# Patient Record
Sex: Male | Born: 1950 | Race: White | Hispanic: No | Marital: Married | Smoking: Never smoker
Health system: Southern US, Community
[De-identification: ages and names within clinical notes are randomized; demographics above are authoritative.]

## PROBLEM LIST (undated history)

## (undated) DIAGNOSIS — I10 Essential (primary) hypertension: Secondary | ICD-10-CM

## (undated) DIAGNOSIS — E785 Hyperlipidemia, unspecified: Secondary | ICD-10-CM

## (undated) DIAGNOSIS — G4730 Sleep apnea, unspecified: Secondary | ICD-10-CM

## (undated) DIAGNOSIS — M545 Low back pain, unspecified: Secondary | ICD-10-CM

## (undated) DIAGNOSIS — T7840XA Allergy, unspecified, initial encounter: Secondary | ICD-10-CM

## (undated) DIAGNOSIS — K219 Gastro-esophageal reflux disease without esophagitis: Secondary | ICD-10-CM

## (undated) DIAGNOSIS — F419 Anxiety disorder, unspecified: Secondary | ICD-10-CM

## (undated) DIAGNOSIS — F5104 Psychophysiologic insomnia: Secondary | ICD-10-CM

## (undated) DIAGNOSIS — M1990 Unspecified osteoarthritis, unspecified site: Secondary | ICD-10-CM

## (undated) DIAGNOSIS — N419 Inflammatory disease of prostate, unspecified: Secondary | ICD-10-CM

## (undated) DIAGNOSIS — H269 Unspecified cataract: Secondary | ICD-10-CM

## (undated) HISTORY — DX: Allergy, unspecified, initial encounter: T78.40XA

## (undated) HISTORY — DX: Sleep apnea, unspecified: G47.30

## (undated) HISTORY — DX: Psychophysiologic insomnia: F51.04

## (undated) HISTORY — PX: POLYPECTOMY: SHX149

## (undated) HISTORY — DX: Unspecified osteoarthritis, unspecified site: M19.90

## (undated) HISTORY — DX: Gastro-esophageal reflux disease without esophagitis: K21.9

## (undated) HISTORY — DX: Essential (primary) hypertension: I10

## (undated) HISTORY — DX: Low back pain, unspecified: M54.50

## (undated) HISTORY — DX: Inflammatory disease of prostate, unspecified: N41.9

## (undated) HISTORY — PX: WISDOM TOOTH EXTRACTION: SHX21

## (undated) HISTORY — DX: Unspecified cataract: H26.9

## (undated) HISTORY — DX: Low back pain: M54.5

## (undated) HISTORY — DX: Anxiety disorder, unspecified: F41.9

## (undated) HISTORY — PX: SEPTOPLASTY: SUR1290

## (undated) HISTORY — DX: Hyperlipidemia, unspecified: E78.5

## (undated) HISTORY — PX: COLONOSCOPY: SHX174

---

## 1998-05-25 ENCOUNTER — Emergency Department (HOSPITAL_COMMUNITY): Admission: EM | Admit: 1998-05-25 | Discharge: 1998-05-25 | Payer: Self-pay | Admitting: Emergency Medicine

## 2002-09-24 ENCOUNTER — Emergency Department (HOSPITAL_COMMUNITY): Admission: EM | Admit: 2002-09-24 | Discharge: 2002-09-24 | Payer: Self-pay | Admitting: Emergency Medicine

## 2002-10-10 ENCOUNTER — Encounter: Payer: Self-pay | Admitting: Orthopedic Surgery

## 2002-10-10 ENCOUNTER — Ambulatory Visit (HOSPITAL_COMMUNITY): Admission: RE | Admit: 2002-10-10 | Discharge: 2002-10-10 | Payer: Self-pay | Admitting: Orthopedic Surgery

## 2004-09-15 ENCOUNTER — Ambulatory Visit: Payer: Self-pay | Admitting: Gastroenterology

## 2004-09-16 ENCOUNTER — Encounter: Admission: RE | Admit: 2004-09-16 | Discharge: 2004-09-16 | Payer: Self-pay | Admitting: Gastroenterology

## 2004-09-16 ENCOUNTER — Encounter (INDEPENDENT_AMBULATORY_CARE_PROVIDER_SITE_OTHER): Payer: Self-pay

## 2004-09-16 ENCOUNTER — Encounter (INDEPENDENT_AMBULATORY_CARE_PROVIDER_SITE_OTHER): Payer: Self-pay | Admitting: *Deleted

## 2004-09-17 ENCOUNTER — Inpatient Hospital Stay (HOSPITAL_COMMUNITY): Admission: EM | Admit: 2004-09-17 | Discharge: 2004-09-19 | Payer: Self-pay

## 2004-09-17 ENCOUNTER — Ambulatory Visit: Payer: Self-pay | Admitting: Gastroenterology

## 2007-01-19 ENCOUNTER — Ambulatory Visit: Payer: Self-pay | Admitting: Gastroenterology

## 2007-02-17 ENCOUNTER — Ambulatory Visit: Payer: Self-pay | Admitting: Gastroenterology

## 2007-02-17 ENCOUNTER — Encounter (INDEPENDENT_AMBULATORY_CARE_PROVIDER_SITE_OTHER): Payer: Self-pay | Admitting: *Deleted

## 2007-02-17 LAB — HM COLONOSCOPY: HM Colonoscopy: NEGATIVE

## 2010-10-02 ENCOUNTER — Telehealth: Payer: Self-pay | Admitting: Internal Medicine

## 2010-10-03 ENCOUNTER — Other Ambulatory Visit: Payer: Self-pay | Admitting: Physician Assistant

## 2010-10-03 ENCOUNTER — Ambulatory Visit
Admission: RE | Admit: 2010-10-03 | Discharge: 2010-10-03 | Payer: Self-pay | Source: Home / Self Care | Attending: Internal Medicine | Admitting: Internal Medicine

## 2010-10-03 DIAGNOSIS — K2100 Gastro-esophageal reflux disease with esophagitis, without bleeding: Secondary | ICD-10-CM | POA: Insufficient documentation

## 2010-10-03 DIAGNOSIS — E785 Hyperlipidemia, unspecified: Secondary | ICD-10-CM | POA: Insufficient documentation

## 2010-10-03 DIAGNOSIS — R1319 Other dysphagia: Secondary | ICD-10-CM | POA: Insufficient documentation

## 2010-10-03 DIAGNOSIS — I10 Essential (primary) hypertension: Secondary | ICD-10-CM | POA: Insufficient documentation

## 2010-10-03 DIAGNOSIS — R1012 Left upper quadrant pain: Secondary | ICD-10-CM | POA: Insufficient documentation

## 2010-10-03 DIAGNOSIS — R109 Unspecified abdominal pain: Secondary | ICD-10-CM | POA: Insufficient documentation

## 2010-10-03 DIAGNOSIS — R1310 Dysphagia, unspecified: Secondary | ICD-10-CM | POA: Insufficient documentation

## 2010-10-03 DIAGNOSIS — R110 Nausea: Secondary | ICD-10-CM | POA: Insufficient documentation

## 2010-10-03 DIAGNOSIS — K219 Gastro-esophageal reflux disease without esophagitis: Secondary | ICD-10-CM | POA: Insufficient documentation

## 2010-10-03 DIAGNOSIS — A09 Infectious gastroenteritis and colitis, unspecified: Secondary | ICD-10-CM | POA: Insufficient documentation

## 2010-10-03 DIAGNOSIS — R197 Diarrhea, unspecified: Secondary | ICD-10-CM | POA: Insufficient documentation

## 2010-10-03 DIAGNOSIS — R1013 Epigastric pain: Secondary | ICD-10-CM | POA: Insufficient documentation

## 2010-10-03 LAB — CBC WITH DIFFERENTIAL/PLATELET
Basophils Absolute: 0 10*3/uL (ref 0.0–0.1)
Basophils Relative: 0.5 % (ref 0.0–3.0)
Eosinophils Absolute: 0.4 10*3/uL (ref 0.0–0.7)
Eosinophils Relative: 5.9 % — ABNORMAL HIGH (ref 0.0–5.0)
HCT: 48 % (ref 39.0–52.0)
Hemoglobin: 17 g/dL (ref 13.0–17.0)
Lymphocytes Relative: 15.8 % (ref 12.0–46.0)
Lymphs Abs: 1.2 10*3/uL (ref 0.7–4.0)
MCHC: 35.4 g/dL (ref 30.0–36.0)
MCV: 91.4 fl (ref 78.0–100.0)
Monocytes Absolute: 0.9 10*3/uL (ref 0.1–1.0)
Monocytes Relative: 11.6 % (ref 3.0–12.0)
Neutro Abs: 4.9 10*3/uL (ref 1.4–7.7)
Neutrophils Relative %: 66.2 % (ref 43.0–77.0)
Platelets: 233 10*3/uL (ref 150.0–400.0)
RBC: 5.25 Mil/uL (ref 4.22–5.81)
RDW: 12.8 % (ref 11.5–14.6)
WBC: 7.5 10*3/uL (ref 4.5–10.5)

## 2010-10-03 LAB — LIPASE: Lipase: 22 U/L (ref 11.0–59.0)

## 2010-10-03 LAB — BASIC METABOLIC PANEL
BUN: 15 mg/dL (ref 6–23)
CO2: 28 mEq/L (ref 19–32)
Calcium: 9.3 mg/dL (ref 8.4–10.5)
Chloride: 97 mEq/L (ref 96–112)
Creatinine, Ser: 1.2 mg/dL (ref 0.4–1.5)
GFR: 69.54 mL/min (ref >60.00)
Glucose, Bld: 92 mg/dL (ref 70–99)
Potassium: 4.4 mEq/L (ref 3.5–5.1)
Sodium: 134 mEq/L — ABNORMAL LOW (ref 135–145)

## 2010-10-06 ENCOUNTER — Encounter: Payer: Self-pay | Admitting: Physician Assistant

## 2010-10-07 ENCOUNTER — Telehealth (INDEPENDENT_AMBULATORY_CARE_PROVIDER_SITE_OTHER): Payer: Self-pay | Admitting: *Deleted

## 2010-10-07 ENCOUNTER — Encounter: Payer: Self-pay | Admitting: Physician Assistant

## 2010-10-27 ENCOUNTER — Ambulatory Visit
Admission: RE | Admit: 2010-10-27 | Discharge: 2010-10-27 | Payer: Self-pay | Source: Home / Self Care | Attending: Internal Medicine | Admitting: Internal Medicine

## 2010-10-30 NOTE — Progress Notes (Signed)
  Phone Note Outgoing Call Call back at Work Phone [phone]   Call placed by: Jesse Fall, RN Call placed to: Patient Summary of Call: Called to give patient stool culture results as per Mike Gip, PA. He continues to have diarrhea 3-4 times/day. Stools are watery. Denies any bleeding. Pain/cramps are better. Initial call taken by: Jesse Fall RN,  October 07, 2010 3:47 PM  Follow-up for Phone Call        HE SHOULD FINISH FLORASTOR AND FLAGYL, OK TO TAKE IMMODIUM  AS NEEDED FOR DIARRHEA-KEEP APPT WITH DR. PERRY ON 10/27/10 Follow-up by: Sammuel Cooper PA-c,  October 10, 2010 1:49 PM  Additional Follow-up for Phone Call Additional follow up Details #1::        Patient given recommendations per Mike Gip, PA. Additional Follow-up by: Jesse Fall RN,  October 10, 2010 1:56 PM

## 2010-10-30 NOTE — Miscellaneous (Signed)
Summary: CT SCAN ABDOMEN/PELVIS  Clinical Lists Changes  CT Pelvis W/CM - STATUS: Final  IMAGE                                     Perform Date: 20Dec05 17:33  Ordered By: Dorene Ar,        Ordered Date: 20Dec05 16:04  Facility: [facility]                              Department: CT  Service Report Text  [facility] Accession Number: [id number]    Clinical Data:  Abdominal pain.  Nausea and vomiting.   Technique:  100 cc Omnipaque 300.   CT ABDOMEN WITH CONTRAST (17:45 HOURS):   The liver, gallbladder, spleen, adrenal glands, and pancreas are   within normal limits.  The kidneys are within normal limits.  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  Nonspecific   air fluid levels are present in the colon. There is no obvious   inflammatory change of the colon. The small bowel is decompressed.   IMPRESSION:   1.  No evidence of acute intraabdominal pathology.   2.  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  IVC filter placement would be problematic.   CT PELVIS:   Mild sigmoid diverticulosis without diverticulitis is seen.  The   bladder is within normal limits. The prostate is prominent measuring   5.5 cm.  Negative free fluid.  Negative for adenopathy.   IMPRESSION:   1.  Mild prominence of the prostate gland.   2.  Otherwise no acute pelvic pathology.     Read By:  Jolaine Click,  M.D.   Released By:  Jolaine Click,  M.D.  Additional Information  External image : [id number]

## 2010-10-30 NOTE — Procedures (Signed)
Summary: EGD  Patient Name: Adam Villanueva, Adam Villanueva. MRN:  Procedure Procedures: Panendoscopy (EGD) CPT: 43235.  Personnel: Endoscopist: Ulyess Mort, MD.  Exam Location: Exam performed in Outpatient Clinic. Outpatient  Patient Consent: Procedure, Alternatives, Risks and Benefits discussed, consent obtained, from patient. Consent was obtained by the RN.  Indications Symptoms: Dyspepsia, Reflux symptoms  History  Current Medications: Patient is not currently taking Coumadin.  Pre-Exam Physical: Entire physical exam was normal.  Comments: Pt. history reviewed/updated, physical exam performed prior to initiation of sedation? Exam Exam Info: Maximum depth of insertion Duodenum, intended Duodenum. Patient position: on left side. Vocal cords visualized. Gastric retroflexion performed. Images taken. ASA Classification: II. Tolerance: good.  Sedation Meds: Patient assessed and found to be appropriate for moderate (conscious) sedation. Fentanyl 50 mcg. given IV. Versed 5 mg. given IV. Cetacaine Spray 2 sprays given aerosolized.  Monitoring: BP and pulse monitoring done. Oximetry used. Supplemental O2 given  Findings - HIATAL HERNIA: 2 cms. in length. ICD9: GERD: 530.81. - MUCOSAL ABNORMALITY: Body to Antrum. Erythematous mucosa. Granular mucosa. RUT done, results pending.  - MUCOSAL ABNORMALITY: Pyloric Sphincter to Jejunum. Granular mucosa.   Assessment Abnormal examination, see findings above.  Diagnoses: 530.81: GERD.   Events  Unplanned Intervention: No unplanned interventions were required.  Unplanned Events: There were no complications. Plans Medication(s): Continue current medications. PPI:   Patient Education: Patient given standard instructions for: Hiatal Hernia. Reflux. Mucosal Abnormality.  Disposition: After procedure patient sent to recovery. After recovery patient sent home.   Ulyess Mort, MD  This report was created from the original  endoscopy report, which was reviewed and signed by the above listed endoscopist.

## 2010-10-30 NOTE — Assessment & Plan Note (Signed)
Summary: Abdominal pain/Nausea/Diarrhea (Dr. Marina Goodell patient)   History of Present Illness Visit Type: Initial Visit Primary GI MD: Yancey Flemings MD Primary Provider: Burton Apley MD Chief Complaint: epigastric pain, nausea History of Present Illness:   Adam 60 YO Villanueva KNOWN TO DR. PERRY-LAST SEEN AT TIME OF COLONOSCOPY IN 5/08 WHICH WAS NORMAL HE ALSO HAD EGD AT THAT TIME SHOWING GASTRITIS,SMALL HIATAL HERNIA. HE HAS BEEN ON LONG TERM PPI RX FOR GERD SXS. HE SAYS HE STARTED HAVING SHOOTING PAINS IN HIS EPIGASTRIUM ABOUT 2 WEEKS AGO., ALONG WITH SOME DIARRHEA. HIS APPETITE HAS BEEN OK, BUT HAS SOME INTERMITTENT NAUSEA. NO VOMIITNG. . HE C/O CRAMPY ABDOMINAL PAIN, AND GENERALLY IS HAVING 4-5 LOOSE MALODOROUS STOOLS PER DAY. NO FEVR OR CHILLS.  HE ALSO C/O INCREASED REFLUX SXS, AND HAS HAD SOME SOLID FOOD DYSPHAGIA FOR A COUPLE MONTHS.  HE HAS BEEN ON ASA  A COUPLE DAYS PER WEEK, AND TAKES 2-3 ALEVE MOST DAYS FOR BACK PAIN. HE ALSO TOOK A COURSE OF CIPRO FOR WHAT SOUNDS LIKE EPIDYMITIS ABOUT 2 1/2 WEEKS AGO, AND SXS STARTED AFTER THAT.   GI Review of Systems    Reports abdominal pain, belching, bloating, chest pain, heartburn, and  nausea.     Location of  Abdominal pain: epigastric area.    Denies acid reflux, dysphagia with liquids, dysphagia with solids, loss of appetite, vomiting, vomiting blood, weight loss, and  weight gain.      Reports diarrhea.     Denies anal fissure, black tarry stools, change in bowel habit, constipation, diverticulosis, fecal incontinence, heme positive stool, hemorrhoids, irritable bowel syndrome, jaundice, light color stool, liver problems, rectal bleeding, and  rectal pain.    Current Medications (verified): 1)  Atenolol 50 Mg Tabs (Atenolol) .Marland Kitchen.. 1 By Mouth Once Daily 2)  Hydrochlorothiazide 25 Mg Tabs (Hydrochlorothiazide) .Marland Kitchen.. 1 By Mouth Once Daily 3)  Lisinopril 40 Mg Tabs (Lisinopril) .Marland Kitchen.. 1 By Mouth Once Daily 4)  Protonix 40 Mg Tbec (Pantoprazole  Sodium) .Marland Kitchen.. 1 By Mouth Once Daily 5)  Simvastatin 20 Mg Tabs (Simvastatin) .Marland Kitchen.. 1 By Mouth Once Daily 6)  Amitriptyline Hcl 50 Mg Tabs (Amitriptyline Hcl) .Marland Kitchen.. 1 By Mouth Once Daily 7)  Metoclopramide Hcl 10 Mg Tabs (Metoclopramide Hcl) .Marland Kitchen.. 1 By Mouth Two Times A Day 8)  Aleve 220 Mg Tabs (Naproxen Sodium) .... As Needed 9)  Fish Oil 1000 Mg Caps (Omega-3 Fatty Acids) .Marland Kitchen.. 1 By Mouth Once Daily  Allergies (verified): No Known Drug Allergies  Past History:  Past Medical History: Anxiety Disorder Arthritis Hyperlipidemia Hypertension  Past Surgical History: Back Surgery COLONOSCOPY 5/08 DR PERRY  Family History: Family History of Esophageal Cancer: brother Family History of Heart Disease: mother, grandparents No FH of Colon Cancer:  Social History: Patient has never smoked.  Alcohol Use - yes 1 per day Daily Caffeine Use 3 per day Illicit Drug Use - no Patient does not get regular exercise.  Smoking Status:  never Drug Use:  no Does Patient Exercise:  no  Review of Systems  The patient denies allergy/sinus, anemia, anxiety-new, arthritis/joint pain, back pain, blood in urine, breast changes/lumps, change in vision, confusion, cough, coughing up blood, depression-new, fainting, fatigue, fever, headaches-new, hearing problems, heart murmur, heart rhythm changes, itching, menstrual pain, muscle pains/cramps, night sweats, nosebleeds, pregnancy symptoms, shortness of breath, skin rash, sleeping problems, sore throat, swelling of feet/legs, swollen lymph glands, thirst - excessive , urination - excessive , urination changes/pain, urine leakage, vision changes, and voice change.  SEE HPI  Vital Signs:  Patient profile:   60 year old Villanueva Height:      70 inches Weight:      187 pounds BMI:     26.93 Pulse rate:   66 / minute Pulse rhythm:   regular BP sitting:   126 / 70  (left arm)  Vitals Entered By: Chales Abrahams CMA Duncan Dull) (October 03, 2010 8:57 AM)  Physical  Exam  General:  Well developed, well nourished, no acute distress. Head:  Normocephalic and atraumatic. Eyes:  PERRLA, no icterus. Lungs:  Clear throughout to auscultation. Heart:  Regular rate and rhythm; no murmurs, rubs,  or bruits. Abdomen:  SOFT, TENDER MILDY ACROSS LOWER ABDOMEN,BS HYPERACTIVE, NO GUARDING, NO MASS OR HSM,BS+ Rectal:  NOT DONE Neurologic:  Alert and  oriented x4;  grossly normal neurologically. Psych:  Alert and cooperative. Normal mood and affect.   Impression & Recommendations:  Problem # 1:  DIARRHEA (ICD-787.91) Assessment New 60 YO Villanueva WITH 2 WEEK HX OF MULTIPLE GI SXS WITH ABDOMINAL CRAMPING, DIARRHEA, NAUSEA, INCREASED REFLUX SXS-ALL AFTER TAKING A COURSE OF CIPRO. R/O C.DIFF.  LABS AS BELOW STOOL STUDIS A S BELOW START FLORASTOR TWICE DAILY START FLAGYL 250 MG 4 TIMES DAILY X 2 WEEKS PENDING CDIFF PCR. 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 ROV WITH MYSELF OR DR. PERRY IN 2 WEEKS  Problem # 3:  HYPERTENSION (ICD-401.9) Assessment: Comment Only  Problem # 4:  HYPERLIPIDEMIA (ICD-272.4) Assessment: Comment Only  Other Orders: T-Culture, Stool (87045/[id number]) T-C diff by PCR (54098) T-Fecal WBC (11914-78295) TLB-CBC Platelet - w/Differential (85025-CBCD) TLB-BMP (Basic Metabolic Panel-BMET) (80048-METABOL) TLB-Lipase (83690-LIPASE)  Patient Instructions: 1)  Please go to lab, basement level. 2)  Sent prescriptions to [facility] [address] for [employer] and Flagyl. 3)  We gave you Nexium samples, take 1 twice daily for 14 days. 4)  We made you a follow up appointment with Dr. Marina Goodell for 10-27-2010. 5)  Appointment card given. 6)  Call us at [phone]  if you need to be seen sooner. 7)  Copy sent to : Burton Apley, MD 8)  The medication list was reviewed and reconciled.  All changed / newly prescribed medications were explained.  A complete medication list was provided to the patient / caregiver. Prescriptions: FLORASTOR 250 MG CAPS (SACCHAROMYCES BOULARDII) Take 1 tab twice daily x 14 days  #28 x 0   Entered by:   Lowry Ram NCMA   Authorized by:   Sammuel Cooper PA-c   Signed by:   Lowry Ram NCMA on 10/03/2010   Method used:   Electronically to        [facility]  [address] # [id number]* (retail)       [address]       [place], [place]  [address]       Ph: [phone] or [phone]       Fax: [phone]   RxID:   [id number] FLAGYL 250 MG TABS (METRONIDAZOLE) Take 1 tab 4 times daily x 14 days  #56 x 0   Entered by:   Lowry Ram NCMA   Authorized by:  Brieonna Crutcher S Ottis Sarnowski PA-c   Signed by:   Lowry Ram NCMA on 10/03/2010   Method used:   Electronically to        [facility] # [id number]* (retail)       [address]       [place], [place]  [address]       Ph: [phone] or [phone]       Fax: [phone]   RxID:   [id number]

## 2010-10-30 NOTE — Miscellaneous (Signed)
Summary: Abdomen xray 2 view  Clinical Lists Changes     DG Abd 2V. - STATUS: Final  IMAGE                                     Perform Date: 20Dec05 16:22  Ordered By: Dorene Ar,        Ordered Date: 20Dec05 16:13  Facility: [facility]                              Department: DG  Service Report Text  DRI Accession Number: [id number]    Clinical Data: Upper abdominal pain, nausea and vomiting for one week.    ABDOMEN - 2 VIEW    Comparison:  None.    Findings:  Multiple air-fluid levels in normal caliber right and   transverse   colon. Air-fluid levels in normal caliber colon. CT oral contrast in   the right   and transverse colon and small bowel. No bowel dilatation seen and no   gross   evidence of free peritoneal air. Minimal levoconvex lumbar scoliosis.    IMPRESSION    Multiple air-fluid levels in normal caliber colon. This could be due   to the   ingested CT oral contrast. This can also be seen with gastroenteritis.    Read By:  Darrol Angel,  M.D.   Released By:  Darrol Angel,  M.D.  Additional Information  External image : [id number]

## 2010-10-30 NOTE — Progress Notes (Signed)
Summary: Doc of the day  Phone Note Call from Patient Call back at Work Phone [phone]   Caller: Patient Call For: Dr. Leone Payor Reason for Call: Talk to Nurse Summary of Call: Abd pain and nausea...requesting sooner appt. than next avail Initial call taken by: Karna Christmas,  October 02, 2010 3:25 PM  Follow-up for Phone Call        Spoke with patient and he is having problems with gas, abdominal pain and nausea. States that he was a patient of Dr. Victorino Dike years ago.Appointment made for patient to see Mike Gip, [place] 10/03/10 @9am . Follow-up by: Selinda Michaels RN,  October 02, 2010 3:50 PM

## 2010-11-05 NOTE — Assessment & Plan Note (Signed)
Summary: F/U ABD pain, nausea, saw Amy Esterwood PA-C   History of Present Illness Visit Type: Follow-up Visit Primary GI MD: Yancey Flemings MD Primary Provider: Dr Burton Apley Chief Complaint: Patient here for f/u nausea and abdominal pain after seeing Amy Esterwood, PA-C. He states that he is doing somewhat better but is now experiencing epigastric abdominal tenderness which radiates to the left. He also c/o increasing cough which is a new symptom x 2 weeks. History of Present Illness:   60 year old with a history of hypertension, hyperlipidemia, anxiety disorder, and GERD. Seen previously by Dr. Victorino Dike and underwent colonoscopy and upper endoscopy in May of 2008. The examinations were unremarkable. She more recently by the physician assistant for abdominal discomfort, diarrhea, and dysphagia. The diarrhea was the chief complaint. Multiple stool studies were negative except for a positive lactoferrin. CBC, comprehensive metabolic panel, and lipase unremarkable. His Protonix was changed to Nexium. He thinks that this has helped his epigastric complaints. His dysphagia has resolved. He was also street with Florastor and Flagyl. His diarrhea resolved 3 days ago. No other active complaints with regards to his GI system. He does mention problems with cough and shortness of breath as well as some rib pain. He is concerned about a side effect of his antihypertensive medication. I told him to see his primary provider, Dr. Su Hilt, regarding please complaints, ASAP. He understood.   GI Review of Systems    Reports abdominal pain and  bloating.     Location of  Abdominal pain: epigastric area.    Denies acid reflux, belching, chest pain, dysphagia with liquids, dysphagia with solids, heartburn, loss of appetite, nausea, vomiting, vomiting blood, weight loss, and  weight gain.        Denies anal fissure, black tarry stools, change in bowel habit, constipation, diarrhea, diverticulosis, fecal  incontinence, heme positive stool, hemorrhoids, irritable bowel syndrome, jaundice, light color stool, liver problems, rectal bleeding, and  rectal pain.    Current Medications (verified): 1)  Atenolol 50 Mg Tabs (Atenolol) .Marland Kitchen.. 1 By Mouth Once Daily 2)  Hydrochlorothiazide 25 Mg Tabs (Hydrochlorothiazide) .Marland Kitchen.. 1 By Mouth Once Daily 3)  Lisinopril 40 Mg Tabs (Lisinopril) .Marland Kitchen.. 1 By Mouth Once Daily 4)  Protonix 40 Mg Tbec (Pantoprazole Sodium) .Marland Kitchen.. 1 By Mouth Once Daily 5)  Simvastatin 20 Mg Tabs (Simvastatin) .Marland Kitchen.. 1 By Mouth Once Daily 6)  Amitriptyline Hcl 50 Mg Tabs (Amitriptyline Hcl) .Marland Kitchen.. 1 By Mouth Once Daily 7)  Metoclopramide Hcl 10 Mg Tabs (Metoclopramide Hcl) .Marland Kitchen.. 1 By Mouth Two Times A Day 8)  Tylenol Extra Strength 500 Mg Tabs (Acetaminophen) .... Take 2 Tablets By Mouth As Needed (Has Been Taking 4 Daily X 3 Weeks) 9)  Fish Oil 1000 Mg Caps (Omega-3 Fatty Acids) .Marland Kitchen.. 1 By Mouth Once Daily 10)  Nexium 40 Mg Cpdr (Esomeprazole Magnesium) .... Take 1 Cap 30 Min Prior To Breakfast and Dinner X 14 Days  Allergies (verified): No Known Drug Allergies  Past History:  Past Medical History: Anxiety Disorder Arthritis Hyperlipidemia Hypertension Back Pain  Past Surgical History: COLONOSCOPY and EGD 5/08 Dr. Corinda Gubler  nasal surgery-due to broken nose  Family History: Family History of Esophageal Cancer: brother Family History of Heart Disease: mother, grandparents Family History of Colon Cancer: Uncle  Social History: Patient has never smoked.  Alcohol Use - yes 1 per day (currently holding) Daily Caffeine Use 3 per day Illicit Drug Use - no Patient does not get regular exercise.   Review of Systems  The patient complains of allergy/sinus, anxiety-new, arthritis/joint pain, back pain, cough, fatigue, and shortness of breath.  The patient denies anemia, blood in urine, breast changes/lumps, change in vision, confusion, coughing up blood, depression-new, fainting,  fever, headaches-new, hearing problems, heart murmur, heart rhythm changes, itching, menstrual pain, muscle pains/cramps, night sweats, nosebleeds, pregnancy symptoms, skin rash, sleeping problems, sore throat, swelling of feet/legs, swollen lymph glands, thirst - excessive , urination - excessive , urination changes/pain, urine leakage, vision changes, and voice change.    Vital Signs:  Patient profile:   60 year old male Height:      70 inches Weight:      192.38 pounds BMI:     27.70 BSA:     2.05 Pulse rate:   76 / minute Pulse rhythm:   regular BP sitting:   120 / 82  (left arm)  Vitals Entered By: Lamona Curl CMA (AAMA) (October 27, 2010 10:00 AM)  Physical Exam  General:  Well developed, well nourished, no acute distress. Head:  Normocephalic and atraumatic. Eyes:  PERRLA, no icterus. Mouth:  No deformity or lesions. Neck:  Supple; no masses or thyromegaly. Chest Wall:  Symmetrical,  no deformities . Lungs:  Clear throughout to auscultation. Heart:  Regular rate and rhythm; no murmurs, rubs,  or bruits. Abdomen:  Soft, nontender and nondistended. No masses, hepatosplenomegaly or hernias noted. Normal bowel sounds. Msk:  Symmetrical with no gross deformities. Normal posture. Pulses:  Normal pulses noted. Extremities:  no edema Neurologic:  alert and oriented Skin:  Intact without significant lesions or rashes. Psych:  Alert and cooperative. Anxious   Impression & Recommendations:  Problem # 1:  DIARRHEA (ICD-787.91) most likely self-limited viral gastroenteritis. Empirically treated with Florastor and Flagyl. The problem has resolved. Followup p.r.n..  Problem # 2:  GERD (ICD-530.81) GERD. No active symptoms on current PPI. Negative prior EGD 2008  Plan:  #1. Reflux precautions #2. Continue PPI to control symptoms #3. Return should dysphagia recur. #4. Advised him that long-term clopamide could result in significant neurologic side effects. I doubt there is  any benefit for him taking this agent. He was prescribed by his PCP. He can discuss this further with Dr. Su Hilt, but I would favor stopping the medication  Problem # 3:  ABDOMINAL PAIN, EPIGASTRIC (ICD-789.06) resolved  Problem # 4:  SCREENING COLORECTAL-CANCER (ICD-V76.51) negative screening exam 2008. Due for routine followup around 2018  Patient Instructions: 1)  Samples of Nexium given to patient. 2)  Please schedule a follow-up appointment as needed.  3)  The medication list was reviewed and reconciled.  All changed / newly prescribed medications were explained.  A complete medication list was provided to the patient / caregiver. 4)  Copy sent to : Dr Burton Apley

## 2011-02-13 NOTE — H&P (Signed)
NAMEBARTH, TRELLA               ACCOUNT NO.:  [id number]   MEDICAL RECORD NO.:  [id number]          PATIENT TYPE:  EMS   LOCATION:  ED                           FACILITY:  [facility]   PHYSICIAN:  Jackie Plum, M.D.DATE OF BIRTH:  09/03/1951   DATE OF ADMISSION:  09/17/2004  DATE OF DISCHARGE:                                HISTORY & PHYSICAL   HISTORY OF PRESENT ILLNESS:  The patient was referred by Dr. Corinda Gubler of  [facility] for management of hypokalemia.  According to the patient, he has  been having some problems with nausea without any vomiting for about a week  now.  He has not been able to take anything and he has been having some  soreness of his abdomen as well as a feeling of bloating of his abdomen as  well.  He also gives history of some constipation and decreased appetite  with resultant in about 7-8 pounds weight loss.  However, he indicated that  his appetite is getting better today and was able to empty his bowels.  He  denies any history of headaches, dizziness, or visual changes.  He denies  any extremity weakness or any other neurologic symptomatology.  He denies  any dysuria or frequency of micturition.  He denies any fever or chills.  No  history of cough or sputum production.  He denies any heart burn at this  very moment.  No hematemesis or bright red blood per rectum or melena.   PAST MEDICAL HISTORY:  Notable for:  1.  History of hiatal hernia.  2.  Hypertension.  3  Dyslipidemia.   CURRENT MEDICINES:  1.  Atenolol 25 mg daily.  2.  Elavil 50 mg daily.  3.  Nexium 40 mg daily.  4.  Reglan 10 mg four times daily.  5.  Lisinopril/hydrochlorothiazide 20 mg/25 mg one tablet daily.  6.  He also takes some Phenergan.   FAMILY HISTORY:  Negative for any GI problems or diabetes.   SOCIAL HISTORY:  The patient works with [employer].  He does not smoke  cigarettes nor drink alcohol.   REVIEW OF SYSTEMS:  Significant positives and negatives are as  noted.  Other  review of systems was unremarkable.   PHYSICAL EXAMINATION ON ADMISSION:  VITAL SIGNS:  Blood pressure 135/94,  pulse 83, respirations 18, temperature 97.0, O2 saturation of 98% on room  air.  GENERAL:  Notable for middle-aged man, lying down on the stretcher.  He was  not in acute cardiopulmonary distress.  He looked slightly weak.  HEENT:  Normocephalic, atraumatic.  Pupils were equal, round, reacted to  light.  Extraocular movements were intact.  Oropharynx was slightly dry.  No  oropharyngeal injection or erythema.  NECK:  Supple, no JVD.  LUNGS:  Clear to auscultation.  CARDIAC:  Regular rate and rhythm, no gallops or murmur.  ABDOMEN:  Soft, nontender, bowel sounds were present, they were normal  active.  EXTREMITIES:  No edema, no cyanosis.  NEUROLOGIC:  No acute focal deficit.  He was alert and oriented x3.   LABORATORY WORK:  The patient had lab work in the doctor's office from this  morning.  Wbc count of 6.3, hemoglobin 17.6, hematocrit 50.9, MCV 90.6,  platelet count 312.  Sodium 132, potassium 2.7, chloride 93, CO2 32, glucose  115, BUN 6, creatinine 1.1.  Total bilirubin, alkaline phosphatase, albumin,  and calcium were within normal limits.  Amylase and lipase done on December  19 were within normal limits.  CT scan of the abdomen done on December 20  was notable for absence of any evidence of acute intraabdominal pathology.  Mild sigmoid diverticulosis without diverticulitis.  Mild prominence of the  prostate gland.   IMPRESSION:  1.  Intractable nausea of unknown etiology.  The patient is currently      following with gastroenterology.  2.  Hypokalemia and hyponatremia which may be related decreased salt intake      from diagnosis #1.   The patient will be admitted for observation purposes only.  He will be  started on saline IV and the hypokalemia will be treated appropriately with  KCl runs and the level shall be checked.  Will obtain UA and a  BMET in the  morning as well.  The patient will be following back with [facility] when  released from the hospital for further workup regarding his nausea.  At this  moment, the etiology of his nausea is unclear.  The patient denies any heart  burn and it is unclear if this is related to his hiatal hernia and possibly  GERD.     [facility]   GO/MEDQ  D:  09/17/2004  T:  09/17/2004  Job:  [id number]   cc:   Molly Maduro A. Nicholos Johns, M.D.  [address]  [place]  [place] [address]  Fax: [phone]   Ulyess Mort, M.D. [facility]

## 2011-02-13 NOTE — Discharge Summary (Signed)
NAMEEMRE, STOCK               ACCOUNT NO.:  [id number]   MEDICAL RECORD NO.:  [id number]          PATIENT TYPE:  INP   LOCATION:  0342                         FACILITY:  [facility]   PHYSICIAN:  Theone Stanley, MD   DATE OF BIRTH:  07/06/1951   DATE OF ADMISSION:  09/17/2004  DATE OF DISCHARGE:  09/19/2004                                 DISCHARGE SUMMARY   ADMISSION DIAGNOSES:  1.  Hypokalemia.  2.  Hyponatremia.  3.  History of hiatal hernia.  4.  Hypertension.  5.  Dyslipidemia.  6.  Gastroesophageal reflux disease.  7.  Nausea.   DISCHARGE DIAGNOSES:  1.  Electrolyte abnormality secondary to poor oral intake and continued      intake of his medication of hydrochlorothiazide.  2.  Intractable nausea, resolved.  3.  History of hiatal hernia.  4.  Hypertension.  5.  Dyslipidemia.  6.  Gastroesophageal reflux disease.   PROCEDURE:  Diagnostic tests: None.   CONSULTATIONS:  None.   HISTORY OF PRESENT ILLNESS:  Mr. Bessinger is a 60 year old Caucasian gentleman  who presented to Dr. Corinda Gubler of [facility] for management of hypokalemia.  According to the patient he was having some problems with nausea without any  vomiting for about a week or so.  He has not been able to take anything and  has been having some soreness of his abdomen as well as a feeling of  bloating of his abdomen as well.  He has had a history of some constipation  and decreased appetite with resultant 70-pound weight loss.  He presented to  the office with decreasing levels of potassium and weakness.  At that point,  he was admitted to the hospital.   HOSPITAL COURSE:  His potassium on admission was 2.7, sodium was down to  130.  Through his stay, the patient was provided with supplemental  potassium, magnesium, and eventually his potassium went up to 3.3.  His  sodium came up to 138.  His hydrochlorothiazide was discontinued and the  patient was instructed that if he at any point in time had poor oral  intake,  to inform his primary care physician and possibly stop his  hydrochlorothiazide after discussing it with his primary care physician.  Since his potassium was only 3.3 on discharge, the patient was given  supplemental oral potassium on his discharge.  The patient was well rested  and felt much stronger and was stable on his discharge.   DISCHARGE MEDICATIONS:  1.  Potassium chloride 20 mEq one p.o. b.i.d. for an additional two weeks.  2.  Atenolol 25 mg one p.o. daily.  3.  Elavil 50 mg one p.o. at night.  4.  Protonix 80 mg one p.o. daily.  5.  Reglan 10 mg one p.o. q.i.d.  6.  Lisinopril/hydrochlorothiazide 20/25 one p.o. daily.  7.  Tums two tabs p.o. b.i.d. for one month.   FOLLOW UP:  The patient is to follow up with Dr. Azucena Kuba in one to two weeks  with a BMP at Dr. Norvel Richards office.     [employer]  AEJ/MEDQ  D:  09/19/2004  T:  09/22/2004  Job:  [id number]   cc:   Rosanne Gutting  [address]  [place]  [place] [address]  Fax: [phone]

## 2011-02-13 NOTE — Assessment & Plan Note (Signed)
[facility]                         GASTROENTEROLOGY OFFICE NOTE   RITCHARD, Adam Villanueva                      MRN:          [id number]  DATE:01/19/2007                            DOB:          August 07, 1951    PRIMARY CARE PHYSICIAN:  Dr. Zenaida Deed.   The patient comes in on April 23, says he is here to discuss colonoscopy  evaluation, he has no GI complaints.   The patient says he has no GI symptoms, and he also, as indicated above,  said he had no GI complaints, but when I asked him about reflux,  heartburn, dysphagia, and so on, he said he has been having this for  years, and has taken a proton pump inhibitor for years to control his  symptoms.  He was on Nexium, now he is on Protonix 20 mg daily.  He says  he gets lots of reflux and burning with some fullness in his stomach.  He said that I did an endoscopic examination on him years ago in the  1980s, so it is obvious he has had symptoms almost 25-30 years.  At that  time, he was found to have a small hiatal hernia.  I do not have that  report available to me at this time.  When I saw him in 2005, he was  having some abdominal discomfort, and I felt like there was a  possibility of some pancreatitis versus gallbladder disease or peptic  ulcer disease.  A CT scan was ordered.  It was read as totally normal  except for retro aortic left renal vein of questionable significance was  noted.  There were some mild problems in the prostate gland on CT of his  pelvis.  I suggested that he have a colonoscopic examination.  I saw him  after he had the CT and he was still feeling some nausea and stomach  discomfort.  He __________ at the time.  His potassium was 2.7, sodium  132, hemoglobin was 17.6.  He was admitted to the hospital and given  some IV fluids and seen by the [facility].   The patient's other medical problems are hiatal hernia, hypertension,  hyperlipidemia, some anxiety and  depression.   MEDICATIONS:  1. Amitriptyline.  2. Protonix.  3. Metoclopramide.  4. Atenolol.  5. Lisinopril.  6. Hydrochlorothiazide.  7. Simvastatin.  8. Promethazine.  9. __________ at times and Aleve at times.  He says he takes Aleve      fairly frequently sometimes.   I do not have the discharge summary from when he was at the hospital at  that time.  I have not seen him since.  Now he comes in saying that he  needs a colonoscopic examination, has been referred from Dr. Zenaida Deed, and he is nervous about it.  He says he had a friend whose  father died from perforation from a colonoscopic examination.  I tried  to reassure him that after all these years I have never perforated  anyone's intestinal tract, either involving the upper gastrointestinal  tract or the colon with an endoscope,  and even after multiple esophageal  dilatations and removing multiple polyps.   PHYSICAL EXAMINATION:  His weight was 195, blood pressure 122/90, pulse  72 and regular.  NECK:  Veins unremarkable.   IMPRESSION:  1. Healthy-appearing gentleman who is quite anxious and demonstrates      underlying anxiety and depression.  2. Hypertension.  3. Hyperlipidemia.  4. Gastroesophageal reflux disease with gastritis.  5. Family history of colon cancer with his uncle having had colon      cancer.   RECOMMENDATIONS:  Schedule him for an upper endoscopy and colonoscopic  exam at the same time.  In the meantime, continue on his Protonix and  other medications as needed.     Ulyess Mort, MD  Electronically Signed    SML/MedQ  DD: 01/19/2007  DT: 01/19/2007  Job #: [id number]   cc:   Antony Madura, M.D.

## 2012-01-19 ENCOUNTER — Ambulatory Visit: Payer: Self-pay | Admitting: Family Medicine

## 2012-01-26 ENCOUNTER — Encounter: Payer: Self-pay | Admitting: Family Medicine

## 2012-01-26 ENCOUNTER — Ambulatory Visit (INDEPENDENT_AMBULATORY_CARE_PROVIDER_SITE_OTHER): Payer: Managed Care, Other (non HMO) | Admitting: Family Medicine

## 2012-01-26 VITALS — BP 130/84 | HR 80 | Temp 97.7°F | Resp 12 | Ht 69.5 in | Wt 191.0 lb

## 2012-01-26 DIAGNOSIS — I10 Essential (primary) hypertension: Secondary | ICD-10-CM

## 2012-01-26 DIAGNOSIS — Z0000 Encounter for general adult medical examination without abnormal findings: Secondary | ICD-10-CM

## 2012-01-26 DIAGNOSIS — F5104 Psychophysiologic insomnia: Secondary | ICD-10-CM | POA: Insufficient documentation

## 2012-01-26 DIAGNOSIS — K219 Gastro-esophageal reflux disease without esophagitis: Secondary | ICD-10-CM

## 2012-01-26 DIAGNOSIS — E785 Hyperlipidemia, unspecified: Secondary | ICD-10-CM

## 2012-01-26 DIAGNOSIS — G4700 Insomnia, unspecified: Secondary | ICD-10-CM

## 2012-01-26 LAB — PSA: PSA: 2.6 ng/mL (ref 0.10–4.00)

## 2012-01-26 LAB — POCT URINALYSIS DIPSTICK
Blood, UA: NEGATIVE
Glucose, UA: NEGATIVE
Nitrite, UA: NEGATIVE
Protein, UA: NEGATIVE
Spec Grav, UA: 1.01
Urobilinogen, UA: 0.2

## 2012-01-26 LAB — HEPATIC FUNCTION PANEL
AST: 26 U/L (ref 0–37)
Albumin: 4.2 g/dL (ref 3.5–5.2)
Total Protein: 6.7 g/dL (ref 6.0–8.3)

## 2012-01-26 LAB — CBC WITH DIFFERENTIAL/PLATELET
Basophils Absolute: 0 10*3/uL (ref 0.0–0.1)
Lymphs Abs: 1.1 10*3/uL (ref 0.7–4.0)
Monocytes Absolute: 0.4 10*3/uL (ref 0.1–1.0)
Neutro Abs: 2.9 10*3/uL (ref 1.4–7.7)
Neutrophils Relative %: 61.5 % (ref 43.0–77.0)
RBC: 5.06 Mil/uL (ref 4.22–5.81)
WBC: 4.7 10*3/uL (ref 4.5–10.5)

## 2012-01-26 LAB — LIPID PANEL
HDL: 30.9 mg/dL — ABNORMAL LOW (ref >39.00)
Total CHOL/HDL Ratio: 5
VLDL: 53.8 mg/dL — ABNORMAL HIGH (ref 0.0–40.0)

## 2012-01-26 LAB — BASIC METABOLIC PANEL
BUN: 12 mg/dL (ref 6–23)
CO2: 28 mEq/L (ref 19–32)
Glucose, Bld: 98 mg/dL (ref 70–99)
Potassium: 3.8 mEq/L (ref 3.5–5.1)
Sodium: 135 mEq/L (ref 135–145)

## 2012-01-26 LAB — TSH: TSH: 0.77 u[IU]/mL (ref 0.35–5.50)

## 2012-01-26 MED ORDER — TRAZODONE HCL 50 MG PO TABS: 50.0000 mg | ORAL_TABLET | Freq: Every day | ORAL | Status: DC

## 2012-01-26 MED ORDER — PANTOPRAZOLE SODIUM 40 MG PO TBEC: 40.0000 mg | DELAYED_RELEASE_TABLET | Freq: Every day | ORAL | Status: DC

## 2012-01-26 MED ORDER — HYDROCHLOROTHIAZIDE 25 MG PO TABS: 25.0000 mg | ORAL_TABLET | Freq: Every day | ORAL | Status: DC

## 2012-01-26 MED ORDER — ATENOLOL 50 MG PO TABS: 50.0000 mg | ORAL_TABLET | Freq: Every day | ORAL | Status: DC

## 2012-01-26 MED ORDER — SIMVASTATIN 20 MG PO TABS: 20.0000 mg | ORAL_TABLET | Freq: Every evening | ORAL | Status: DC

## 2012-01-26 MED ORDER — LISINOPRIL 40 MG PO TABS: 40.0000 mg | ORAL_TABLET | Freq: Every day | ORAL | Status: DC

## 2012-01-26 NOTE — Patient Instructions (Signed)
Taper off amitriptyline by taking one half at night for one week and then discontinue and then...Marland KitchenMarland KitchenMarland Kitchen Start Trazodone 50 mg one at night. See if you can taper off metoclopramide after stopping amitriptyline.

## 2012-01-26 NOTE — Progress Notes (Signed)
Subjective:    Patient ID: Adam Villanueva, male    DOB: Jul 29, 1951, 61 y.o.   MRN: [id number]  HPI  New patient to establish care. Past medical history reviewed. He has history of GERD, hypertension, hyperlipidemia, recurrent prostatitis, chronic insomnia, and some BPH symptoms. He is seeing urologist recently and started on Rapaflo which he thought helped somewhat. He apparently had some chronic dyspepsia and has been on metoclopramide for some time but only takes this sporadically. He is on amitriptyline for sleep difficulties. Has tried tapering off a couple times and has had insomnia each time.  He takes simvastatin for hyperlipidemia. No history of CAD or peripheral vascular disease. GERD symptoms controlled with pantoprazole. Takes 3 drug regimen for hypertension. He would like to discontinue some his medications. Denies any specific side effects though.  No prior surgeries. Nonsmoker. Only occasional alcohol use. Patient is divorced. No children.  Family history significant for mother with hypertension/ hyperlipidemia. Mom had coronary disease at age 52. Both parents with hypertension.  Past Medical History  Diagnosis Date  . Arthritis   . Allergy   . Hypertension   . Hyperlipidemia   . Prostatitis    History reviewed. No pertinent past surgical history.  reports that he has never smoked. He does not have any smokeless tobacco history on file. His alcohol and drug histories not on file. family history includes Arthritis in his father, maternal grandfather, maternal grandmother, mother, paternal grandfather, and paternal grandmother; Hyperlipidemia in his mother; and Hypertension in his father and mother. No Known Allergies   Review of Systems  Constitutional: Negative for fever, chills, activity change, appetite change, fatigue and unexpected weight change.  HENT: Negative for ear pain, congestion, sore throat and trouble swallowing.   Eyes: Negative for pain and visual  disturbance.  Respiratory: Negative for cough, shortness of breath and wheezing.   Cardiovascular: Negative for chest pain and palpitations.  Gastrointestinal: Negative for nausea, vomiting, abdominal pain, diarrhea, constipation, blood in stool, abdominal distention and rectal pain.  Genitourinary: Negative for dysuria, hematuria and testicular pain.  Musculoskeletal: Negative for joint swelling and arthralgias.  Skin: Negative for rash.  Neurological: Negative for dizziness, syncope and headaches.  Hematological: Negative for adenopathy.  Psychiatric/Behavioral: Negative for confusion and dysphoric mood.       Objective:   Physical Exam  Constitutional: He is oriented to person, place, and time. He appears well-developed and well-nourished. No distress.  HENT:  Head: Normocephalic and atraumatic.  Right Ear: External ear normal.  Left Ear: External ear normal.  Mouth/Throat: Oropharynx is clear and moist.  Eyes: Conjunctivae and EOM are normal. Pupils are equal, round, and reactive to light.  Neck: Normal range of motion. Neck supple. No thyromegaly present.  Cardiovascular: Normal rate, regular rhythm and normal heart sounds.   No murmur heard. Pulmonary/Chest: Effort normal and breath sounds normal. No respiratory distress. He has no wheezes. He has no rales.  Abdominal: Soft. Bowel sounds are normal. He exhibits no distension and no mass. There is no tenderness. There is no rebound and no guarding.  Genitourinary:       Sees urologist so deferred.  Musculoskeletal: He exhibits no edema.  Lymphadenopathy:    He has no cervical adenopathy.  Neurological: He is alert and oriented to person, place, and time. He displays normal reflexes. No cranial nerve deficit.  Skin: No rash noted.  Psychiatric: He has a normal mood and affect.          Assessment & Plan:  #  1 complete physical. Obtain screening labs. Patient thinks tetanus less than 10 years ago. Discussed diet and  exercise.   Colonoscopy up to date. #2 history of hypertension stable refill medications for one year  #3 history of chronic insomnia. Try tapering off the amitriptyline secondary to anticholinergic side effects. Will try tapering off amitriptyline and try trazodone 50 mg each bedtime which should have less anticholinergic effect #4 history of some chronic dyspepsia and question gastroparesis. We'll see if tapering off amitriptyline helps.

## 2012-01-27 LAB — LDL CHOLESTEROL, DIRECT: Direct LDL: 68.5 mg/dL

## 2012-01-28 NOTE — Progress Notes (Signed)
Quick Note:  Pt informed ______ 

## 2012-02-29 ENCOUNTER — Ambulatory Visit (INDEPENDENT_AMBULATORY_CARE_PROVIDER_SITE_OTHER): Payer: Managed Care, Other (non HMO) | Admitting: Family Medicine

## 2012-02-29 ENCOUNTER — Encounter: Payer: Self-pay | Admitting: Family Medicine

## 2012-02-29 VITALS — BP 110/70 | Temp 98.5°F | Wt 191.0 lb

## 2012-02-29 DIAGNOSIS — F5104 Psychophysiologic insomnia: Secondary | ICD-10-CM

## 2012-02-29 DIAGNOSIS — I10 Essential (primary) hypertension: Secondary | ICD-10-CM

## 2012-02-29 DIAGNOSIS — G4700 Insomnia, unspecified: Secondary | ICD-10-CM

## 2012-02-29 MED ORDER — ZOLPIDEM TARTRATE 10 MG PO TABS: 10.0000 mg | ORAL_TABLET | Freq: Every evening | ORAL | Status: DC | PRN

## 2012-02-29 NOTE — Patient Instructions (Signed)

## 2012-02-29 NOTE — Progress Notes (Signed)
  Subjective:    Patient ID: Adam Villanueva, male    DOB: Jan 20, 1951, 61 y.o.   MRN: [id number]  HPI  Patient here to discuss insomnia issues. Chronic for several years. Refer to prior note. We're trying to taper him off amitriptyline. Started trazodone he has had no relief with this. He has not tolerated thus far tapering amitriptyline to 25 mg. Mostly difficulty falling asleep. He has tapered his metoclopramide back to once daily in hopes to taper off soon. His blood pressures been very well controlled. No orthostasis. Medications as outlined. He would like to consider tapering these further as well. Weight stable. No chest pains. No dizziness.  Past Medical History  Diagnosis Date  . Arthritis   . Allergy   . Hypertension   . Hyperlipidemia   . Prostatitis    No past surgical history on file.  reports that he has never smoked. He does not have any smokeless tobacco history on file. His alcohol and drug histories not on file. family history includes Arthritis in his father, maternal grandfather, maternal grandmother, mother, paternal grandfather, and paternal grandmother; Hyperlipidemia in his mother; and Hypertension in his father and mother. No Known Allergies    Review of Systems  Constitutional: Negative for appetite change and unexpected weight change.  Respiratory: Negative for cough and shortness of breath.   Cardiovascular: Negative for chest pain.  Gastrointestinal: Negative for nausea, vomiting and abdominal pain.  Neurological: Negative for dizziness.  Psychiatric/Behavioral: Negative for behavioral problems, confusion and agitation. The patient is not nervous/anxious.        Objective:   Physical Exam  Constitutional: He is oriented to person, place, and time. He appears well-developed and well-nourished.  Neck: Neck supple. No thyromegaly present.  Cardiovascular: Normal rate and regular rhythm.   Pulmonary/Chest: Effort normal and breath sounds normal. No  respiratory distress. He has no wheezes. He has no rales.  Musculoskeletal: He exhibits no edema.  Neurological: He is alert and oriented to person, place, and time. No cranial nerve deficit.  Psychiatric: He has a normal mood and affect. His behavior is normal.          Assessment & Plan:  #1 chronic insomnia. Discontinue trazodone. Trial Ambien 10 mg 1 each bedtime. Handout on sleep hygiene given. He'll try further tapering of amitriptyline again. #2 hypertension. Stable. Try reducing HCTZ and lisinopril one half tablet daily and reassess 3 months

## 2012-05-02 ENCOUNTER — Ambulatory Visit (INDEPENDENT_AMBULATORY_CARE_PROVIDER_SITE_OTHER): Payer: Managed Care, Other (non HMO) | Admitting: Family Medicine

## 2012-05-02 ENCOUNTER — Encounter: Payer: Self-pay | Admitting: Family Medicine

## 2012-05-02 VITALS — BP 130/78 | Temp 97.7°F | Wt 190.0 lb

## 2012-05-02 DIAGNOSIS — M791 Myalgia, unspecified site: Secondary | ICD-10-CM

## 2012-05-02 DIAGNOSIS — IMO0001 Reserved for inherently not codable concepts without codable children: Secondary | ICD-10-CM

## 2012-05-02 LAB — CK: Total CK: 148 U/L (ref 7–232)

## 2012-05-02 LAB — CBC WITH DIFFERENTIAL/PLATELET
Basophils Absolute: 0 10*3/uL (ref 0.0–0.1)
HCT: 50.9 % (ref 39.0–52.0)
Lymphocytes Relative: 21.8 % (ref 12.0–46.0)
Lymphs Abs: 1.1 10*3/uL (ref 0.7–4.0)
Monocytes Relative: 11.5 % (ref 3.0–12.0)
Neutrophils Relative %: 61.5 % (ref 43.0–77.0)
Platelets: 199 10*3/uL (ref 150.0–400.0)
RDW: 13.1 % (ref 11.5–14.6)

## 2012-05-02 LAB — SEDIMENTATION RATE: Sed Rate: 7 mm/hr (ref 0–22)

## 2012-05-02 MED ORDER — AMITRIPTYLINE HCL 50 MG PO TABS: 50.0000 mg | ORAL_TABLET | Freq: Every day | ORAL | Status: DC

## 2012-05-02 NOTE — Progress Notes (Signed)
  Subjective:    Patient ID: Adam Villanueva, male    DOB: 1950/11/22, 61 y.o.   MRN: [id number]  HPI  Patient seen with myalgias for the past 3 weeks. Mostly calves and forearms to lesser extent thighs. Has taken simvastatin for several years. Recent TSH was normal back in April. Denies any recent fever, skin rash, muscle cramp, tick bite, URI symptoms, cough, or any arthralgias.  Symptoms are moderate severity. He has occasional mild bifrontal headaches. Is taking Aleve which helped. No clear exacerbating features.  Other medications are as listed. Compliant with all medications. No new meds.  Past Medical History  Diagnosis Date  . Arthritis   . Allergy   . Hypertension   . Hyperlipidemia   . Prostatitis   . Chronic insomnia    No past surgical history on file.  reports that he has never smoked. He does not have any smokeless tobacco history on file. His alcohol and drug histories not on file. family history includes Arthritis in his father, maternal grandfather, maternal grandmother, mother, paternal grandfather, and paternal grandmother; Hyperlipidemia in his mother; and Hypertension in his father and mother. No Known Allergies    Review of Systems  Constitutional: Positive for fatigue. Negative for fever, chills, appetite change and unexpected weight change.  Respiratory: Negative for shortness of breath.   Cardiovascular: Negative for chest pain, palpitations and leg swelling.  Musculoskeletal: Positive for myalgias. Negative for back pain and arthralgias.  Skin: Negative for rash.  Neurological: Negative for dizziness.  Hematological: Negative for adenopathy. Does not bruise/bleed easily.       Objective:   Physical Exam  Constitutional: He is oriented to person, place, and time. He appears well-developed and well-nourished. No distress.  HENT:  Mouth/Throat: Oropharynx is clear and moist.  Neck: Neck supple. No thyromegaly present.  Cardiovascular: Normal rate and  regular rhythm.   Pulmonary/Chest: Effort normal and breath sounds normal. No respiratory distress. He has no wheezes. He has no rales.  Musculoskeletal: He exhibits no edema.  Lymphadenopathy:    He has no cervical adenopathy.  Neurological: He is alert and oriented to person, place, and time. He has normal reflexes. No cranial nerve deficit.       No strength deficits lower or upper extremities.  No sensory deficits.          Assessment & Plan:  Myalgias. Question statin-related. Discontinue simvastatin for now. Check labs with creatinine kinase total, sed rate, and CBC. Recent TSH normal. Touch base in 3 weeks if myalgias not resolving off simvastatin

## 2012-05-02 NOTE — Patient Instructions (Signed)
Hold Simvastatin for now and let me know if muscle pain is resolved in 2-3 weeks.

## 2012-05-03 NOTE — Progress Notes (Signed)
Quick Note:  Pt informed. He wanted it noted that the urologist gave him meloxicam for pain, not currently taking it. ______

## 2012-05-31 ENCOUNTER — Ambulatory Visit: Payer: Managed Care, Other (non HMO) | Admitting: Family Medicine

## 2012-05-31 ENCOUNTER — Telehealth: Payer: Self-pay | Admitting: Family Medicine

## 2012-05-31 NOTE — Telephone Encounter (Signed)
Patient called to report that his muscle pain has subsided and would like to know if he is to restart the simvastatin. Please advise.

## 2012-06-01 NOTE — Telephone Encounter (Signed)
Try once more and if muscle pain returns d/c and let us know.

## 2012-06-01 NOTE — Telephone Encounter (Signed)
Pt informed and in agreement

## 2012-07-29 ENCOUNTER — Telehealth: Payer: Self-pay | Admitting: Family Medicine

## 2012-07-29 NOTE — Telephone Encounter (Signed)
Caller: [name]/Patient; Patient Name: Adam Villanueva; PCP: Evelena Peat [facility]); Best Callback Phone Number: [phone]; Reason for call: Chest Pain/Chest Discomfort. Wants an appointment. Has a hiatal hernia that when flared up causes chest pain. Takes Protonix. States that he needs a 30 day regimen of Nexium. Onset chest pain approximately "a few weeks". States that he has been working long hours and has had increased stress. All emergent symptoms per Chest Pain protocol ruled out. Home care advice given. advised appointment within 3 days per guidelines. Appointment scheduled for 08/01/12 at 1530 with Dr. Caryl Never, per patient's request.  THE PATIENT REFUSED 911

## 2012-08-01 ENCOUNTER — Encounter: Payer: Self-pay | Admitting: Family Medicine

## 2012-08-01 ENCOUNTER — Ambulatory Visit (INDEPENDENT_AMBULATORY_CARE_PROVIDER_SITE_OTHER): Payer: Managed Care, Other (non HMO) | Admitting: Family Medicine

## 2012-08-01 VITALS — BP 122/84 | Temp 97.9°F | Wt 192.0 lb

## 2012-08-01 DIAGNOSIS — K219 Gastro-esophageal reflux disease without esophagitis: Secondary | ICD-10-CM

## 2012-08-01 NOTE — Patient Instructions (Addendum)
Try reduce elavil to 25 mg at night Supplement with Pepcid or Zantac for acid relief and call in 2 weeks if no better.

## 2012-08-01 NOTE — Progress Notes (Signed)
  Subjective:    Patient ID: Adam Villanueva, male    DOB: 1951-09-14, 61 y.o.   MRN: [id number]  HPI  Patient seen with 2 month history of some intermittent epigastric discomfort. He has a history of known hiatal hernia and had EGD back in May of 2008. He takes Protonix for the most part every day. Also takes Reglan once daily. Pain aggravated by things like OJ. Minimal caffeine use. No appetite or weight changes. Occasional dysphagia with foods but not consistently. He takes occasional supplement with antacids which have helped. No exertional chest pain. No dyspnea. No regular alcohol use. Recently placed by urologist on meloxicam which he takes somewhat infrequently.   Review of Systems  Constitutional: Negative for appetite change and unexpected weight change.  Respiratory: Negative for cough and shortness of breath.   Cardiovascular: Negative for chest pain.  Gastrointestinal: Positive for abdominal pain. Negative for nausea, vomiting, diarrhea and blood in stool.       Objective:   Physical Exam  Constitutional: He appears well-developed and well-nourished.  Cardiovascular: Normal rate and regular rhythm.   Pulmonary/Chest: Effort normal and breath sounds normal. No respiratory distress. He has no wheezes. He has no rales.  Abdominal: Soft. He exhibits no distension. There is no rebound.       Mild epigastric tenderness. His tenderness is mostly over the xiphoid process of the sternum          Assessment & Plan:  GERD/hiatal hernia. Rare inconsistent episodes of dysphagia. Add Pepcid or Zantac and continue protonix regularly. Consider referral back to GI if symptoms persist. He denies any worrisome symptoms such as weight loss or appetite change. Avoid nonsteroidals such as meloxicam.

## 2012-08-30 ENCOUNTER — Encounter: Payer: Self-pay | Admitting: Family Medicine

## 2012-08-30 ENCOUNTER — Ambulatory Visit (INDEPENDENT_AMBULATORY_CARE_PROVIDER_SITE_OTHER): Payer: Managed Care, Other (non HMO) | Admitting: Family Medicine

## 2012-08-30 VITALS — BP 108/70 | HR 81 | Temp 97.5°F | Wt 190.0 lb

## 2012-08-30 DIAGNOSIS — L309 Dermatitis, unspecified: Secondary | ICD-10-CM

## 2012-08-30 DIAGNOSIS — K529 Noninfective gastroenteritis and colitis, unspecified: Secondary | ICD-10-CM

## 2012-08-30 DIAGNOSIS — K5289 Other specified noninfective gastroenteritis and colitis: Secondary | ICD-10-CM

## 2012-08-30 DIAGNOSIS — L259 Unspecified contact dermatitis, unspecified cause: Secondary | ICD-10-CM

## 2012-08-30 MED ORDER — TRIAMCINOLONE ACETONIDE 0.1 % EX CREA: TOPICAL_CREAM | Freq: Two times a day (BID) | CUTANEOUS | Status: DC

## 2012-08-30 NOTE — Progress Notes (Signed)
Chief Complaint  Patient presents with  . Diarrhea    x 11 days; has been using otc meds- not working     HPI:  Diarrhea: -started about 10 days ago -symptoms: loose stools, started out watery and now more like soft serve ice cream, still having several bowel movements per day, a little nausea, has a hiatal hernia and has had some dysphagia with this at times and used to be follow by GI - but this is better now, a little cramping initially but now better -denies: fevers, chills, malaise, vomiting, blood in stools -is drinking a lot of water -had colonoscopy and EGD five years ago -has tried pepto-bismol -no recent travel or antibiotics  -no known sick contacts  Hand dryness: -notices more in winter -hands crack sometimes  ROS: See pertinent positives and negatives per HPI.  Past Medical History  Diagnosis Date  . Arthritis   . Allergy   . Hypertension   . Hyperlipidemia   . Prostatitis   . Chronic insomnia     Family History  Problem Relation Age of Onset  . Arthritis Mother   . Hyperlipidemia Mother   . Hypertension Mother   . Arthritis Father   . Hypertension Father   . Arthritis Maternal Grandmother   . Arthritis Maternal Grandfather   . Arthritis Paternal Grandmother   . Arthritis Paternal Grandfather     History   Social History  . Marital Status: Divorced    Spouse Name: N/A    Number of Children: N/A  . Years of Education: N/A   Social History Main Topics  . Smoking status: Never Smoker   . Smokeless tobacco: None  . Alcohol Use: None  . Drug Use: None  . Sexually Active: None   Other Topics Concern  . None   Social History Narrative  . None    Current outpatient prescriptions:amitriptyline (ELAVIL) 50 MG tablet, Take 1 tablet (50 mg total) by mouth at bedtime., Disp: 90 tablet, Rfl: 3;  atenolol (TENORMIN) 50 MG tablet, Take 1 tablet (50 mg total) by mouth daily., Disp: 90 tablet, Rfl: 3;  hydrochlorothiazide (HYDRODIURIL) 25 MG tablet,  Take 1 tablet (25 mg total) by mouth daily., Disp: 90 tablet, Rfl: 3 lisinopril (PRINIVIL,ZESTRIL) 40 MG tablet, Take 1 tablet (40 mg total) by mouth daily., Disp: 90 tablet, Rfl: 3;  metoCLOPramide (REGLAN) 10 MG tablet, Take 10 mg by mouth daily. , Disp: , Rfl: ;  pantoprazole (PROTONIX) 40 MG tablet, Take 1 tablet (40 mg total) by mouth daily., Disp: 90 tablet, Rfl: 3;  sildenafil (VIAGRA) 100 MG tablet, Take 100 mg by mouth daily as needed., Disp: , Rfl:  simvastatin (ZOCOR) 20 MG tablet, Take 1 tablet (20 mg total) by mouth every evening., Disp: 90 tablet, Rfl: 3;  meloxicam (MOBIC) 15 MG tablet, Take 15 mg by mouth as needed. Per Dr Ebbie Latus, urologist, Disp: , Rfl: ;  triamcinolone cream (KENALOG) 0.1 %, Apply topically 2 (two) times daily. For 1-2 weeks to affected area, Disp: 30 g, Rfl: 0  EXAM:  Filed Vitals:   08/30/12 1113  BP: 108/70  Pulse: 81  Temp: 97.5 F (36.4 C)    There is no height on file to calculate BMI.  GENERAL: vitals reviewed and listed above, alert, oriented, appears well hydrated and in no acute distress  HEENT: atraumatic, conjunttiva clear, no obvious abnormalities on inspection of external nose and ears  NECK: no obvious masses on inspection  LUNGS: clear to auscultation bilaterally, no  wheezes, rales or rhonchi, good air movement  CV: HRRR, no peripheral edema  ABD: BS+, soft, NTTP, no rebound or guarding  MS: moves all extremities without noticeable abnormality  SKIN: dry skin on hands with some cracking of skin  PSYCH: pleasant and cooperative, no obvious depression or anxiety  ASSESSMENT AND PLAN:  Discussed the following assessment and plan:  1. Gastroenteritis  -symptoms improving and benign exam -suspect viral enteritis -supportive tx  -return precautions -no dairy  2. Hand eczema  triamcinolone cream (KENALOG) 0.1 % -emollient    -Patient advised to return or notify a doctor immediately if symptoms worsen or persist or new  concerns arise.  Patient Instructions  DIARRHEA: -drink plenty of fluids (gatorade watered down and broth are good) -eat whatever you want except avoid dairy for next week  -return if worsening (fevers, abdominal pain, other concerns) or not better in 1 week -can use loperamide as instructed  HAND: -thick moisturizer twice daily -steroid cream per instructions  Follow up with your doctor in 1 month or sooner if diarrhea persists or other concerns       KIM, HANNAH R.

## 2012-08-30 NOTE — Patient Instructions (Addendum)
DIARRHEA: -drink plenty of fluids (gatorade watered down and broth are good) -eat whatever you want except avoid dairy for next week  -return if worsening (fevers, abdominal pain, other concerns) or not better in 1 week -can use loperamide as instructed  HAND: -thick moisturizer twice daily -steroid cream per instructions  Follow up with your doctor in 1 month or sooner if diarrhea persists or other concerns

## 2012-11-30 ENCOUNTER — Ambulatory Visit (INDEPENDENT_AMBULATORY_CARE_PROVIDER_SITE_OTHER): Payer: Managed Care, Other (non HMO) | Admitting: Family Medicine

## 2012-11-30 ENCOUNTER — Encounter: Payer: Self-pay | Admitting: Family Medicine

## 2012-11-30 VITALS — BP 120/80 | Temp 98.0°F | Wt 183.0 lb

## 2012-11-30 DIAGNOSIS — K529 Noninfective gastroenteritis and colitis, unspecified: Secondary | ICD-10-CM

## 2012-11-30 DIAGNOSIS — R197 Diarrhea, unspecified: Secondary | ICD-10-CM

## 2012-11-30 DIAGNOSIS — R634 Abnormal weight loss: Secondary | ICD-10-CM

## 2012-11-30 NOTE — Patient Instructions (Addendum)
Diarrhea Infections caused by germs (bacterial) or a virus commonly cause diarrhea. Your caregiver has determined that with time, rest and fluids, the diarrhea should improve. In general, eat normally while drinking more water than usual. Although water may prevent dehydration, it does not contain salt and minerals (electrolytes). Broths, weak tea without caffeine and oral rehydration solutions (ORS) replace fluids and electrolytes. Small amounts of fluids should be taken frequently. Large amounts at one time may not be tolerated. Plain water may be harmful in infants and the elderly. Oral rehydrating solutions (ORS) are available at pharmacies and grocery stores. ORS replace water and important electrolytes in proper proportions. Sports drinks are not as effective as ORS and may be harmful due to sugars worsening diarrhea.  ORS is especially recommended for use in children with diarrhea. As a general guideline for children, replace any new fluid losses from diarrhea and/or vomiting with ORS as follows:  If your child weighs 22 pounds or under (10 kg or less), give 60-120 mL ( -  cup or 2 - 4 ounces) of ORS for each episode of diarrheal stool or vomiting episode.  If your child weighs more than 22 pounds (more than 10 kgs), give 120-240 mL ( - 1 cup or 4 - 8 ounces) of ORS for each diarrheal stool or episode of vomiting.  While correcting for dehydration, children should eat normally. However, foods high in sugar should be avoided because this may worsen diarrhea. Large amounts of carbonated soft drinks, juice, gelatin desserts and other highly sugared drinks should be avoided.  After correction of dehydration, other liquids that are appealing to the child may be added. Children should drink small amounts of fluids frequently and fluids should be increased as tolerated. Children should drink enough fluids to keep urine clear or pale yellow.  Adults should eat normally while drinking more fluids than  usual. Drink small amounts of fluids frequently and increase as tolerated. Drink enough fluids to keep urine clear or pale yellow. Broths, weak decaffeinated tea, lemon lime soft drinks (allowed to go flat) and ORS replace fluids and electrolytes.  Avoid:  Carbonated drinks.  Juice.  Extremely hot or cold fluids.  Caffeine drinks.  Fatty, greasy foods.  Alcohol.  Tobacco.  Too much intake of anything at one time.  Gelatin desserts.  Probiotics are active cultures of beneficial bacteria. They may lessen the amount and number of diarrheal stools in adults. Probiotics can be found in yogurt with active cultures and in supplements.  Wash hands well to avoid spreading bacteria and virus.  Anti-diarrheal medications are not recommended for infants and children.  Only take over-the-counter or prescription medicines for pain, discomfort or fever as directed by your caregiver. Do not give aspirin to children because it may cause Reye's Syndrome.  For adults, ask your caregiver if you should continue all prescribed and over-the-counter medicines.  If your caregiver has given you a follow-up appointment, it is very important to keep that appointment. Not keeping the appointment could result in a chronic or permanent injury, and disability. If there is any problem keeping the appointment, you must call back to this facility for assistance. SEEK IMMEDIATE MEDICAL CARE IF:   You or your child is unable to keep fluids down or other symptoms or problems become worse in spite of treatment.  Vomiting or diarrhea develops and becomes persistent.  There is vomiting of blood or bile (green material).  There is blood in the stool or the stools are black and   tarry.  There is no urine output in 6-8 hours or there is only a small amount of very dark urine.  Abdominal pain develops, increases or localizes.  You have a fever.  Your baby is older than 3 months with a rectal temperature of 102 F  (38.9 C) or higher.  Your baby is 84 months old or younger with a rectal temperature of 100.4 F (38 C) or higher.  You or your child develops excessive weakness, dizziness, fainting or extreme thirst.  You or your child develops a rash, stiff neck, severe headache or become irritable or sleepy and difficult to awaken. MAKE SURE YOU:   Understand these instructions.  Will watch your condition.  Will get help right away if you are not doing well or get worse. Document Released: 09/04/2002 Document Revised: 12/07/2011 Document Reviewed: 07/22/2009 [facility] Patient Information 2013 [place], [place].  Try to reduce Reglan (metocloprmide) Be in touch if diarrhea persists by one week.

## 2012-11-30 NOTE — Progress Notes (Signed)
  Subjective:    Patient ID: Adam Villanueva, male    DOB: 1951/09/14, 62 y.o.   MRN: [id number]  HPI Acute visit.  Recurrent diarrhea. No reviewed from December 2013. Patient presented then with about two-week history of diarrhea. Felt to be viral. Resolved after about 2 weeks.  Current episode started about 2 weeks ago. 2-4 watery to loose stools per day. No bloody stools. Occasional diffuse abdominal cramps. Denies any associated fever, bloody stools, recent travels, or antibiotic use. Increased stress of dealing with mother's illness. Mother just diagnosed with rectal cancer. Patient had screening colonoscopy about 6 years ago. Does have 7 pound weight loss since last visit.  Imodium helps.  Possibly worse with dairy consumption. No history of lactose intolerance. Takes chronic Reglan intermittently for dysphagia and was placed on the spot primary physician prior to transferring here  Past Medical History  Diagnosis Date  . Arthritis   . Allergy   . Hypertension   . Hyperlipidemia   . Prostatitis   . Chronic insomnia    No past surgical history on file.  reports that he has never smoked. He does not have any smokeless tobacco history on file. His alcohol and drug histories are not on file. family history includes Arthritis in his father, maternal grandfather, maternal grandmother, mother, paternal grandfather, and paternal grandmother; Hyperlipidemia in his mother; and Hypertension in his father and mother. No Known Allergies    Review of Systems  Constitutional: Positive for fatigue and unexpected weight change. Negative for fever and chills.  HENT: Negative for trouble swallowing.   Respiratory: Negative for cough and shortness of breath.   Cardiovascular: Negative for chest pain.  Gastrointestinal: Positive for diarrhea. Negative for nausea, vomiting, abdominal pain, constipation and blood in stool.  Endocrine: Negative for polydipsia, polyphagia and polyuria.  Skin:  Negative for rash.  Hematological: Negative for adenopathy.       Objective:   Physical Exam  Constitutional: He appears well-developed and well-nourished. No distress.  HENT:  Mouth/Throat: Oropharynx is clear and moist.  Neck: Neck supple. No thyromegaly present.  Cardiovascular: Normal rate and regular rhythm.   Pulmonary/Chest: Effort normal and breath sounds normal. No respiratory distress. He has no wheezes. He has no rales.  Abdominal: Soft. Bowel sounds are normal. He exhibits no distension and no mass. There is no tenderness. There is no rebound and no guarding.  Genitourinary:  Prostate slightly enlarged. Normal anal sphincter tone. No rectal mass  Musculoskeletal: He exhibits no edema.  Lymphadenopathy:    He has no cervical adenopathy.  Skin: No rash noted.          Assessment & Plan:  Recurrent diarrhea. Mild weight loss which may or may not be associated. Increased situational stressors which could be related. No evidence for major depression. Given duration of symptoms and weight loss check some screening labs and go ahead with stool culture and C. difficile assay though no specific risk factors of antibiotic use. Scale back Reglan use, if possible.  Consider GI referral if symptoms persist into next week

## 2012-12-01 LAB — BASIC METABOLIC PANEL
Calcium: 8.9 mg/dL (ref 8.4–10.5)
GFR: 65.71 mL/min (ref >60.00)
Sodium: 136 mEq/L (ref 135–145)

## 2012-12-01 LAB — CBC WITH DIFFERENTIAL/PLATELET
Basophils Absolute: 0 10*3/uL (ref 0.0–0.1)
HCT: 48.5 % (ref 39.0–52.0)
Lymphs Abs: 1.5 10*3/uL (ref 0.7–4.0)
MCV: 91.3 fl (ref 78.0–100.0)
Monocytes Absolute: 0.6 10*3/uL (ref 0.1–1.0)
Platelets: 239 10*3/uL (ref 150.0–400.0)
RDW: 13.4 % (ref 11.5–14.6)

## 2012-12-05 NOTE — Progress Notes (Signed)
Quick Note:  Fast busy signal again. Will call again when stool studies result comes back ______

## 2012-12-07 LAB — CLOSTRIDIUM DIFFICILE BY PCR: Toxigenic C. Difficile by PCR: NOT DETECTED

## 2012-12-08 NOTE — Progress Notes (Signed)
Quick Note:  Pt informed ______ 

## 2012-12-10 LAB — STOOL CULTURE

## 2012-12-12 NOTE — Progress Notes (Signed)
Quick Note:  Pt informed and reports some improvement. He would like to give it a little more time. He will let us know if things do not continue to improve. ______

## 2013-02-14 ENCOUNTER — Other Ambulatory Visit (INDEPENDENT_AMBULATORY_CARE_PROVIDER_SITE_OTHER): Payer: Managed Care, Other (non HMO)

## 2013-02-14 DIAGNOSIS — Z0000 Encounter for general adult medical examination without abnormal findings: Secondary | ICD-10-CM

## 2013-02-14 LAB — BASIC METABOLIC PANEL
BUN: 12 mg/dL (ref 6–23)
Chloride: 95 mEq/L — ABNORMAL LOW (ref 96–112)
Creatinine, Ser: 1 mg/dL (ref 0.4–1.5)

## 2013-02-14 LAB — CBC WITH DIFFERENTIAL/PLATELET
Basophils Absolute: 0 10*3/uL (ref 0.0–0.1)
Eosinophils Absolute: 0.2 10*3/uL (ref 0.0–0.7)
Eosinophils Relative: 4.3 % (ref 0.0–5.0)
MCV: 92.8 fl (ref 78.0–100.0)
Monocytes Absolute: 0.4 10*3/uL (ref 0.1–1.0)
Neutrophils Relative %: 55.8 % (ref 43.0–77.0)
Platelets: 192 10*3/uL (ref 150.0–400.0)
WBC: 4 10*3/uL — ABNORMAL LOW (ref 4.5–10.5)

## 2013-02-14 LAB — LIPID PANEL
Total CHOL/HDL Ratio: 5
VLDL: 50 mg/dL — ABNORMAL HIGH (ref 0.0–40.0)

## 2013-02-14 LAB — POCT URINALYSIS DIPSTICK
Blood, UA: NEGATIVE
Ketones, UA: NEGATIVE
Leukocytes, UA: NEGATIVE
Protein, UA: NEGATIVE
pH, UA: 7

## 2013-02-14 LAB — HEPATIC FUNCTION PANEL
ALT: 26 U/L (ref 0–53)
Bilirubin, Direct: 0.1 mg/dL (ref 0.0–0.3)
Total Bilirubin: 0.9 mg/dL (ref 0.3–1.2)

## 2013-02-14 LAB — LDL CHOLESTEROL, DIRECT: Direct LDL: 70.6 mg/dL

## 2013-02-21 ENCOUNTER — Encounter: Payer: Managed Care, Other (non HMO) | Admitting: Family Medicine

## 2013-02-23 ENCOUNTER — Encounter: Payer: Self-pay | Admitting: Family Medicine

## 2013-02-23 ENCOUNTER — Ambulatory Visit (INDEPENDENT_AMBULATORY_CARE_PROVIDER_SITE_OTHER): Payer: Managed Care, Other (non HMO) | Admitting: Family Medicine

## 2013-02-23 VITALS — BP 110/60 | HR 72 | Temp 98.7°F | Resp 12 | Ht 70.0 in | Wt 186.0 lb

## 2013-02-23 DIAGNOSIS — K219 Gastro-esophageal reflux disease without esophagitis: Secondary | ICD-10-CM

## 2013-02-23 DIAGNOSIS — Z23 Encounter for immunization: Secondary | ICD-10-CM

## 2013-02-23 DIAGNOSIS — E871 Hypo-osmolality and hyponatremia: Secondary | ICD-10-CM

## 2013-02-23 DIAGNOSIS — Z0000 Encounter for general adult medical examination without abnormal findings: Secondary | ICD-10-CM

## 2013-02-23 DIAGNOSIS — E785 Hyperlipidemia, unspecified: Secondary | ICD-10-CM

## 2013-02-23 DIAGNOSIS — I10 Essential (primary) hypertension: Secondary | ICD-10-CM

## 2013-02-23 MED ORDER — PANTOPRAZOLE SODIUM 40 MG PO TBEC: 40.0000 mg | DELAYED_RELEASE_TABLET | Freq: Every day | ORAL | Status: DC

## 2013-02-23 MED ORDER — SILDENAFIL CITRATE 100 MG PO TABS: 100.0000 mg | ORAL_TABLET | Freq: Every day | ORAL | Status: DC | PRN

## 2013-02-23 MED ORDER — LISINOPRIL 40 MG PO TABS: 40.0000 mg | ORAL_TABLET | Freq: Every day | ORAL | Status: DC

## 2013-02-23 MED ORDER — AMITRIPTYLINE HCL 50 MG PO TABS: 50.0000 mg | ORAL_TABLET | Freq: Every day | ORAL | Status: DC

## 2013-02-23 MED ORDER — SIMVASTATIN 20 MG PO TABS: 20.0000 mg | ORAL_TABLET | Freq: Every evening | ORAL | Status: DC

## 2013-02-23 MED ORDER — ATENOLOL 50 MG PO TABS: 50.0000 mg | ORAL_TABLET | Freq: Every day | ORAL | Status: DC

## 2013-02-23 NOTE — Progress Notes (Signed)
Subjective:    Patient ID: Adam Villanueva, male    DOB: Jul 07, 1951, 62 y.o.   MRN: [id number]  HPI Patient here for complete physical Has chronic problems include history of hypertension, hyperlipidemia, chronic insomnia, GERD Medications reviewed he needs refills of several today.  Blood pressure running somewhat on the low side. No consistent symptoms of orthostasis. History of mild hyponatremia on HCTZ  GERD symptoms generally well-controlled with protonix. Occasionally supplements with Reglan. No recent dysphagia.  Last tetanus unknown. Colonoscopy 2008. Mother recently diagnosed with rectal cancer age 34. Patient is nonsmoker. No consistent exercise.  Past Medical History  Diagnosis Date  . Arthritis   . Allergy   . Hypertension   . Hyperlipidemia   . Prostatitis   . Chronic insomnia    No past surgical history on file.  reports that he has never smoked. He does not have any smokeless tobacco history on file. His alcohol and drug histories are not on file. family history includes Arthritis in his father, maternal grandfather, maternal grandmother, mother, paternal grandfather, and paternal grandmother; Cancer in his mother; Hyperlipidemia in his mother; and Hypertension in his father and mother. No Known Allergies    Review of Systems  Constitutional: Negative for fever, activity change, appetite change, fatigue and unexpected weight change.  HENT: Negative for ear pain, congestion, sore throat, trouble swallowing and voice change.   Eyes: Negative for pain and visual disturbance.  Respiratory: Negative for cough, shortness of breath and wheezing.   Cardiovascular: Negative for chest pain and palpitations.  Gastrointestinal: Negative for nausea, vomiting, abdominal pain, diarrhea, constipation, blood in stool, abdominal distention and rectal pain.  Endocrine: Negative for polydipsia and polyuria.  Genitourinary: Negative for dysuria, hematuria and testicular pain.   Musculoskeletal: Negative for joint swelling and arthralgias.  Skin: Negative for rash.  Neurological: Negative for dizziness, syncope and headaches.  Hematological: Negative for adenopathy.  Psychiatric/Behavioral: Negative for confusion and dysphoric mood.       Objective:   Physical Exam  Constitutional: He is oriented to person, place, and time. He appears well-developed and well-nourished. No distress.  HENT:  Head: Normocephalic and atraumatic.  Right Ear: External ear normal.  Left Ear: External ear normal.  Mouth/Throat: Oropharynx is clear and moist.  Eyes: Conjunctivae and EOM are normal. Pupils are equal, round, and reactive to light.  Neck: Normal range of motion. Neck supple. No thyromegaly present.  Cardiovascular: Normal rate, regular rhythm and normal heart sounds.   No murmur heard. Pulmonary/Chest: No respiratory distress. He has no wheezes. He has no rales.  Abdominal: Soft. Bowel sounds are normal. He exhibits no distension and no mass. There is no tenderness. There is no rebound and no guarding.  Genitourinary: Rectum normal.  Prostate minimally enlarged and nontender. No asymmetry. No nodules.  Musculoskeletal: He exhibits no edema.  Lymphadenopathy:    He has no cervical adenopathy.  Neurological: He is alert and oriented to person, place, and time. He displays normal reflexes. No cranial nerve deficit.  Skin: No rash noted.  Psychiatric: He has a normal mood and affect.          Assessment & Plan:  #1 complete physical. Labs reviewed with patient. Mild hyponatremia probably related HCTZ. Colonoscopy up to date. Tetanus booster given. #2 hypertension. Well controlled. Occasional recent low blood pressure. Discontinue HCTZ and monitor closely #3 mild hyponatremia. Discontinue HCTZ #4 dyslipidemia. Has low HDL and high triglyceride. Discussed lifestyle management. Continue to simvastatin with refills given #5 history of  GERD. Generally well-controlled  with pantoprazole. Refills for one year #6 erectile dysfunction. Stable. Refilled Viagra

## 2013-02-23 NOTE — Addendum Note (Signed)
Addended by: Melchor Amour on: 02/23/2013 09:42 AM   Modules accepted: Orders

## 2013-02-23 NOTE — Patient Instructions (Addendum)
Stop hydrochlorothiazide Monitor blood pressure closely and be in touch if consistently greater than 140/90 Try to establish consistent aerobic exercise

## 2013-04-03 ENCOUNTER — Encounter: Payer: Self-pay | Admitting: Family Medicine

## 2013-04-03 ENCOUNTER — Ambulatory Visit (INDEPENDENT_AMBULATORY_CARE_PROVIDER_SITE_OTHER): Payer: Managed Care, Other (non HMO) | Admitting: Family Medicine

## 2013-04-03 VITALS — BP 126/82 | HR 80 | Temp 99.1°F | Wt 189.0 lb

## 2013-04-03 DIAGNOSIS — M2550 Pain in unspecified joint: Secondary | ICD-10-CM

## 2013-04-03 LAB — SEDIMENTATION RATE: Sed Rate: 5 mm/hr (ref 0–22)

## 2013-04-03 MED ORDER — PREDNISONE 10 MG PO TABS: ORAL_TABLET | ORAL | Status: DC

## 2013-04-03 NOTE — Progress Notes (Signed)
  Subjective:    Patient ID: Adam Villanueva, male    DOB: October 22, 1950, 62 y.o.   MRN: [id number]  HPI Acute visit Patient seen with relatively acute polyarthralgias symmetrically involving hips elbows and hands. Onset about 10 days ago. He has had some fatigue which has been more chronic. No rashes. No fever. No recent tick bites. Positive family history of rheumatoid arthritis in his mother. Patient is taking Aleve which helps slightly. Symptoms are worse the morning and gradually gets somewhat better. Pain is moderate. He recalls having similar symptoms once in the past.  Past Medical History  Diagnosis Date  . Arthritis   . Allergy   . Hypertension   . Hyperlipidemia   . Prostatitis   . Chronic insomnia    No past surgical history on file.  reports that he has never smoked. He does not have any smokeless tobacco history on file. His alcohol and drug histories are not on file. family history includes Arthritis in his father, maternal grandfather, maternal grandmother, mother, paternal grandfather, and paternal grandmother; Cancer in his mother; Hyperlipidemia in his mother; and Hypertension in his father and mother. No Known Allergies    Review of Systems  Constitutional: Positive for fatigue. Negative for appetite change and unexpected weight change.  Respiratory: Negative for cough and shortness of breath.   Cardiovascular: Negative for chest pain.  Musculoskeletal: Positive for arthralgias. Negative for myalgias.  Skin: Negative for rash.  Neurological: Negative for dizziness and weakness.  Hematological: Negative for adenopathy. Does not bruise/bleed easily.       Objective:   Physical Exam  Constitutional: He appears well-developed and well-nourished.  Neck: Neck supple. No thyromegaly present.  Cardiovascular: Normal rate and regular rhythm.   Pulmonary/Chest: Effort normal and breath sounds normal. No respiratory distress. He has no wheezes. He has no rales.   Musculoskeletal: He exhibits no edema.  Elbows, wrists, hands, knees, ankles examined. No erythema, warmth, or any edema. Full range of motion all joints.  Lymphadenopathy:    He has no cervical adenopathy.  Skin: No rash noted.          Assessment & Plan:  Polyarthralgias. No objective evidence for inflammatory changes but given relatively acute onset involving multiple joints symmetrically will check ANA, rheumatoid factor, sedimentation rate, and CCP antibody

## 2013-04-04 LAB — ANA: Anti Nuclear Antibody(ANA): NEGATIVE

## 2013-05-10 ENCOUNTER — Encounter: Payer: Self-pay | Admitting: Family Medicine

## 2013-05-10 ENCOUNTER — Ambulatory Visit (INDEPENDENT_AMBULATORY_CARE_PROVIDER_SITE_OTHER): Payer: Managed Care, Other (non HMO) | Admitting: Family Medicine

## 2013-05-10 VITALS — BP 130/76 | HR 82 | Temp 98.1°F | Wt 190.0 lb

## 2013-05-10 DIAGNOSIS — R208 Other disturbances of skin sensation: Secondary | ICD-10-CM

## 2013-05-10 DIAGNOSIS — R209 Unspecified disturbances of skin sensation: Secondary | ICD-10-CM

## 2013-05-10 NOTE — Progress Notes (Signed)
  Subjective:    Patient ID: Adam Villanueva, male    DOB: Apr 06, 1951, 62 y.o.   MRN: [id number]  HPI  Acute visit Onset last week bilateral "burning" feet and hands. No arm or leg involvement. Lasted few minutes.  Moderate severity No alleviating or exacerbating factors. Some fatigue over recent months.    No associated weakness.  Episodes yesterday lasted longer. No history of diabetes. Recent TSH normal. Patient does take chronic protonix.  Past Medical History  Diagnosis Date  . Arthritis   . Allergy   . Hypertension   . Hyperlipidemia   . Prostatitis   . Chronic insomnia    No past surgical history on file.  reports that he has never smoked. He does not have any smokeless tobacco history on file. His alcohol and drug histories are not on file. family history includes Arthritis in his father, maternal grandfather, maternal grandmother, mother, paternal grandfather, and paternal grandmother; Cancer in his mother; Hyperlipidemia in his mother; Hypertension in his father and mother. No Known Allergies   Review of Systems  Constitutional: Negative for fever, chills, appetite change and unexpected weight change.  Respiratory: Negative for cough and shortness of breath.   Cardiovascular: Negative for chest pain, palpitations and leg swelling.  Gastrointestinal: Negative for nausea and vomiting.  Skin: Negative for rash.  Neurological: Negative for weakness, numbness and headaches.  Hematological: Negative for adenopathy.       Objective:   Physical Exam  Constitutional: He appears well-developed and well-nourished.  Neck: Neck supple. No thyromegaly present.  Cardiovascular: Normal rate and regular rhythm.   Pulmonary/Chest: Effort normal and breath sounds normal. No respiratory distress. He has no wheezes. He has no rales.  Musculoskeletal: He exhibits no edema.  Good distal pulses upper and lower extremities  Neurological:  Deep tender reflexes upper and lower  extremities symmetric and normal. No focal strength deficits. Normal sensory function.          Assessment & Plan:  Bilateral upper and lower extremity dysesthesias. Check B12 level. patient takes chronic proton pump inhibitor. He is already taking amitriptyline at night for insomnia. He has no history of diabetes and recent TSH normal. No other clear risk factors for neuropathy

## 2013-05-11 LAB — VITAMIN B12: Vitamin B-12: 651 pg/mL (ref 211–911)

## 2013-05-26 ENCOUNTER — Ambulatory Visit (INDEPENDENT_AMBULATORY_CARE_PROVIDER_SITE_OTHER): Payer: Managed Care, Other (non HMO) | Admitting: Family Medicine

## 2013-05-26 ENCOUNTER — Encounter: Payer: Self-pay | Admitting: Family Medicine

## 2013-05-26 VITALS — BP 134/80 | HR 72 | Temp 97.7°F | Wt 193.0 lb

## 2013-05-26 DIAGNOSIS — Z23 Encounter for immunization: Secondary | ICD-10-CM

## 2013-05-26 DIAGNOSIS — M159 Polyosteoarthritis, unspecified: Secondary | ICD-10-CM

## 2013-05-26 DIAGNOSIS — I10 Essential (primary) hypertension: Secondary | ICD-10-CM

## 2013-05-26 NOTE — Addendum Note (Signed)
Addended by: Thomasena Edis on: 05/26/2013 09:27 AM   Modules accepted: Orders

## 2013-05-26 NOTE — Progress Notes (Signed)
  Subjective:    Patient ID: Adam Villanueva, male    DOB: 07-16-1951, 62 y.o.   MRN: [id number]  HPI Patient here for followup regarding hypertension and polyarthralgias. Blood pressure well controlled on atenolol and lisinopril. No headaches or dizziness. Recent paresthesias have resolved. B12 level is normal.  He had several months of polyarthralgias. No signs of inflammatory changes. Symptoms have been relatively symmetric. We had gotten several screening labs for inflammatory arthritis and these were all normal He does not take much medication. No consistent exercise. Takes occasional Aleve which helps  Past Medical History  Diagnosis Date  . Arthritis   . Allergy   . Hypertension   . Hyperlipidemia   . Prostatitis   . Chronic insomnia    No past surgical history on file.  reports that he has never smoked. He does not have any smokeless tobacco history on file. His alcohol and drug histories are not on file. family history includes Arthritis in his father, maternal grandfather, maternal grandmother, mother, paternal grandfather, and paternal grandmother; Cancer in his mother; Hyperlipidemia in his mother; Hypertension in his father and mother. No Known Allergies    Review of Systems  Constitutional: Negative for fatigue and unexpected weight change.  Eyes: Negative for visual disturbance.  Respiratory: Negative for cough, chest tightness and shortness of breath.   Cardiovascular: Negative for chest pain, palpitations and leg swelling.  Musculoskeletal: Positive for arthralgias.  Neurological: Negative for dizziness, syncope, weakness, light-headedness and headaches.       Objective:   Physical Exam  Constitutional: He appears well-developed and well-nourished.  Neck: Neck supple. No thyromegaly present.  Cardiovascular: Normal rate and regular rhythm.   No murmur heard. Pulmonary/Chest: Effort normal and breath sounds normal. No respiratory distress. He has no wheezes.  He has no rales.  Musculoskeletal: He exhibits no edema.  Joints are examined. No inflammation changes. No erythema. No warmth. No edema          Assessment & Plan:  #1 osteoarthritis involving multiple joints. We discussed options such as Tylenol or possibly tramadol. Avoid regular use of nonsteroidals. Also discussed the importance of regular exercise to maintain mobility and strength #2 hypertension well controlled. Continue current medications  Patient requesting flu vaccine. No contraindications. This will be given.

## 2013-05-26 NOTE — Patient Instructions (Signed)
Osteoarthritis Osteoarthritis is the most common form of arthritis. It is redness, soreness, and swelling (inflammation) affecting the cartilage. Cartilage acts as a cushion, covering the ends of bones where they meet to form a joint. CAUSES  Over time, the cartilage begins to wear away. This causes bone to rub on bone. This produces pain and stiffness in the affected joints. Factors that contribute to this problem are:  Excessive body weight.  Age.  Overuse of joints. SYMPTOMS   People with osteoarthritis usually experience joint pain, swelling, or stiffness.  Over time, the joint may lose its normal shape.  Small deposits of bone (osteophytes) may grow on the edges of the joint.  Bits of bone or cartilage can break off and float inside the joint space. This may cause more pain and damage.  Osteoarthritis can lead to depression, anxiety, feelings of helplessness, and limitations on daily activities. The most commonly affected joints are in the:  Ends of the fingers.  Thumbs.  Neck.  Lower back.  Knees.  Hips. DIAGNOSIS  Diagnosis is mostly based on your symptoms and exam. Tests may be helpful, including:  X-rays of the affected joint.  A computerized magnetic scan (MRI).  Blood tests to rule out other types of arthritis.  Joint fluid tests. This involves using a needle to draw fluid from the joint and examining the fluid under a microscope. TREATMENT  Goals of treatment are to control pain, improve joint function, maintain a normal body weight, and maintain a healthy lifestyle. Treatment approaches may include:  A prescribed exercise program with rest and joint relief.  Weight control with nutritional education.  Pain relief techniques such as:  Properly applied heat and cold.  Electric pulses delivered to nerve endings under the skin (transcutaneous electrical nerve stimulation, TENS).  Massage.  Certain supplements. Ask your caregiver before using any  supplements, especially in combination with prescribed drugs.  Medicines to control pain, such as:  Acetaminophen.  Nonsteroidal anti-inflammatory drugs (NSAIDs), such as naproxen.  Narcotic or central-acting agents, such as tramadol. This drug carries a risk of addiction and is generally prescribed for short-term use.  Corticosteroids. These can be given orally or as injection. This is a short-term treatment, not recommended for routine use.  Surgery to reposition the bones and relieve pain (osteotomy) or to remove loose pieces of bone and cartilage. Joint replacement may be needed in advanced states of osteoarthritis. HOME CARE INSTRUCTIONS  Your caregiver can recommend specific types of exercise. These may include:  Strengthening exercises. These are done to strengthen the muscles that support joints affected by arthritis. They can be performed with weights or with exercise bands to add resistance.  Aerobic activities. These are exercises, such as brisk walking or low-impact aerobics, that get your heart pumping. They can help keep your lungs and circulatory system in shape.  Range-of-motion activities. These keep your joints limber.  Balance and agility exercises. These help you maintain daily living skills. Learning about your condition and being actively involved in your care will help improve the course of your osteoarthritis. SEEK MEDICAL CARE IF:   You feel hot or your skin turns red.  You develop a rash in addition to your joint pain.  You have an oral temperature above 102 F (38.9 C). FOR MORE INFORMATION  National Institute of Arthritis and Musculoskeletal and Skin Diseases: www.niams.nih.gov National Institute on Aging: www.nia.nih.gov American College of Rheumatology: www.rheumatology.org Document Released: 09/14/2005 Document Revised: 12/07/2011 Document Reviewed: 12/26/2009 [employer] Patient Information 2014 [employer].  

## 2013-11-15 ENCOUNTER — Encounter: Payer: Self-pay | Admitting: Family Medicine

## 2013-11-15 ENCOUNTER — Ambulatory Visit (INDEPENDENT_AMBULATORY_CARE_PROVIDER_SITE_OTHER): Payer: Managed Care, Other (non HMO) | Admitting: Family Medicine

## 2013-11-15 VITALS — BP 130/76 | HR 98 | Temp 97.8°F | Wt 193.0 lb

## 2013-11-15 DIAGNOSIS — B9789 Other viral agents as the cause of diseases classified elsewhere: Principal | ICD-10-CM

## 2013-11-15 DIAGNOSIS — J069 Acute upper respiratory infection, unspecified: Secondary | ICD-10-CM

## 2013-11-15 MED ORDER — HYDROCODONE-HOMATROPINE 5-1.5 MG/5ML PO SYRP: 5.0000 mL | ORAL_SOLUTION | Freq: Four times a day (QID) | ORAL | Status: AC | PRN

## 2013-11-15 NOTE — Progress Notes (Signed)
Pre visit review using our clinic review tool, if applicable. No additional management support is needed unless otherwise documented below in the visit note. 

## 2013-11-15 NOTE — Progress Notes (Signed)
   Subjective:    Patient ID: Adam Villanueva, male    DOB: 02-03-1951, 63 y.o.   MRN: [id number]  Cough Associated symptoms include headaches and a sore throat. Pertinent negatives include no chills or fever.   acute visit for cough, nasal congestion, fatigue, mild body aches, sore throat, and mild intermittent headaches. Onset of symptoms about 4 days ago. No documented fever. Cough is been especially bothersome at night. Not relieved with over-the-counter medications. Patient is nonsmoker. Denies any dyspnea. No wheezing.  Past Medical History  Diagnosis Date  . Arthritis   . Allergy   . Hypertension   . Hyperlipidemia   . Prostatitis   . Chronic insomnia    No past surgical history on file.  reports that he has never smoked. He does not have any smokeless tobacco history on file. His alcohol and drug histories are not on file. family history includes Arthritis in his father, maternal grandfather, maternal grandmother, mother, paternal grandfather, and paternal grandmother; Cancer in his mother; Hyperlipidemia in his mother; Hypertension in his father and mother. No Known Allergies     Review of Systems  Constitutional: Positive for fatigue. Negative for fever and chills.  HENT: Positive for congestion and sore throat.   Respiratory: Positive for cough.   Neurological: Positive for headaches.       Objective:   Physical Exam  Constitutional: He appears well-developed and well-nourished.  HENT:  Mouth/Throat: Oropharynx is clear and moist.  Neck: Neck supple.  Cardiovascular: Normal rate and regular rhythm.   Pulmonary/Chest: Effort normal and breath sounds normal. No respiratory distress. He has no wheezes. He has no rales.  Lymphadenopathy:    He has no cervical adenopathy.          Assessment & Plan:  Acute upper respiratory infection with cough. Suspect viral. Nonfocal exam. Hycodan cough syrup 1 teaspoon every 6 hours as needed for severe cough with caution for  possible sedation.

## 2013-11-15 NOTE — Patient Instructions (Signed)

## 2013-11-27 ENCOUNTER — Encounter: Payer: Self-pay | Admitting: Family Medicine

## 2013-11-27 ENCOUNTER — Ambulatory Visit (INDEPENDENT_AMBULATORY_CARE_PROVIDER_SITE_OTHER): Payer: Managed Care, Other (non HMO) | Admitting: Family Medicine

## 2013-11-27 VITALS — BP 126/78 | HR 83 | Wt 196.0 lb

## 2013-11-27 DIAGNOSIS — R05 Cough: Secondary | ICD-10-CM

## 2013-11-27 DIAGNOSIS — K219 Gastro-esophageal reflux disease without esophagitis: Secondary | ICD-10-CM

## 2013-11-27 DIAGNOSIS — R059 Cough, unspecified: Secondary | ICD-10-CM

## 2013-11-27 DIAGNOSIS — E785 Hyperlipidemia, unspecified: Secondary | ICD-10-CM

## 2013-11-27 DIAGNOSIS — R058 Other specified cough: Secondary | ICD-10-CM

## 2013-11-27 DIAGNOSIS — I10 Essential (primary) hypertension: Secondary | ICD-10-CM

## 2013-11-27 NOTE — Progress Notes (Signed)
   Subjective:    Patient ID: Adam Villanueva, male    DOB: 08-11-1951, 63 y.o.   MRN: [id number]  HPI Patient here for medical followup. Chronic prominence hypertension, hyperlipidemia, GERD, chronic insomnia. He is still battling with occasional cough from recent bronchial illness. Overall improved. Cough is nonproductive. Nonsmoker. No hemoptysis. No dyspnea.  Medications reviewed. Blood pressure stable. No dizziness. No headaches. No chest pains. Hyperlipidemia treated with simvastatin. No myalgias. No history of CAD. GERD symptoms with occasional flareups still with Protonix. Has tried several times discontinuing without success.  Past Medical History  Diagnosis Date  . Arthritis   . Allergy   . Hypertension   . Hyperlipidemia   . Prostatitis   . Chronic insomnia    No past surgical history on file.  reports that he has never smoked. He does not have any smokeless tobacco history on file. His alcohol and drug histories are not on file. family history includes Arthritis in his father, maternal grandfather, maternal grandmother, mother, paternal grandfather, and paternal grandmother; Cancer in his mother; Hyperlipidemia in his mother; Hypertension in his father and mother. No Known Allergies    Review of Systems  Constitutional: Negative for fatigue.  Eyes: Negative for visual disturbance.  Respiratory: Positive for cough. Negative for chest tightness, shortness of breath and wheezing.   Cardiovascular: Negative for chest pain, palpitations and leg swelling.  Endocrine: Negative for polydipsia and polyuria.  Neurological: Negative for dizziness, syncope, weakness, light-headedness and headaches.       Objective:   Physical Exam  Constitutional: He is oriented to person, place, and time. He appears well-developed and well-nourished.  HENT:  Right Ear: External ear normal.  Left Ear: External ear normal.  Mouth/Throat: Oropharynx is clear and moist.  Eyes: Pupils are equal,  round, and reactive to light.  Neck: Neck supple. No thyromegaly present.  Cardiovascular: Normal rate and regular rhythm.   Pulmonary/Chest: Effort normal and breath sounds normal. No respiratory distress. He has no wheezes. He has no rales.  Musculoskeletal: He exhibits no edema.  Neurological: He is alert and oriented to person, place, and time.          Assessment & Plan:  #1 hypertension. Well controlled. Continue current medications #2 hyperlipidemia. Patient will schedule complete physical 3-4 months and repeat lipids then #3 GERD which has generally been well controlled with PPI. Recent B12 level normal. Dietary factors discussed #4 dry cough. Lingering from likely recent viral bronchitis. Nonfocal exam. Symptoms are slowly improving and observe

## 2013-11-27 NOTE — Patient Instructions (Signed)
Check on coverage for shingles vaccine Consider complete physical sometime later this year.

## 2013-11-27 NOTE — Progress Notes (Signed)
Pre visit review using our clinic review tool, if applicable. No additional management support is needed unless otherwise documented below in the visit note. 

## 2013-11-28 ENCOUNTER — Telehealth: Payer: Self-pay | Admitting: Family Medicine

## 2013-11-28 NOTE — Telephone Encounter (Signed)
Relevant patient education assigned to patient using Emmi. ° °

## 2014-01-29 ENCOUNTER — Emergency Department (HOSPITAL_COMMUNITY): Payer: Managed Care, Other (non HMO)

## 2014-01-29 ENCOUNTER — Emergency Department (HOSPITAL_COMMUNITY)
Admission: EM | Admit: 2014-01-29 | Discharge: 2014-01-29 | Disposition: A | Discharge status: Home / Self Care | Payer: Managed Care, Other (non HMO) | Attending: Emergency Medicine | Admitting: Emergency Medicine

## 2014-01-29 ENCOUNTER — Encounter (HOSPITAL_COMMUNITY): Payer: Self-pay | Admitting: Emergency Medicine

## 2014-01-29 DIAGNOSIS — Z87448 Personal history of other diseases of urinary system: Secondary | ICD-10-CM | POA: Insufficient documentation

## 2014-01-29 DIAGNOSIS — E785 Hyperlipidemia, unspecified: Secondary | ICD-10-CM | POA: Insufficient documentation

## 2014-01-29 DIAGNOSIS — Z791 Long term (current) use of non-steroidal anti-inflammatories (NSAID): Secondary | ICD-10-CM | POA: Insufficient documentation

## 2014-01-29 DIAGNOSIS — I10 Essential (primary) hypertension: Secondary | ICD-10-CM | POA: Insufficient documentation

## 2014-01-29 DIAGNOSIS — Z8669 Personal history of other diseases of the nervous system and sense organs: Secondary | ICD-10-CM | POA: Insufficient documentation

## 2014-01-29 DIAGNOSIS — R300 Dysuria: Secondary | ICD-10-CM | POA: Insufficient documentation

## 2014-01-29 DIAGNOSIS — Z79899 Other long term (current) drug therapy: Secondary | ICD-10-CM | POA: Insufficient documentation

## 2014-01-29 DIAGNOSIS — R109 Unspecified abdominal pain: Secondary | ICD-10-CM | POA: Insufficient documentation

## 2014-01-29 DIAGNOSIS — M129 Arthropathy, unspecified: Secondary | ICD-10-CM | POA: Insufficient documentation

## 2014-01-29 LAB — CBC WITH DIFFERENTIAL/PLATELET
Basophils Absolute: 0 10*3/uL (ref 0.0–0.1)
Basophils Relative: 1 % (ref 0–1)
Eosinophils Absolute: 0.3 10*3/uL (ref 0.0–0.7)
Eosinophils Relative: 6 % — ABNORMAL HIGH (ref 0–5)
HCT: 44.7 % (ref 39.0–52.0)
Hemoglobin: 15.9 g/dL (ref 13.0–17.0)
LYMPHS ABS: 1.3 10*3/uL (ref 0.7–4.0)
LYMPHS PCT: 24 % (ref 12–46)
MCH: 31.2 pg (ref 26.0–34.0)
MCHC: 35.6 g/dL (ref 30.0–36.0)
MCV: 87.8 fL (ref 78.0–100.0)
Monocytes Absolute: 0.5 10*3/uL (ref 0.1–1.0)
Monocytes Relative: 10 % (ref 3–12)
NEUTROS PCT: 60 % (ref 43–77)
Neutro Abs: 3.1 10*3/uL (ref 1.7–7.7)
PLATELETS: 155 10*3/uL (ref 150–400)
RBC: 5.09 MIL/uL (ref 4.22–5.81)
RDW: 12.5 % (ref 11.5–15.5)
WBC: 5.1 10*3/uL (ref 4.0–10.5)

## 2014-01-29 LAB — COMPREHENSIVE METABOLIC PANEL
ALK PHOS: 63 U/L (ref 39–117)
ALT: 24 U/L (ref 0–53)
AST: 25 U/L (ref 0–37)
Albumin: 4.2 g/dL (ref 3.5–5.2)
BUN: 11 mg/dL (ref 6–23)
CO2: 28 mEq/L (ref 19–32)
Calcium: 9.4 mg/dL (ref 8.4–10.5)
Chloride: 100 mEq/L (ref 96–112)
Creatinine, Ser: 0.99 mg/dL (ref 0.50–1.35)
GFR calc non Af Amer: 87 mL/min — ABNORMAL LOW (ref >90)
GLUCOSE: 95 mg/dL (ref 70–99)
POTASSIUM: 4.1 meq/L (ref 3.7–5.3)
Sodium: 140 mEq/L (ref 137–147)
Total Bilirubin: 0.6 mg/dL (ref 0.3–1.2)
Total Protein: 7.2 g/dL (ref 6.0–8.3)

## 2014-01-29 LAB — URINALYSIS, ROUTINE W REFLEX MICROSCOPIC
Bilirubin Urine: NEGATIVE
Glucose, UA: NEGATIVE mg/dL
Hgb urine dipstick: NEGATIVE
Ketones, ur: NEGATIVE mg/dL
Leukocytes, UA: NEGATIVE
NITRITE: NEGATIVE
Protein, ur: NEGATIVE mg/dL
SPECIFIC GRAVITY, URINE: 1.005 (ref 1.005–1.030)
UROBILINOGEN UA: 0.2 mg/dL (ref 0.0–1.0)
pH: 7 (ref 5.0–8.0)

## 2014-01-29 MED ORDER — HYDROCODONE-ACETAMINOPHEN 5-325 MG PO TABS: 1.0000 | ORAL_TABLET | Freq: Four times a day (QID) | ORAL | Status: DC | PRN

## 2014-01-29 MED ORDER — KETOROLAC TROMETHAMINE 30 MG/ML IJ SOLN
30.0000 mg | Freq: Once | INTRAMUSCULAR | Status: AC
  Administered 2014-01-29: 30 mg via INTRAMUSCULAR
  Filled 2014-01-29: qty 1

## 2014-01-29 MED ORDER — SENNOSIDES-DOCUSATE SODIUM 8.6-50 MG PO TABS: 2.0000 | ORAL_TABLET | Freq: Every day | ORAL | Status: DC

## 2014-01-29 NOTE — Discharge Instructions (Signed)
As discussed, your evaluation today has been largely reassuring.  But, it is important that you monitor your condition carefully, and do not hesitate to return to the ED if you develop new, or concerning changes in your condition. ° °Otherwise, please follow-up with your physician for appropriate ongoing care. ° ° ° °Abdominal Pain, Adult °Many things can cause abdominal pain. Usually, abdominal pain is not caused by a disease and will improve without treatment. It can often be observed and treated at home. Your health care provider will do a physical exam and possibly order blood tests and X-rays to help determine the seriousness of your pain. However, in many cases, more time must pass before a clear cause of the pain can be found. Before that point, your health care provider may not know if you need more testing or further treatment. °HOME CARE INSTRUCTIONS  °Monitor your abdominal pain for any changes. The following actions may help to alleviate any discomfort you are experiencing: °· Only take over-the-counter or prescription medicines as directed by your health care provider. °· Do not take laxatives unless directed to do so by your health care provider. °· Try a clear liquid diet (broth, tea, or water) as directed by your health care provider. Slowly move to a bland diet as tolerated. °SEEK MEDICAL CARE IF: °· You have unexplained abdominal pain. °· You have abdominal pain associated with nausea or diarrhea. °· You have pain when you urinate or have a bowel movement. °· You experience abdominal pain that wakes you in the night. °· You have abdominal pain that is worsened or improved by eating food. °· You have abdominal pain that is worsened with eating fatty foods. °SEEK IMMEDIATE MEDICAL CARE IF:  °· Your pain does not go away within 2 hours. °· You have a fever. °· You keep throwing up (vomiting). °· Your pain is felt only in portions of the abdomen, such as the right side or the left lower portion of the  abdomen. °· You pass bloody or black tarry stools. °MAKE SURE YOU: °· Understand these instructions.   °· Will watch your condition.   °· Will get help right away if you are not doing well or get worse.   °Document Released: 06/24/2005 Document Revised: 07/05/2013 Document Reviewed: 05/24/2013 °[employer]® Patient Information ©2014 [employer]. ° °

## 2014-01-29 NOTE — ED Provider Notes (Signed)
CSN: [id number]     Arrival date & time 01/29/14  1741 History   First MD Initiated Contact with Patient 01/29/14 1930     Chief Complaint  Patient presents with  . Abdominal Pain     (Consider location/radiation/quality/duration/timing/severity/associated sxs/prior Treatment) HPI Patient presents with new abdominal pain. Patient one similar episode one week ago, though it was brief, mild compared to today's pain. This episode began yesterday.  Since onset he has had waxing/any pain that has been increasing in severity.  The pain is focally in the right flank with radiation towards the right inguinal crease. There is occasional dysuria, but no scrotal swelling, penis pain. No fevers, chills, nausea, vomiting, diarrhea.  Past Medical History  Diagnosis Date  . Arthritis   . Allergy   . Hypertension   . Hyperlipidemia   . Prostatitis   . Chronic insomnia    History reviewed. No pertinent past surgical history. Family History  Problem Relation Age of Onset  . Arthritis Mother     rheumatoid  . Hyperlipidemia Mother   . Hypertension Mother   . Cancer Mother     rectal cancer  . Arthritis Father   . Hypertension Father   . Arthritis Maternal Grandmother   . Arthritis Maternal Grandfather   . Arthritis Paternal Grandmother   . Arthritis Paternal Grandfather    History  Substance Use Topics  . Smoking status: Never Smoker   . Smokeless tobacco: Not on file  . Alcohol Use: Not on file    Review of Systems  Constitutional:       Per HPI, otherwise negative  HENT:       Per HPI, otherwise negative  Respiratory:       Per HPI, otherwise negative  Cardiovascular:       Per HPI, otherwise negative  Gastrointestinal: Negative for vomiting.  Endocrine:       Negative aside from HPI  Genitourinary:       Neg aside from HPI   Musculoskeletal:       Per HPI, otherwise negative  Skin: Negative.   Neurological: Negative for syncope.      Allergies  Review of  patient's allergies indicates no known allergies.  Home Medications   Prior to Admission medications   Medication Sig Start Date End Date Taking? Authorizing Provider  amitriptyline (ELAVIL) 50 MG tablet Take 1 tablet (50 mg total) by mouth at bedtime. 02/23/13  Yes Eulas Post, MD  atenolol (TENORMIN) 50 MG tablet Take 1 tablet (50 mg total) by mouth daily. 02/23/13  Yes Eulas Post, MD  ibuprofen (ADVIL,MOTRIN) 200 MG tablet Take 400 mg by mouth every 6 (six) hours as needed (pain).   Yes Historical Provider, MD  lisinopril (PRINIVIL,ZESTRIL) 40 MG tablet Take 1 tablet (40 mg total) by mouth daily. 02/23/13  Yes Eulas Post, MD  meloxicam (MOBIC) 15 MG tablet Take 15 mg by mouth as needed (pain). Per Dr Pervis Hocking, urologist 06/18/12  Yes Historical Provider, MD  pantoprazole (PROTONIX) 40 MG tablet Take 1 tablet (40 mg total) by mouth daily. 02/23/13  Yes Eulas Post, MD  sildenafil (VIAGRA) 100 MG tablet Take 100 mg by mouth daily as needed (erectile dysfuncation). 02/23/13  Yes Eulas Post, MD  simvastatin (ZOCOR) 20 MG tablet Take 1 tablet (20 mg total) by mouth every evening. 02/23/13  Yes Eulas Post, MD   BP 136/84  Pulse 71  Temp(Src) 98 F (36.7 C) (Oral)  Resp 16  SpO2 100% Physical Exam  Nursing note and vitals reviewed. Constitutional: He is oriented to person, place, and time. He appears well-developed. No distress.  HENT:  Head: Normocephalic and atraumatic.  Eyes: Conjunctivae and EOM are normal.  Cardiovascular: Normal rate and regular rhythm.   Pulmonary/Chest: Effort normal. No stridor. No respiratory distress.  Abdominal: He exhibits no distension.  Minimal tenderness to palpation, with no guarding. The patient states that the pain is focally in his right lateral inguinal crease. No appreciable hernia.  Musculoskeletal: He exhibits no edema.  Neurological: He is alert and oriented to person, place, and time.  Skin: Skin is warm and  dry.  Psychiatric: He has a normal mood and affect.    ED Course  Procedures (including critical care time) Labs Review Labs Reviewed  CBC WITH DIFFERENTIAL - Abnormal; Notable for the following:    Eosinophils Relative 6 (*)    All other components within normal limits  COMPREHENSIVE METABOLIC PANEL - Abnormal; Notable for the following:    GFR calc non Af Amer 87 (*)    All other components within normal limits  URINALYSIS, ROUTINE W REFLEX MICROSCOPIC    Imaging Review Ct Abdomen Pelvis Wo Contrast  01/29/2014   CLINICAL DATA:  R flank pain, RLQ pain  EXAM: CT ABDOMEN AND PELVIS WITHOUT CONTRAST  TECHNIQUE: Multidetector CT imaging of the abdomen and pelvis was performed following the standard protocol without IV contrast.  COMPARISON:  None.  FINDINGS: Mild dependent atelectasis seen within the visualized lung bases.  Limited noncontrast evaluation of the liver is unremarkable. Gallbladder within normal limits. No biliary ductal dilatation. The spleen, adrenal glands, and pancreas demonstrate a normal unenhanced appearance.  The kidneys are symmetric in size without evidence of nephrolithiasis or hydronephrosis. No stones seen along the course of either renal collecting system, and there is no hydroureter.  No evidence of bowel obstruction. No abnormal wall thickening or inflammatory fat stranding seen about the bowels. Appendix is within normal limits.  Bladder is unremarkable. Prostate is mildly enlarged measuring 5.3 cm in maximal diameter.  No free air or fluid. No enlarged intra-abdominal or pelvic lymph nodes. Moderate aorto bi-iliac atherosclerotic calcifications present. Retro aortic left renal vein noted. No intra-abdominal aneurysm.  No acute osseous abnormality. No worrisome lytic or blastic osseous lesions. 4 mm retrolisthesis of L5 on S1 present with severe degenerative intervertebral disc space narrowing in diffuse disc bulge.  IMPRESSION: 1. No CT evidence of nephrolithiasis or  obstructive uropathy. 2. No other acute intra-abdominal or pelvic process identified. Normal appendix. 3. Enlarged prostate.   Electronically Signed   By: Jeannine Boga M.D.   On: 01/29/2014 20:47    9:32 PM Patient is in no distress.  He, his wife and I discussed the CT images, as I demonstrated that the patient.  Patient will follow up with primary care urology, chiropractic  MDM   Patient presents with episodic right flank and right lower quadrant pain.  Patient is soft, nontender to abdomen, both his description of worsening pain was evaluated with labs, CT scan.  Findings were largely reassuring, the patient has evidence of both constipation and disc dysfunction L5, S1. Patient will follow up for further evaluation, management, but is in no distress, appropriate for further evaluation and management as an outpatient.   Carmin Muskrat, MD 01/29/14 2133

## 2014-01-29 NOTE — ED Notes (Signed)
Patient transported to CT 

## 2014-01-29 NOTE — ED Notes (Signed)
Pt states he began to have "catch" in RLQ abd wall. Pt states pain has increased and goes from R flank though groin. Denies dysuria, strenuous activity or heavy lifting.

## 2014-01-29 NOTE — ED Notes (Signed)
Pt ambulatory to exam room with steady gait.  

## 2014-02-20 ENCOUNTER — Other Ambulatory Visit (INDEPENDENT_AMBULATORY_CARE_PROVIDER_SITE_OTHER): Payer: Managed Care, Other (non HMO)

## 2014-02-20 DIAGNOSIS — Z0000 Encounter for general adult medical examination without abnormal findings: Secondary | ICD-10-CM

## 2014-02-20 LAB — HEPATIC FUNCTION PANEL
ALK PHOS: 51 U/L (ref 39–117)
ALT: 22 U/L (ref 0–53)
AST: 22 U/L (ref 0–37)
Albumin: 3.6 g/dL (ref 3.5–5.2)
BILIRUBIN DIRECT: 0.1 mg/dL (ref 0.0–0.3)
BILIRUBIN TOTAL: 0.7 mg/dL (ref 0.2–1.2)
Total Protein: 6 g/dL (ref 6.0–8.3)

## 2014-02-20 LAB — CBC WITH DIFFERENTIAL/PLATELET
BASOS ABS: 0.1 10*3/uL (ref 0.0–0.1)
Basophils Relative: 1.2 % (ref 0.0–3.0)
EOS ABS: 0.4 10*3/uL (ref 0.0–0.7)
Eosinophils Relative: 8.8 % — ABNORMAL HIGH (ref 0.0–5.0)
HCT: 44.5 % (ref 39.0–52.0)
Hemoglobin: 15.5 g/dL (ref 13.0–17.0)
Lymphocytes Relative: 28 % (ref 12.0–46.0)
Lymphs Abs: 1.2 10*3/uL (ref 0.7–4.0)
MCHC: 34.8 g/dL (ref 30.0–36.0)
MCV: 91.5 fl (ref 78.0–100.0)
Monocytes Absolute: 0.4 10*3/uL (ref 0.1–1.0)
Monocytes Relative: 9.1 % (ref 3.0–12.0)
NEUTROS PCT: 52.9 % (ref 43.0–77.0)
Neutro Abs: 2.3 10*3/uL (ref 1.4–7.7)
PLATELETS: 176 10*3/uL (ref 150.0–400.0)
RBC: 4.86 Mil/uL (ref 4.22–5.81)
RDW: 12.6 % (ref 11.5–15.5)
WBC: 4.4 10*3/uL (ref 4.0–10.5)

## 2014-02-20 LAB — POCT URINALYSIS DIPSTICK
BILIRUBIN UA: NEGATIVE
Blood, UA: NEGATIVE
GLUCOSE UA: NEGATIVE
KETONES UA: NEGATIVE
LEUKOCYTES UA: NEGATIVE
Nitrite, UA: NEGATIVE
Protein, UA: NEGATIVE
Spec Grav, UA: 1.01
Urobilinogen, UA: 0.2
pH, UA: 6.5

## 2014-02-20 LAB — BASIC METABOLIC PANEL
BUN: 14 mg/dL (ref 6–23)
CALCIUM: 8.6 mg/dL (ref 8.4–10.5)
CO2: 28 meq/L (ref 19–32)
CREATININE: 1 mg/dL (ref 0.4–1.5)
Chloride: 99 mEq/L (ref 96–112)
GFR: 81.71 mL/min (ref >60.00)
Glucose, Bld: 97 mg/dL (ref 70–99)
Potassium: 4.3 mEq/L (ref 3.5–5.1)
Sodium: 134 mEq/L — ABNORMAL LOW (ref 135–145)

## 2014-02-20 LAB — LIPID PANEL
Cholesterol: 138 mg/dL (ref 0–200)
HDL: 29.1 mg/dL — ABNORMAL LOW (ref >39.00)
LDL Cholesterol: 22 mg/dL (ref 0–99)
Total CHOL/HDL Ratio: 5
Triglycerides: 436 mg/dL — ABNORMAL HIGH (ref 0.0–149.0)
VLDL: 87.2 mg/dL — AB (ref 0.0–40.0)

## 2014-02-20 LAB — TSH: TSH: 1.45 u[IU]/mL (ref 0.35–4.50)

## 2014-02-20 LAB — PSA: PSA: 2.88 ng/mL (ref 0.10–4.00)

## 2014-02-26 ENCOUNTER — Encounter: Payer: Managed Care, Other (non HMO) | Admitting: Family Medicine

## 2014-02-28 ENCOUNTER — Other Ambulatory Visit: Payer: Self-pay

## 2014-02-28 ENCOUNTER — Ambulatory Visit (INDEPENDENT_AMBULATORY_CARE_PROVIDER_SITE_OTHER): Payer: Managed Care, Other (non HMO) | Admitting: Family Medicine

## 2014-02-28 ENCOUNTER — Encounter: Payer: Self-pay | Admitting: Family Medicine

## 2014-02-28 VITALS — BP 126/78 | HR 84 | Temp 98.5°F | Ht 70.0 in | Wt 195.0 lb

## 2014-02-28 DIAGNOSIS — Z0000 Encounter for general adult medical examination without abnormal findings: Secondary | ICD-10-CM

## 2014-02-28 DIAGNOSIS — Z23 Encounter for immunization: Secondary | ICD-10-CM

## 2014-02-28 MED ORDER — PANTOPRAZOLE SODIUM 40 MG PO TBEC: 40.0000 mg | DELAYED_RELEASE_TABLET | Freq: Every day | ORAL | Status: DC

## 2014-02-28 MED ORDER — ATENOLOL 50 MG PO TABS: 50.0000 mg | ORAL_TABLET | Freq: Every day | ORAL | Status: DC

## 2014-02-28 MED ORDER — SIMVASTATIN 20 MG PO TABS: 20.0000 mg | ORAL_TABLET | Freq: Every evening | ORAL | Status: DC

## 2014-02-28 MED ORDER — LISINOPRIL 40 MG PO TABS: 40.0000 mg | ORAL_TABLET | Freq: Every day | ORAL | Status: DC

## 2014-02-28 MED ORDER — AMITRIPTYLINE HCL 50 MG PO TABS: 50.0000 mg | ORAL_TABLET | Freq: Every day | ORAL | Status: DC

## 2014-02-28 NOTE — Progress Notes (Signed)
Pre visit review using our clinic review tool, if applicable. No additional management support is needed unless otherwise documented below in the visit note. 

## 2014-02-28 NOTE — Patient Instructions (Signed)
Hypertriglyceridemia  Diet for High blood levels of Triglycerides Most fats in food are triglycerides. Triglycerides in your blood are stored as fat in your body. High levels of triglycerides in your blood may put you at a greater risk for heart disease and stroke.  Normal triglyceride levels are less than 150 mg/dL. Borderline high levels are 150-199 mg/dl. High levels are 200 - 499 mg/dL, and very high triglyceride levels are greater than 500 mg/dL. The decision to treat high triglycerides is generally based on the level. For people with borderline or high triglyceride levels, treatment includes weight loss and exercise. Drugs are recommended for people with very high triglyceride levels. Many people who need treatment for high triglyceride levels have metabolic syndrome. This syndrome is a collection of disorders that often include: insulin resistance, high blood pressure, blood clotting problems, high cholesterol and triglycerides. TESTING PROCEDURE FOR TRIGLYCERIDES  You should not eat 4 hours before getting your triglycerides measured. The normal range of triglycerides is between 10 and 250 milligrams per deciliter (mg/dl). Some people may have extreme levels (1000 or above), but your triglyceride level may be too high if it is above 150 mg/dl, depending on what other risk factors you have for heart disease.  People with high blood triglycerides may also have high blood cholesterol levels. If you have high blood cholesterol as well as high blood triglycerides, your risk for heart disease is probably greater than if you only had high triglycerides. High blood cholesterol is one of the main risk factors for heart disease. CHANGING YOUR DIET  Your weight can affect your blood triglyceride level. If you are more than 20% above your ideal body weight, you may be able to lower your blood triglycerides by losing weight. Eating less and exercising regularly is the best way to combat this. Fat provides more  calories than any other food. The best way to lose weight is to eat less fat. Only 30% of your total calories should come from fat. Less than 7% of your diet should come from saturated fat. A diet low in fat and saturated fat is the same as a diet to decrease blood cholesterol. By eating a diet lower in fat, you may lose weight, lower your blood cholesterol, and lower your blood triglyceride level.  Eating a diet low in fat, especially saturated fat, may also help you lower your blood triglyceride level. Ask your dietitian to help you figure how much fat you can eat based on the number of calories your caregiver has prescribed for you.  Exercise, in addition to helping with weight loss may also help lower triglyceride levels.   Alcohol can increase blood triglycerides. You may need to stop drinking alcoholic beverages.  Too much carbohydrate in your diet may also increase your blood triglycerides. Some complex carbohydrates are necessary in your diet. These may include bread, rice, potatoes, other starchy vegetables and cereals.  Reduce "simple" carbohydrates. These may include pure sugars, candy, honey, and jelly without losing other nutrients. If you have the kind of high blood triglycerides that is affected by the amount of carbohydrates in your diet, you will need to eat less sugar and less high-sugar foods. Your caregiver can help you with this.  Adding 2-4 grams of fish oil (EPA+ DHA) may also help lower triglycerides. Speak with your caregiver before adding any supplements to your regimen. Following the Diet  Maintain your ideal weight. Your caregivers can help you with a diet. Generally, eating less food and getting more   exercise will help you lose weight. Joining a weight control group may also help. Ask your caregivers for a good weight control group in your area.  Eat low-fat foods instead of high-fat foods. This can help you lose weight too.  These foods are lower in fat. Eat MORE of these:    Dried beans, peas, and lentils.  Egg whites.  Low-fat cottage cheese.  Fish.  Lean cuts of meat, such as round, sirloin, rump, and flank (cut extra fat off meat you fix).  Whole grain breads, cereals and pasta.  Skim and nonfat dry milk.  Low-fat yogurt.  Poultry without the skin.  Cheese made with skim or part-skim milk, such as mozzarella, parmesan, farmers', ricotta, or pot cheese. These are higher fat foods. Eat LESS of these:   Whole milk and foods made from whole milk, such as American, blue, cheddar, monterey jack, and swiss cheese  High-fat meats, such as luncheon meats, sausages, knockwurst, bratwurst, hot dogs, ribs, corned beef, ground pork, and regular ground beef.  Fried foods. Limit saturated fats in your diet. Substituting unsaturated fat for saturated fat may decrease your blood triglyceride level. You will need to read package labels to know which products contain saturated fats.  These foods are high in saturated fat. Eat LESS of these:   Fried pork skins.  Whole milk.  Skin and fat from poultry.  Palm oil.  Butter.  Shortening.  Cream cheese.  Bacon.  Margarines and baked goods made from listed oils.  Vegetable shortenings.  Chitterlings.  Fat from meats.  Coconut oil.  Palm kernel oil.  Lard.  Cream.  Sour cream.  Fatback.  Coffee whiteners and non-dairy creamers made with these oils.  Cheese made from whole milk. Use unsaturated fats (both polyunsaturated and monounsaturated) moderately. Remember, even though unsaturated fats are better than saturated fats; you still want a diet low in total fat.  These foods are high in unsaturated fat:   Canola oil.  Sunflower oil.  Mayonnaise.  Almonds.  Peanuts.  Pine nuts.  Margarines made with these oils.  Safflower oil.  Olive oil.  Avocados.  Cashews.  Peanut butter.  Sunflower seeds.  Soybean oil.  Peanut  oil.  Olives.  Pecans.  Walnuts.  Pumpkin seeds. Avoid sugar and other high-sugar foods. This will decrease carbohydrates without decreasing other nutrients. Sugar in your food goes rapidly to your blood. When there is excess sugar in your blood, your liver may use it to make more triglycerides. Sugar also contains calories without other important nutrients.  Eat LESS of these:   Sugar, brown sugar, powdered sugar, jam, jelly, preserves, honey, syrup, molasses, pies, candy, cakes, cookies, frosting, pastries, colas, soft drinks, punches, fruit drinks, and regular gelatin.  Avoid alcohol. Alcohol, even more than sugar, may increase blood triglycerides. In addition, alcohol is high in calories and low in nutrients. Ask for sparkling water, or a diet soft drink instead of an alcoholic beverage. Suggestions for planning and preparing meals   Bake, broil, grill or roast meats instead of frying.  Remove fat from meats and skin from poultry before cooking.  Add spices, herbs, lemon juice or vinegar to vegetables instead of salt, rich sauces or gravies.  Use a non-stick skillet without fat or use no-stick sprays.  Cool and refrigerate stews and broth. Then remove the hardened fat floating on the surface before serving.  Refrigerate meat drippings and skim off fat to make low-fat gravies.  Serve more fish.  Use less butter,   margarine and other high-fat spreads on bread or vegetables.  Use skim or reconstituted non-fat dry milk for cooking.  Cook with low-fat cheeses.  Substitute low-fat yogurt or cottage cheese for all or part of the sour cream in recipes for sauces, dips or congealed salads.  Use half yogurt/half mayonnaise in salad recipes.  Substitute evaporated skim milk for cream. Evaporated skim milk or reconstituted non-fat dry milk can be whipped and substituted for whipped cream in certain recipes.  Choose fresh fruits for dessert instead of high-fat foods such as pies or  cakes. Fruits are naturally low in fat. When Dining Out   Order low-fat appetizers such as fruit or vegetable juice, pasta with vegetables or tomato sauce.  Select clear, rather than cream soups.  Ask that dressings and gravies be served on the side. Then use less of them.  Order foods that are baked, broiled, poached, steamed, stir-fried, or roasted.  Ask for margarine instead of butter, and use only a small amount.  Drink sparkling water, unsweetened tea or coffee, or diet soft drinks instead of alcohol or other sweet beverages. QUESTIONS AND ANSWERS ABOUT OTHER FATS IN THE BLOOD: SATURATED FAT, TRANS FAT, AND CHOLESTEROL What is trans fat? Trans fat is a type of fat that is formed when vegetable oil is hardened through a process called hydrogenation. This process helps makes foods more solid, gives them shape, and prolongs their shelf life. Trans fats are also called hydrogenated or partially hydrogenated oils.  What do saturated fat, trans fat, and cholesterol in foods have to do with heart disease? Saturated fat, trans fat, and cholesterol in the diet all raise the level of LDL "bad" cholesterol in the blood. The higher the LDL cholesterol, the greater the risk for coronary heart disease (CHD). Saturated fat and trans fat raise LDL similarly.  What foods contain saturated fat, trans fat, and cholesterol? High amounts of saturated fat are found in animal products, such as fatty cuts of meat, chicken skin, and full-fat dairy products like butter, whole milk, cream, and cheese, and in tropical vegetable oils such as palm, palm kernel, and coconut oil. Trans fat is found in some of the same foods as saturated fat, such as vegetable shortening, some margarines (especially hard or stick margarine), crackers, cookies, baked goods, fried foods, salad dressings, and other processed foods made with partially hydrogenated vegetable oils. Small amounts of trans fat also occur naturally in some animal  products, such as milk products, beef, and lamb. Foods high in cholesterol include liver, other organ meats, egg yolks, shrimp, and full-fat dairy products. How can I use the new food label to make heart-healthy food choices? Check the Nutrition Facts panel of the food label. Choose foods lower in saturated fat, trans fat, and cholesterol. For saturated fat and cholesterol, you can also use the Percent Daily Value (%DV): 5% DV or less is low, and 20% DV or more is high. (There is no %DV for trans fat.) Use the Nutrition Facts panel to choose foods low in saturated fat and cholesterol, and if the trans fat is not listed, read the ingredients and limit products that list shortening or hydrogenated or partially hydrogenated vegetable oil, which tend to be high in trans fat. POINTS TO REMEMBER:   Discuss your risk for heart disease with your caregivers, and take steps to reduce risk factors.  Change your diet. Choose foods that are low in saturated fat, trans fat, and cholesterol.  Add exercise to your daily routine if   it is not already being done. Participate in physical activity of moderate intensity, like brisk walking, for at least 30 minutes on most, and preferably all days of the week. No time? Break the 30 minutes into three, 10-minute segments during the day.  Stop smoking. If you do smoke, contact your caregiver to discuss ways in which they can help you quit.  Do not use street drugs.  Maintain a normal weight.  Maintain a healthy blood pressure.  Keep up with your blood work for checking the fats in your blood as directed by your caregiver. Document Released: 07/02/2004 Document Revised: 03/15/2012 Document Reviewed: 01/28/2009 [facility] Patient Information 2014 [place].  Call gastroenterology to set up followup colonoscopy

## 2014-02-28 NOTE — Addendum Note (Signed)
Addended by: Marcina Millard on: 02/28/2014 10:33 AM   Modules accepted: Orders

## 2014-02-28 NOTE — Progress Notes (Signed)
   Subjective:    Patient ID: Adam Villanueva, male    DOB: May 22, 1951, 63 y.o.   MRN: [id number]  HPI Patient here for complete physical. He has chronic problems including dyslipidemia, hypertension, GERD, BPH, osteoarthritis, chronic insomnia. He sees urologist regularly and has followup with them soon. His mother is battling with rectal cancer and lives with patient. He's had a lot of stress dealing with that. Patient had last colonoscopy back in 2008. Tetanus up-to-date. No history of shingles vaccine. Requesting refills of all medications. Compliant with all. No side effects.  History of high triglycerides. Takes omega-3 supplement. Nonsmoker. No cardiac history.  Past Medical History  Diagnosis Date  . Arthritis   . Allergy   . Hypertension   . Hyperlipidemia   . Prostatitis   . Chronic insomnia    No past surgical history on file.  reports that he has never smoked. He does not have any smokeless tobacco history on file. His alcohol and drug histories are not on file. family history includes Arthritis in his father, maternal grandfather, maternal grandmother, mother, paternal grandfather, and paternal grandmother; Cancer in his mother; Hyperlipidemia in his mother; Hypertension in his father and mother. No Known Allergies    Review of Systems  Constitutional: Positive for fatigue. Negative for fever, activity change and appetite change.  HENT: Negative for congestion, ear pain and trouble swallowing.   Eyes: Negative for pain and visual disturbance.  Respiratory: Negative for cough, shortness of breath and wheezing.   Cardiovascular: Negative for chest pain and palpitations.  Gastrointestinal: Negative for nausea, vomiting, abdominal pain, diarrhea, constipation, blood in stool, abdominal distention and rectal pain.  Genitourinary: Negative for dysuria, hematuria and testicular pain.  Musculoskeletal: Positive for arthralgias. Negative for joint swelling.  Skin: Negative for  rash.  Neurological: Negative for dizziness, syncope and headaches.  Hematological: Negative for adenopathy.  Psychiatric/Behavioral: Negative for confusion and dysphoric mood.       Objective:   Physical Exam  Constitutional: He is oriented to person, place, and time. He appears well-developed and well-nourished. No distress.  HENT:  Head: Normocephalic and atraumatic.  Right Ear: External ear normal.  Left Ear: External ear normal.  Mouth/Throat: Oropharynx is clear and moist.  Eyes: Conjunctivae and EOM are normal. Pupils are equal, round, and reactive to light.  Neck: Normal range of motion. Neck supple. No thyromegaly present.  Cardiovascular: Normal rate, regular rhythm and normal heart sounds.   No murmur heard. Pulmonary/Chest: No respiratory distress. He has no wheezes. He has no rales.  Abdominal: Soft. Bowel sounds are normal. He exhibits no distension and no mass. There is no tenderness. There is no rebound and no guarding.  Genitourinary:  Per urology  Musculoskeletal: He exhibits no edema.  Lymphadenopathy:    He has no cervical adenopathy.  Neurological: He is alert and oriented to person, place, and time. He displays normal reflexes. No cranial nerve deficit.  Skin: No rash noted.  Psychiatric: He has a normal mood and affect.          Assessment & Plan:  Complete physical. Shingles vaccine per his request. Handout on high triglycerides given. Patient will call to set up GI referral. Positive family history colon cancer first degree relative and over 7 years since last scope.

## 2014-03-06 ENCOUNTER — Telehealth: Payer: Self-pay | Admitting: Family Medicine

## 2014-03-06 NOTE — Telephone Encounter (Signed)
Pt would like to confirm you received health wellness screening form and it should be faxed to [facility]

## 2014-03-07 NOTE — Telephone Encounter (Signed)
Pt aware that form is completed and has been faxed.

## 2014-04-25 ENCOUNTER — Ambulatory Visit: Payer: Managed Care, Other (non HMO) | Attending: Urology | Admitting: Physical Therapy

## 2014-04-25 DIAGNOSIS — R350 Frequency of micturition: Secondary | ICD-10-CM | POA: Insufficient documentation

## 2014-04-25 DIAGNOSIS — R5381 Other malaise: Secondary | ICD-10-CM | POA: Insufficient documentation

## 2014-04-25 DIAGNOSIS — M629 Disorder of muscle, unspecified: Secondary | ICD-10-CM | POA: Insufficient documentation

## 2014-04-25 DIAGNOSIS — M545 Low back pain, unspecified: Secondary | ICD-10-CM | POA: Insufficient documentation

## 2014-04-25 DIAGNOSIS — I10 Essential (primary) hypertension: Secondary | ICD-10-CM | POA: Insufficient documentation

## 2014-04-25 DIAGNOSIS — IMO0001 Reserved for inherently not codable concepts without codable children: Secondary | ICD-10-CM | POA: Diagnosis present

## 2014-04-25 DIAGNOSIS — M2420 Disorder of ligament, unspecified site: Secondary | ICD-10-CM | POA: Insufficient documentation

## 2014-05-01 ENCOUNTER — Ambulatory Visit: Payer: Managed Care, Other (non HMO) | Attending: Urology | Admitting: Physical Therapy

## 2014-05-01 DIAGNOSIS — M545 Low back pain, unspecified: Secondary | ICD-10-CM | POA: Diagnosis not present

## 2014-05-01 DIAGNOSIS — IMO0001 Reserved for inherently not codable concepts without codable children: Secondary | ICD-10-CM | POA: Diagnosis not present

## 2014-05-01 DIAGNOSIS — R350 Frequency of micturition: Secondary | ICD-10-CM | POA: Insufficient documentation

## 2014-05-01 DIAGNOSIS — R5381 Other malaise: Secondary | ICD-10-CM | POA: Diagnosis not present

## 2014-05-01 DIAGNOSIS — I10 Essential (primary) hypertension: Secondary | ICD-10-CM | POA: Insufficient documentation

## 2014-05-01 DIAGNOSIS — M2420 Disorder of ligament, unspecified site: Secondary | ICD-10-CM | POA: Insufficient documentation

## 2014-05-01 DIAGNOSIS — M629 Disorder of muscle, unspecified: Secondary | ICD-10-CM | POA: Insufficient documentation

## 2014-05-16 ENCOUNTER — Ambulatory Visit: Payer: Managed Care, Other (non HMO) | Admitting: Physical Therapy

## 2014-05-16 DIAGNOSIS — IMO0001 Reserved for inherently not codable concepts without codable children: Secondary | ICD-10-CM | POA: Diagnosis not present

## 2014-06-05 ENCOUNTER — Ambulatory Visit: Payer: Managed Care, Other (non HMO) | Attending: Urology | Admitting: Physical Therapy

## 2014-06-05 DIAGNOSIS — R5381 Other malaise: Secondary | ICD-10-CM | POA: Diagnosis not present

## 2014-06-05 DIAGNOSIS — M2420 Disorder of ligament, unspecified site: Secondary | ICD-10-CM | POA: Insufficient documentation

## 2014-06-05 DIAGNOSIS — M545 Low back pain, unspecified: Secondary | ICD-10-CM | POA: Diagnosis not present

## 2014-06-05 DIAGNOSIS — R350 Frequency of micturition: Secondary | ICD-10-CM | POA: Insufficient documentation

## 2014-06-05 DIAGNOSIS — M629 Disorder of muscle, unspecified: Secondary | ICD-10-CM | POA: Insufficient documentation

## 2014-06-05 DIAGNOSIS — IMO0001 Reserved for inherently not codable concepts without codable children: Secondary | ICD-10-CM | POA: Diagnosis present

## 2014-06-05 DIAGNOSIS — I10 Essential (primary) hypertension: Secondary | ICD-10-CM | POA: Diagnosis not present

## 2014-06-19 ENCOUNTER — Ambulatory Visit (INDEPENDENT_AMBULATORY_CARE_PROVIDER_SITE_OTHER): Payer: Managed Care, Other (non HMO) | Admitting: Family Medicine

## 2014-06-19 ENCOUNTER — Encounter: Payer: Self-pay | Admitting: Family Medicine

## 2014-06-19 VITALS — BP 130/80 | HR 88 | Wt 193.0 lb

## 2014-06-19 DIAGNOSIS — Z23 Encounter for immunization: Secondary | ICD-10-CM

## 2014-06-19 DIAGNOSIS — M2550 Pain in unspecified joint: Secondary | ICD-10-CM

## 2014-06-19 LAB — SEDIMENTATION RATE: Sed Rate: 6 mm/hr (ref 0–22)

## 2014-06-19 NOTE — Progress Notes (Signed)
   Subjective:    Patient ID: Adam Villanueva, male    DOB: [birth year], 63 y.o.   MRN: [id number]  Hip Pain    Patient seen with 2 week history of bilateral hip and shoulder pains and low back pain. Low back pain somewhat more chronic. He had somewhat similar episode of polyarthralgias back in July 2014. Screening labs at that point were unremarkable. He has also noted some increased fatigue. He had a couple tick bites over the summer but denied any rash and no travels to high risk areas for Lyme Disease.. Mom had rheumatoid arthritis. He has not seen objective evidence for inflammation such as redness, warmth, or swelling. He took some meloxicam 2 days ago which helps slightly. His mom is battling colon cancer and is on hospice and he has been under tremendous stress with working and taking care of her. Poor sleep at times.  Past Medical History  Diagnosis Date  . Arthritis   . Allergy   . Hypertension   . Hyperlipidemia   . Prostatitis   . Chronic insomnia    No past surgical history on file.  reports that he has never smoked. He does not have any smokeless tobacco history on file. His alcohol and drug histories are not on file. family history includes Arthritis in his father, maternal grandfather, maternal grandmother, mother, paternal grandfather, and paternal grandmother; Cancer in his mother; Hyperlipidemia in his mother; Hypertension in his father and mother. No Known Allergies    Review of Systems  Constitutional: Positive for fatigue. Negative for fever and chills.  Respiratory: Negative for shortness of breath.   Cardiovascular: Negative for chest pain.  Musculoskeletal: Positive for arthralgias and back pain.  Skin: Negative for rash.  Neurological: Negative for headaches.  Hematological: Negative for adenopathy. Does not bruise/bleed easily.       Objective:   Physical Exam  Constitutional: He appears well-developed and well-nourished. No distress.  Cardiovascular:  Normal rate and regular rhythm.   No murmur heard. Pulmonary/Chest: Effort normal and breath sounds normal. No respiratory distress. He has no wheezes. He has no rales.  Musculoskeletal: He exhibits no edema.  Full range of motion all joints including hips and shoulders. No evidence for inflammatory changes such as erythema or warmth. No localizing tenderness  He has not had any typical fibromyalgia tender points  Skin: No rash noted.          Assessment & Plan:  Polyarthralgias mostly involving hips and shoulders. Doubt PMR. No evidence for fibromyalgia. No evidence for objective inflammatory changes. Check sedimentation rate. Continue meloxicam with discussion for possible side effects.

## 2014-06-19 NOTE — Progress Notes (Signed)
Pre visit review using our clinic review tool, if applicable. No additional management support is needed unless otherwise documented below in the visit note. 

## 2014-06-19 NOTE — Patient Instructions (Signed)
Get back on Meloxicam one daily Follow up for any evidence for joint inflammation such as redness, swelling , or warmth.

## 2014-06-26 ENCOUNTER — Ambulatory Visit: Payer: Managed Care, Other (non HMO) | Admitting: Physical Therapy

## 2014-06-26 DIAGNOSIS — IMO0001 Reserved for inherently not codable concepts without codable children: Secondary | ICD-10-CM | POA: Diagnosis not present

## 2014-07-16 ENCOUNTER — Ambulatory Visit: Payer: Managed Care, Other (non HMO) | Admitting: Physical Therapy

## 2014-07-24 ENCOUNTER — Ambulatory Visit: Payer: Managed Care, Other (non HMO) | Attending: Urology | Admitting: Physical Therapy

## 2014-07-24 DIAGNOSIS — M545 Low back pain: Secondary | ICD-10-CM | POA: Diagnosis not present

## 2014-07-24 DIAGNOSIS — R29898 Other symptoms and signs involving the musculoskeletal system: Secondary | ICD-10-CM | POA: Insufficient documentation

## 2014-07-24 DIAGNOSIS — R5381 Other malaise: Secondary | ICD-10-CM | POA: Diagnosis not present

## 2014-08-13 ENCOUNTER — Ambulatory Visit: Payer: Managed Care, Other (non HMO) | Admitting: Physical Therapy

## 2014-09-17 ENCOUNTER — Other Ambulatory Visit: Payer: Self-pay | Admitting: Rheumatology

## 2014-09-17 DIAGNOSIS — M5416 Radiculopathy, lumbar region: Secondary | ICD-10-CM

## 2014-09-22 ENCOUNTER — Ambulatory Visit
Admission: RE | Admit: 2014-09-22 | Discharge: 2014-09-22 | Disposition: A | Discharge status: Home / Self Care | Payer: Managed Care, Other (non HMO) | Source: Ambulatory Visit | Attending: Rheumatology | Admitting: Rheumatology

## 2014-09-22 DIAGNOSIS — M5416 Radiculopathy, lumbar region: Secondary | ICD-10-CM

## 2014-10-01 ENCOUNTER — Other Ambulatory Visit: Payer: Self-pay | Admitting: Rheumatology

## 2014-10-01 DIAGNOSIS — M545 Low back pain, unspecified: Secondary | ICD-10-CM

## 2014-10-01 DIAGNOSIS — M5126 Other intervertebral disc displacement, lumbar region: Secondary | ICD-10-CM

## 2014-10-01 DIAGNOSIS — M5416 Radiculopathy, lumbar region: Secondary | ICD-10-CM

## 2014-10-04 ENCOUNTER — Other Ambulatory Visit: Payer: Managed Care, Other (non HMO)

## 2014-10-08 ENCOUNTER — Ambulatory Visit
Admission: RE | Admit: 2014-10-08 | Discharge: 2014-10-08 | Disposition: A | Discharge status: Home / Self Care | Payer: Managed Care, Other (non HMO) | Source: Ambulatory Visit | Attending: Rheumatology | Admitting: Rheumatology

## 2014-10-08 DIAGNOSIS — M5416 Radiculopathy, lumbar region: Secondary | ICD-10-CM

## 2014-10-08 DIAGNOSIS — M545 Low back pain, unspecified: Secondary | ICD-10-CM

## 2014-10-08 DIAGNOSIS — M5126 Other intervertebral disc displacement, lumbar region: Secondary | ICD-10-CM

## 2014-10-08 MED ORDER — IOHEXOL 180 MG/ML  SOLN
1.0000 mL | Freq: Once | INTRAMUSCULAR | Status: AC | PRN
  Administered 2014-10-08: 1 mL via EPIDURAL

## 2014-10-08 MED ORDER — METHYLPREDNISOLONE ACETATE 40 MG/ML INJ SUSP (RADIOLOG
120.0000 mg | Freq: Once | INTRAMUSCULAR | Status: AC
  Administered 2014-10-08: 120 mg via EPIDURAL

## 2014-10-08 NOTE — Discharge Instructions (Signed)

## 2014-10-10 ENCOUNTER — Telehealth: Payer: Self-pay | Admitting: Internal Medicine

## 2014-10-12 NOTE — Telephone Encounter (Signed)
Pt aware.

## 2014-10-12 NOTE — Telephone Encounter (Signed)
Dr. Henrene Pastor please advise regarding colon recall. Report placed on your desk for review.

## 2014-10-12 NOTE — Telephone Encounter (Signed)
I reviewed report. January 2018. I actually addressed this in my office note in 2012

## 2014-10-18 ENCOUNTER — Other Ambulatory Visit: Payer: Self-pay | Admitting: Rheumatology

## 2014-10-18 DIAGNOSIS — M545 Low back pain, unspecified: Secondary | ICD-10-CM

## 2014-10-22 ENCOUNTER — Ambulatory Visit
Admission: RE | Admit: 2014-10-22 | Discharge: 2014-10-22 | Disposition: A | Discharge status: Home / Self Care | Payer: Managed Care, Other (non HMO) | Source: Ambulatory Visit | Attending: Rheumatology | Admitting: Rheumatology

## 2014-10-22 DIAGNOSIS — M545 Low back pain, unspecified: Secondary | ICD-10-CM

## 2014-10-22 MED ORDER — IOHEXOL 180 MG/ML  SOLN
1.0000 mL | Freq: Once | INTRAMUSCULAR | Status: AC | PRN
  Administered 2014-10-22: 1 mL via EPIDURAL

## 2014-10-22 MED ORDER — METHYLPREDNISOLONE ACETATE 40 MG/ML INJ SUSP (RADIOLOG
120.0000 mg | Freq: Once | INTRAMUSCULAR | Status: AC
  Administered 2014-10-22: 120 mg via EPIDURAL

## 2014-10-22 NOTE — Discharge Instructions (Signed)

## 2015-01-11 ENCOUNTER — Ambulatory Visit (INDEPENDENT_AMBULATORY_CARE_PROVIDER_SITE_OTHER): Payer: Managed Care, Other (non HMO) | Admitting: Family Medicine

## 2015-01-11 ENCOUNTER — Encounter: Payer: Self-pay | Admitting: Family Medicine

## 2015-01-11 ENCOUNTER — Ambulatory Visit: Payer: Managed Care, Other (non HMO) | Admitting: Adult Health

## 2015-01-11 VITALS — BP 130/80 | HR 89 | Temp 98.3°F | Wt 196.0 lb

## 2015-01-11 DIAGNOSIS — J209 Acute bronchitis, unspecified: Secondary | ICD-10-CM

## 2015-01-11 NOTE — Progress Notes (Signed)
   Subjective:    Patient ID: Adam Villanueva, male    DOB: [birth year], 64 y.o.   MRN: [id number]  HPI   Patient seen for acute visit. One-week history of cough. Mostly nonproductive. He's not had any headaches or sinus pressure. Occasional chills initially but none now. He had some initial mild body aches. No wheezing. No chronic respiratory issues. Nonsmoker. Using over-the-counter cough medications with some relief.  Past Medical History  Diagnosis Date  . Arthritis   . Allergy   . Hypertension   . Hyperlipidemia   . Prostatitis   . Chronic insomnia    No past surgical history on file.  reports that he has never smoked. He does not have any smokeless tobacco history on file. His alcohol and drug histories are not on file. family history includes Arthritis in his father, maternal grandfather, maternal grandmother, mother, paternal grandfather, and paternal grandmother; Cancer in his mother; Hyperlipidemia in his mother; Hypertension in his father and mother. No Known Allergies    Review of Systems  Constitutional: Negative for fever and chills.  Respiratory: Positive for cough.        Objective:   Physical Exam  Constitutional: He appears well-developed and well-nourished. No distress.  HENT:  Mouth/Throat: Oropharynx is clear and moist.  Cerumen impactions bilaterally  Neck: Neck supple.  Cardiovascular: Normal rate and regular rhythm.   Pulmonary/Chest: Effort normal and breath sounds normal. No respiratory distress. He has no wheezes. He has no rales.  Lymphadenopathy:    He has no cervical adenopathy.          Assessment & Plan:  Acute bronchitis. Suspect viral. Treat symptomatically with over-the-counter medications. Follow-up as needed

## 2015-01-11 NOTE — Patient Instructions (Signed)

## 2015-01-11 NOTE — Progress Notes (Signed)
Pre visit review using our clinic review tool, if applicable. No additional management support is needed unless otherwise documented below in the visit note. 

## 2015-01-23 ENCOUNTER — Encounter: Payer: Self-pay | Admitting: Adult Health

## 2015-01-23 ENCOUNTER — Telehealth: Payer: Self-pay | Admitting: Family Medicine

## 2015-01-23 ENCOUNTER — Ambulatory Visit (INDEPENDENT_AMBULATORY_CARE_PROVIDER_SITE_OTHER): Payer: Managed Care, Other (non HMO) | Admitting: Adult Health

## 2015-01-23 VITALS — BP 110/70 | Temp 98.2°F | Wt 196.3 lb

## 2015-01-23 DIAGNOSIS — W57XXXA Bitten or stung by nonvenomous insect and other nonvenomous arthropods, initial encounter: Secondary | ICD-10-CM

## 2015-01-23 DIAGNOSIS — H6123 Impacted cerumen, bilateral: Secondary | ICD-10-CM | POA: Diagnosis not present

## 2015-01-23 DIAGNOSIS — R05 Cough: Secondary | ICD-10-CM | POA: Diagnosis not present

## 2015-01-23 DIAGNOSIS — T148 Other injury of unspecified body region: Secondary | ICD-10-CM

## 2015-01-23 DIAGNOSIS — R059 Cough, unspecified: Secondary | ICD-10-CM

## 2015-01-23 MED ORDER — DOXYCYCLINE HYCLATE 100 MG PO CAPS: 100.0000 mg | ORAL_CAPSULE | Freq: Two times a day (BID) | ORAL | Status: DC

## 2015-01-23 NOTE — Progress Notes (Signed)
   Subjective:    Patient ID: Adam Villanueva, male    DOB: [birth year], 64 y.o.   MRN: [id number]  HPI  Was bit on upper right thigh by tick on Monday. He removed the brown tick. Today he presents with red non bulls eye appear rash ( 5x1.37m). Area of upper thigh is red warm and discolored. He also has some bruising above red area.   Continues to have non productive cough that he was seen for on 01/11/2015. He does endorse that his cough is improving with OTC cough medication. Denies fever, but does endorse right ear pain with tinnitus and frontal and temporal sinus pressure,    Review of Systems  Constitutional: Negative for fever, chills, diaphoresis, activity change, appetite change, fatigue and unexpected weight change.  HENT: Positive for congestion, ear pain, sinus pressure and tinnitus. Negative for ear discharge, postnasal drip, rhinorrhea and sore throat.   Eyes: Negative.   Respiratory: Positive for cough. Negative for shortness of breath and wheezing.   Musculoskeletal: Negative for myalgias, back pain, joint swelling, arthralgias, gait problem, neck pain and neck stiffness.  Skin: Positive for color change and rash. Negative for pallor and wound.  All other systems reviewed and are negative.      Objective:   Physical Exam  Constitutional: He is oriented to person, place, and time. He appears well-developed and well-nourished. No distress.  HENT:  Head: Normocephalic and atraumatic.  Right Ear: External ear normal.  Left Ear: External ear normal.  Nose: Nose normal.  Mouth/Throat: Oropharynx is clear and moist. No oropharyngeal exudate.  Cerumen impaction of bilateral ears  Eyes: Pupils are equal, round, and reactive to light. Right eye exhibits no discharge. Left eye exhibits no discharge.  Cardiovascular: Normal rate, regular rhythm, normal heart sounds and intact distal pulses.  Exam reveals no gallop and no friction rub.   No murmur heard. Pulmonary/Chest: Effort normal  and breath sounds normal. No respiratory distress. He has no wheezes. He has no rales. He exhibits no tenderness.  Lymphadenopathy:    He has no cervical adenopathy.  Neurological: He is alert and oriented to person, place, and time. No cranial nerve deficit. Coordination normal.  Skin: Skin is warm and dry. Rash noted. There is erythema.  To right upper thigh  Psychiatric: He has a normal mood and affect. His behavior is normal. Judgment and thought content normal.  Nursing note and vitals reviewed.      Assessment & Plan:  1. Tick bite - doxycycline (VIBRAMYCIN) 100 MG capsule; Take 1 capsule (100 mg total) by mouth 2 (two) times daily.  Dispense: 20 capsule; Refill: 0 - Erythremic area outlined with skin marker. Patient was advised to follow up if area was spreading - Patient material on Lyme Disease signs and symptoms given  - Follow up as needed   2. Cerumen impaction, bilateral - Ears irrigated and a large amount of cerumen was dislodged. No redness or swelling to ear canal. Mild effusion noted to bilateral ear drums.  - Advised to take Sudafed or Claritin D for fluid behind ear.  - Use OTC ear drops to help keep ear wax soft.  - Follow up as needed.   3. Cough - He does not want any prescription cough medication at this time. He will continue to use OTC cough medicine - Follow up if no improvement in 2-3 days  - Follow up sooner if symptoms worsen or a fever of 101 is present.

## 2015-01-23 NOTE — Telephone Encounter (Signed)
Pt has an appointment with Tommi Rumps at 3:00 pm.

## 2015-01-23 NOTE — Telephone Encounter (Signed)
[facility] - Client [place] [facility]  Patient Name: Adam Villanueva  DOB: 07-20-1951    Initial Comment Caller states had a small tick bite on leg this week, this am it looks discolored and swollen    Nurse Assessment  Nurse: Mechele Dawley, RN, Amy Date/Time (Eastern Time): 01/23/2015 11:01:24 AM  Confirm and document reason for call. If symptomatic, describe symptoms. ---CALLER STATES THAT HE HAS A TICK BITE ON HIS LEG ON MONDAY. HE REMOVED THE TICK HIMSELF. HE STATES IT WAS A BROWN TICK. IT IS NOW BECOMING SWOLLEN AND DISCOLORED AROUND THE AREA OF BITE. IT IS RED AND ALMOST LOOKS LIKE A BRUISE. THE DISCOLORATION IS ABOUT AN INCH OR SO IN SIZE. WARM TO TOUCH. HES HAD A COUGH FOR 3-4 WEEKS. HAS NOT NOTICED A FEVER. RIGHT UPPER LEG AT THE THIGH AREA.  Has the patient traveled out of the country within the last 30 days? ---Not Applicable  Does the patient require triage? ---Yes  Related visit to physician within the last 2 weeks? ---Yes  Does the PT have any chronic conditions? (i.e. diabetes, asthma, etc.) ---No     Guidelines    Guideline Title Affirmed Question Affirmed Notes  Tick Bite [1] Fever AND [2] area is very tender to touch    Final Disposition User   See Physician within 4 Hours (or PCP triage) [place], RN, Amy    Comments  caller is aware of his appt time this afternoon.

## 2015-01-23 NOTE — Patient Instructions (Addendum)
Take the Doxycyline twice a day for 10 days. Follow up is you notice the rash is spreading or if you develop a rash. I have incorporated some material on signs and symptoms of Lyme disease. If you notice you have any of these please let us know.   Use Sudafed or Zyrtec D for the fluid behind your ears.   Let me know if you need anything.   Cerumen Impaction A cerumen impaction is when the wax in your ear forms a plug. This plug usually causes reduced hearing. Sometimes it also causes an earache or dizziness. Removing a cerumen impaction can be difficult and painful. The wax sticks to the ear canal. The canal is sensitive and bleeds easily. If you try to remove a heavy wax buildup with a cotton tipped swab, you may push it in further. Irrigation with water, suction, and small ear curettes may be used to clear out the wax. If the impaction is fixed to the skin in the ear canal, ear drops may be needed for a few days to loosen the wax. People who build up a lot of wax frequently can use ear wax removal products available in your local drugstore. SEEK MEDICAL CARE IF:  You develop an earache, increased hearing loss, or marked dizziness. Document Released: 10/22/2004 Document Revised: 12/07/2011 Document Reviewed: 12/12/2009 [facility] Patient Information 2015 [place], [place]. This information is not intended to replace advice given to you by your health care provider. Make sure you discuss any questions you have with your health care provider. Tick Bite Information Ticks are insects that attach themselves to the skin and draw blood for food. There are various types of ticks. Common types include wood ticks and deer ticks. Most ticks live in shrubs and grassy areas. Ticks can climb onto your body when you make contact with leaves or grass where the tick is waiting. The most common places on the body for ticks to attach themselves are the scalp, neck, armpits, waist, and groin. Most tick bites are harmless,  but sometimes ticks carry germs that cause diseases. These germs can be spread to a person during the tick's feeding process. The chance of a disease spreading through a tick bite depends on:   The type of tick.  Time of year.   How long the tick is attached.   Geographic location.  HOW CAN YOU PREVENT TICK BITES? Take these steps to help prevent tick bites when you are outdoors:  Wear protective clothing. Long sleeves and long pants are best.   Wear white clothes so you can see ticks more easily.  Tuck your pant legs into your socks.   If walking on a trail, stay in the middle of the trail to avoid brushing against bushes.  Avoid walking through areas with long grass.  Put insect repellent on all exposed skin and along boot tops, pant legs, and sleeve cuffs.   Check clothing, hair, and skin repeatedly and before going inside.   Brush off any ticks that are not attached.  Take a shower or bath as soon as possible after being outdoors.  WHAT IS THE PROPER WAY TO REMOVE A TICK? Ticks should be removed as soon as possible to help prevent diseases caused by tick bites. 1. If latex gloves are available, put them on before trying to remove a tick.  2. Using fine-point tweezers, grasp the tick as close to the skin as possible. You may also use curved forceps or a tick removal tool. Grasp  the tick as close to its head as possible. Avoid grasping the tick on its body. 3. Pull gently with steady upward pressure until the tick lets go. Do not twist the tick or jerk it suddenly. This may break off the tick's head or mouth parts. 4. Do not squeeze or crush the tick's body. This could force disease-carrying fluids from the tick into your body.  5. After the tick is removed, wash the bite area and your hands with soap and water or other disinfectant such as alcohol. 6. Apply a small amount of antiseptic cream or ointment to the bite site.  7. Wash and disinfect any instruments that  were used.  Do not try to remove a tick by applying a hot match, petroleum jelly, or fingernail polish to the tick. These methods do not work and may increase the chances of disease being spread from the tick bite.  WHEN SHOULD YOU SEEK MEDICAL CARE? Contact your health care provider if you are unable to remove a tick from your skin or if a part of the tick breaks off and is stuck in the skin.  After a tick bite, you need to be aware of signs and symptoms that could be related to diseases spread by ticks. Contact your health care provider if you develop any of the following in the days or weeks after the tick bite:  Unexplained fever.  Rash. A circular rash that appears days or weeks after the tick bite may indicate the possibility of Lyme disease. The rash may resemble a target with a bull's-eye and may occur at a different part of your body than the tick bite.  Redness and swelling in the area of the tick bite.   Tender, swollen lymph glands.   Diarrhea.   Weight loss.   Cough.   Fatigue.   Muscle, joint, or bone pain.   Abdominal pain.   Headache.   Lethargy or a change in your level of consciousness.  Difficulty walking or moving your legs.   Numbness in the legs.   Paralysis.  Shortness of breath.   Confusion.   Repeated vomiting.  Document Released: 09/11/2000 Document Revised: 07/05/2013 Document Reviewed: 02/22/2013 [facility] Patient Information 2015 [place], [place]. This information is not intended to replace advice given to you by your health care provider. Make sure you discuss any questions you have with your health care provider.

## 2015-01-23 NOTE — Telephone Encounter (Signed)
Noted  

## 2015-01-23 NOTE — Progress Notes (Signed)
Pre visit review using our clinic review tool, if applicable. No additional management support is needed unless otherwise documented below in the visit note. 

## 2015-02-04 ENCOUNTER — Other Ambulatory Visit: Payer: Self-pay | Admitting: Rheumatology

## 2015-02-04 DIAGNOSIS — M5416 Radiculopathy, lumbar region: Secondary | ICD-10-CM

## 2015-02-04 DIAGNOSIS — M5126 Other intervertebral disc displacement, lumbar region: Secondary | ICD-10-CM

## 2015-02-26 ENCOUNTER — Other Ambulatory Visit (INDEPENDENT_AMBULATORY_CARE_PROVIDER_SITE_OTHER): Payer: Managed Care, Other (non HMO)

## 2015-02-26 DIAGNOSIS — Z0000 Encounter for general adult medical examination without abnormal findings: Secondary | ICD-10-CM

## 2015-02-26 DIAGNOSIS — R7989 Other specified abnormal findings of blood chemistry: Secondary | ICD-10-CM

## 2015-02-26 DIAGNOSIS — E785 Hyperlipidemia, unspecified: Secondary | ICD-10-CM

## 2015-02-26 LAB — LDL CHOLESTEROL, DIRECT: Direct LDL: 41 mg/dL

## 2015-02-26 LAB — LIPID PANEL
Cholesterol: 176 mg/dL (ref 0–200)
HDL: 26.2 mg/dL — ABNORMAL LOW (ref >39.00)
Total CHOL/HDL Ratio: 7
Triglycerides: 879 mg/dL — ABNORMAL HIGH (ref 0.0–149.0)

## 2015-02-26 LAB — COMPREHENSIVE METABOLIC PANEL
ALK PHOS: 58 U/L (ref 39–117)
ALT: 26 U/L (ref 0–53)
AST: 24 U/L (ref 0–37)
Albumin: 4.2 g/dL (ref 3.5–5.2)
BUN: 11 mg/dL (ref 6–23)
CALCIUM: 8.8 mg/dL (ref 8.4–10.5)
CHLORIDE: 99 meq/L (ref 96–112)
CO2: 27 meq/L (ref 19–32)
Creatinine, Ser: 0.92 mg/dL (ref 0.40–1.50)
GFR: 88.63 mL/min (ref >60.00)
GLUCOSE: 100 mg/dL — AB (ref 70–99)
Potassium: 4.2 mEq/L (ref 3.5–5.1)
Sodium: 133 mEq/L — ABNORMAL LOW (ref 135–145)
TOTAL PROTEIN: 6.2 g/dL (ref 6.0–8.3)
Total Bilirubin: 0.5 mg/dL (ref 0.2–1.2)

## 2015-02-26 LAB — CBC WITH DIFFERENTIAL/PLATELET
BASOS PCT: 0.9 % (ref 0.0–3.0)
Basophils Absolute: 0 10*3/uL (ref 0.0–0.1)
EOS PCT: 7 % — AB (ref 0.0–5.0)
Eosinophils Absolute: 0.3 10*3/uL (ref 0.0–0.7)
HCT: 45.2 % (ref 39.0–52.0)
Hemoglobin: 15.9 g/dL (ref 13.0–17.0)
LYMPHS PCT: 31.8 % (ref 12.0–46.0)
Lymphs Abs: 1.2 10*3/uL (ref 0.7–4.0)
MCHC: 35.1 g/dL (ref 30.0–36.0)
MCV: 91.4 fl (ref 78.0–100.0)
MONO ABS: 0.5 10*3/uL (ref 0.1–1.0)
Monocytes Relative: 11.7 % (ref 3.0–12.0)
Neutro Abs: 1.9 10*3/uL (ref 1.4–7.7)
Neutrophils Relative %: 48.6 % (ref 43.0–77.0)
Platelets: 194 10*3/uL (ref 150.0–400.0)
RBC: 4.95 Mil/uL (ref 4.22–5.81)
RDW: 12.7 % (ref 11.5–15.5)
WBC: 3.9 10*3/uL — ABNORMAL LOW (ref 4.0–10.5)

## 2015-02-26 LAB — PSA: PSA: 2.85 ng/mL (ref 0.10–4.00)

## 2015-02-26 LAB — TSH: TSH: 1.19 u[IU]/mL (ref 0.35–4.50)

## 2015-03-04 ENCOUNTER — Encounter: Payer: Self-pay | Admitting: Family Medicine

## 2015-03-04 ENCOUNTER — Ambulatory Visit (INDEPENDENT_AMBULATORY_CARE_PROVIDER_SITE_OTHER): Payer: Managed Care, Other (non HMO) | Admitting: Family Medicine

## 2015-03-04 VITALS — BP 130/76 | HR 81 | Temp 98.5°F | Ht 69.0 in | Wt 194.0 lb

## 2015-03-04 DIAGNOSIS — E785 Hyperlipidemia, unspecified: Secondary | ICD-10-CM | POA: Diagnosis not present

## 2015-03-04 DIAGNOSIS — Z0000 Encounter for general adult medical examination without abnormal findings: Secondary | ICD-10-CM

## 2015-03-04 MED ORDER — PANTOPRAZOLE SODIUM 40 MG PO TBEC: 40.0000 mg | DELAYED_RELEASE_TABLET | Freq: Every day | ORAL | Status: DC

## 2015-03-04 MED ORDER — LISINOPRIL 40 MG PO TABS: 40.0000 mg | ORAL_TABLET | Freq: Every day | ORAL | Status: DC

## 2015-03-04 MED ORDER — SIMVASTATIN 20 MG PO TABS: 20.0000 mg | ORAL_TABLET | Freq: Every evening | ORAL | Status: DC

## 2015-03-04 MED ORDER — AMITRIPTYLINE HCL 50 MG PO TABS: 50.0000 mg | ORAL_TABLET | Freq: Every day | ORAL | Status: DC

## 2015-03-04 MED ORDER — SILDENAFIL CITRATE 100 MG PO TABS: 100.0000 mg | ORAL_TABLET | Freq: Every day | ORAL | Status: DC | PRN

## 2015-03-04 MED ORDER — ATENOLOL 50 MG PO TABS: 50.0000 mg | ORAL_TABLET | Freq: Every day | ORAL | Status: DC

## 2015-03-04 NOTE — Patient Instructions (Signed)

## 2015-03-04 NOTE — Progress Notes (Signed)
   Subjective:    Patient ID: Adam Villanueva, male    DOB: [birth year], 64 y.o.   MRN: [id number]  HPI  Patient seen for complete physical. He had colonoscopy 2008 which was normal. Tetanus up-to-date. He's had previous shingles vaccine. Chronic problems include history of dyslipidemia, hypertension, GERD , chronic insomnia. He states all his grandparents had coronary disease and his mother had congestive heart failure unknown etiology.   Nonsmoker. No recent chest pains. No consistent exercise.  Past Medical History  Diagnosis Date  . Arthritis   . Allergy   . Hypertension   . Hyperlipidemia   . Prostatitis   . Chronic insomnia    No past surgical history on file.  reports that he has never smoked. He does not have any smokeless tobacco history on file. His alcohol and drug histories are not on file. family history includes Arthritis in his father, maternal grandfather, maternal grandmother, mother, paternal grandfather, and paternal grandmother; Cancer in his mother; Hyperlipidemia in his mother; Hypertension in his father and mother. No Known Allergies    Review of Systems  Constitutional: Negative for fever, activity change, appetite change and fatigue.  HENT: Negative for congestion, ear pain and trouble swallowing.   Eyes: Negative for pain and visual disturbance.  Respiratory: Negative for cough, shortness of breath and wheezing.   Cardiovascular: Negative for chest pain and palpitations.  Gastrointestinal: Negative for nausea, vomiting, abdominal pain, diarrhea, constipation, blood in stool, abdominal distention and rectal pain.  Genitourinary: Negative for dysuria, hematuria and testicular pain.  Musculoskeletal: Negative for joint swelling and arthralgias.  Skin: Negative for rash.  Neurological: Negative for dizziness, syncope and headaches.  Hematological: Negative for adenopathy.  Psychiatric/Behavioral: Negative for confusion and dysphoric mood.       Objective:   Physical Exam  Constitutional: He is oriented to person, place, and time. He appears well-developed and well-nourished. No distress.  HENT:  Head: Normocephalic and atraumatic.  Right Ear: External ear normal.  Left Ear: External ear normal.  Mouth/Throat: Oropharynx is clear and moist.  Eyes: Conjunctivae and EOM are normal. Pupils are equal, round, and reactive to light.  Neck: Normal range of motion. Neck supple. No thyromegaly present.  Cardiovascular: Normal rate, regular rhythm and normal heart sounds.   No murmur heard. Pulmonary/Chest: No respiratory distress. He has no wheezes. He has no rales.  Abdominal: Soft. Bowel sounds are normal. He exhibits no distension and no mass. There is no tenderness. There is no rebound and no guarding.  Musculoskeletal: He exhibits no edema.  Lymphadenopathy:    He has no cervical adenopathy.  Neurological: He is alert and oriented to person, place, and time. He displays normal reflexes. No cranial nerve deficit.  Skin: No rash noted.  Psychiatric: He has a normal mood and affect.          Assessment & Plan:   Complete physical. Labs reviewed. He has significant elevated triglycerides. We discussed possible fibric acid derivative but at this point he would like to try dietary modification first and repeat lipids in about 4 months. Obtain baseline EKG. Lose some weight. He has borderline prediabetes. Repeat colonoscopy in 2 years  EKG shows sinus rhythm with no acute changes.

## 2015-03-04 NOTE — Progress Notes (Signed)
Pre visit review using our clinic review tool, if applicable. No additional management support is needed unless otherwise documented below in the visit note. 

## 2015-04-12 ENCOUNTER — Encounter: Payer: Self-pay | Admitting: Family Medicine

## 2015-04-12 ENCOUNTER — Ambulatory Visit (INDEPENDENT_AMBULATORY_CARE_PROVIDER_SITE_OTHER): Payer: Managed Care, Other (non HMO) | Admitting: Family Medicine

## 2015-04-12 VITALS — BP 102/70 | HR 75 | Temp 98.0°F | Ht 69.0 in | Wt 185.9 lb

## 2015-04-12 DIAGNOSIS — R109 Unspecified abdominal pain: Secondary | ICD-10-CM | POA: Diagnosis not present

## 2015-04-12 LAB — POCT URINALYSIS DIPSTICK
BILIRUBIN UA: NEGATIVE
Glucose, UA: NEGATIVE
KETONES UA: NEGATIVE
Leukocytes, UA: NEGATIVE
Nitrite, UA: NEGATIVE
PH UA: 7.5
PROTEIN UA: NEGATIVE
RBC UA: NEGATIVE
SPEC GRAV UA: 1.01
UROBILINOGEN UA: 0.2

## 2015-04-12 MED ORDER — METHOCARBAMOL 500 MG PO TABS: 500.0000 mg | ORAL_TABLET | Freq: Four times a day (QID) | ORAL | Status: DC | PRN

## 2015-04-12 NOTE — Patient Instructions (Signed)
Try some heat or ice for symptom relief Watch out for any new symptoms such as rash. Continue with the Aleve twice daily.

## 2015-04-12 NOTE — Progress Notes (Signed)
Pre visit review using our clinic review tool, if applicable. No additional management support is needed unless otherwise documented below in the visit note. 

## 2015-04-12 NOTE — Progress Notes (Signed)
   Subjective:    Patient ID: Adam Villanueva, male    DOB: 1951/08/17, 64 y.o.   MRN: [id number]  HPI Acute visit for right flank pain. Onset yesterday. Denies any injury. No urinary symptoms. No fever. Pain is worse with movement. He took some Aleve with slight relief. He describes a dull pain 6 out of 10 severity. No radiation but has noted occasional right groin pain. No skin rash or any other associated symptoms. Some pain to touch. Did not try any heat or ice. No history of kidney stones  Past Medical History  Diagnosis Date  . Arthritis   . Allergy   . Hypertension   . Hyperlipidemia   . Prostatitis   . Chronic insomnia    No past surgical history on file.  reports that he has never smoked. He does not have any smokeless tobacco history on file. His alcohol and drug histories are not on file. family history includes Arthritis in his father, maternal grandfather, maternal grandmother, mother, paternal grandfather, and paternal grandmother; Cancer in his mother; Hyperlipidemia in his mother; Hypertension in his father and mother. No Known Allergies    Review of Systems  Constitutional: Negative for fever, chills, appetite change and unexpected weight change.  Respiratory: Negative for shortness of breath.   Cardiovascular: Negative for chest pain.  Gastrointestinal: Negative for abdominal pain.  Genitourinary: Negative for dysuria.  Musculoskeletal: Positive for back pain.  Skin: Negative for rash.       Objective:   Physical Exam  Constitutional: He appears well-developed and well-nourished.  Cardiovascular: Normal rate and regular rhythm.   Pulmonary/Chest: Effort normal and breath sounds normal. No respiratory distress. He has no wheezes. He has no rales.  Abdominal: Soft. Bowel sounds are normal. He exhibits no distension and no mass. There is no tenderness. There is no rebound and no guarding.  Musculoskeletal:  Straight leg raise are negative bilaterally    Neurological:  Full-strength lower extremities with symmetric reflexes  Skin: No rash noted.          Assessment & Plan:  Right flank pain. Suspect musculoskeletal given the fact this is sore to touch and worse with movement. Check urinalysis.  UA is normal.  Continue with Aleve and he will try heat and/or ice.  Robaxin 500 mg po q 8 hours prn.

## 2015-05-14 ENCOUNTER — Other Ambulatory Visit: Payer: Self-pay | Admitting: *Deleted

## 2015-05-14 MED ORDER — AMITRIPTYLINE HCL 50 MG PO TABS: 50.0000 mg | ORAL_TABLET | Freq: Every day | ORAL | Status: DC

## 2015-05-14 MED ORDER — SILDENAFIL CITRATE 100 MG PO TABS: 100.0000 mg | ORAL_TABLET | Freq: Every day | ORAL | Status: DC | PRN

## 2015-05-14 MED ORDER — LISINOPRIL 40 MG PO TABS: 40.0000 mg | ORAL_TABLET | Freq: Every day | ORAL | Status: DC

## 2015-05-14 MED ORDER — SIMVASTATIN 20 MG PO TABS: 20.0000 mg | ORAL_TABLET | Freq: Every evening | ORAL | Status: DC

## 2015-05-14 MED ORDER — PANTOPRAZOLE SODIUM 40 MG PO TBEC: 40.0000 mg | DELAYED_RELEASE_TABLET | Freq: Every day | ORAL | Status: DC

## 2015-05-20 ENCOUNTER — Other Ambulatory Visit: Payer: Self-pay | Admitting: *Deleted

## 2015-05-20 MED ORDER — ATENOLOL 50 MG PO TABS: 50.0000 mg | ORAL_TABLET | Freq: Every day | ORAL | Status: DC

## 2015-05-24 ENCOUNTER — Ambulatory Visit (INDEPENDENT_AMBULATORY_CARE_PROVIDER_SITE_OTHER): Payer: BLUE CROSS/BLUE SHIELD | Admitting: Family Medicine

## 2015-05-24 ENCOUNTER — Ambulatory Visit (INDEPENDENT_AMBULATORY_CARE_PROVIDER_SITE_OTHER): Payer: BLUE CROSS/BLUE SHIELD

## 2015-05-24 VITALS — BP 130/76 | HR 80 | Temp 97.9°F | Resp 18 | Ht 70.5 in | Wt 186.0 lb

## 2015-05-24 DIAGNOSIS — M25572 Pain in left ankle and joints of left foot: Secondary | ICD-10-CM | POA: Diagnosis not present

## 2015-05-24 DIAGNOSIS — M25475 Effusion, left foot: Secondary | ICD-10-CM

## 2015-05-24 LAB — POCT CBC
Granulocyte percent: 75.5 %G (ref 37–80)
HCT, POC: 50.7 % (ref 43.5–53.7)
Hemoglobin: 16 g/dL (ref 14.1–18.1)
Lymph, poc: 1.1 (ref 0.6–3.4)
MCH, POC: 28.3 pg (ref 27–31.2)
MCHC: 31.5 g/dL — AB (ref 31.8–35.4)
MCV: 89.9 fL (ref 80–97)
MID (cbc): 0.4 (ref 0–0.9)
MPV: 7.2 fL (ref 0–99.8)
POC Granulocyte: 4.8 (ref 2–6.9)
POC LYMPH PERCENT: 17.6 %L (ref 10–50)
POC MID %: 6.9 %M (ref 0–12)
Platelet Count, POC: 189 10*3/uL (ref 142–424)
RBC: 5.64 M/uL (ref 4.69–6.13)
RDW, POC: 13.5 %
WBC: 6.4 10*3/uL (ref 4.6–10.2)

## 2015-05-24 LAB — URIC ACID: Uric Acid, Serum: 4.8 mg/dL (ref 4.0–7.8)

## 2015-05-24 MED ORDER — INDOMETHACIN 50 MG PO CAPS: 50.0000 mg | ORAL_CAPSULE | Freq: Two times a day (BID) | ORAL | Status: DC

## 2015-05-24 NOTE — Progress Notes (Signed)
Chief Complaint:  Chief Complaint  Patient presents with  . Foot Pain    left x1 week   . Foot Swelling    noticed yesterday     HPI: Adam Villanueva is a 64 y.o. male who reports to [facility] today complaining of  Right foot pain, Saturday, and yesterday it was sore to the touch and warm on the top of his foot. He denies any fevers or chill. He deneis any injuries. No prior injuries. 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 He was with [employer]. No hx of gout. Has a hx of OA, sever n back, he takes aleve for bck paina nd sees Dr Charlestine Night for his back. Low/no risk factors of PE/DVT. Denies recent trauma/surgeires, hx of DVT/PE, long car or plane rides, sedentary lifestyle, hormone use, or malignancy   Past Medical History  Diagnosis Date  . Arthritis   . Allergy   . Hypertension   . Hyperlipidemia   . Prostatitis   . Chronic insomnia   . Anxiety   . Cataract    History reviewed. No pertinent past surgical history. Social History   Social History  . Marital Status: Divorced    Spouse Name: N/A  . Number of Children: N/A  . Years of Education: N/A   Social History Main Topics  . Smoking status: Never Smoker   . Smokeless tobacco: None  . Alcohol Use: None  . Drug Use: None  . Sexual Activity: Not Asked   Other Topics Concern  . None   Social History Narrative   Family History  Problem Relation Age of Onset  . Arthritis Mother     rheumatoid  . Hyperlipidemia Mother   . Hypertension Mother   . Cancer Mother     rectal cancer  . Arthritis Father   . Hypertension Father   . Arthritis Maternal Grandmother   . Arthritis Maternal Grandfather   . Arthritis Paternal Grandmother   . Arthritis Paternal Grandfather    No Known Allergies Prior to Admission medications   Medication Sig Start Date End Date Taking? Authorizing Provider  amitriptyline (ELAVIL) 50 MG tablet Take 1 tablet  (50 mg total) by mouth at bedtime. 05/14/15  Yes Dorena Cookey, MD  atenolol (TENORMIN) 50 MG tablet Take 1 tablet (50 mg total) by mouth daily. 05/20/15  Yes Eulas Post, MD  ibuprofen (ADVIL,MOTRIN) 200 MG tablet Take 400 mg by mouth every 6 (six) hours as needed (pain).   Yes Historical Provider, MD  lisinopril (PRINIVIL,ZESTRIL) 40 MG tablet Take 1 tablet (40 mg total) by mouth daily. 05/14/15  Yes Dorena Cookey, MD  Multiple Vitamin (MULTIVITAMIN) tablet Take 1 tablet by mouth daily.   Yes Historical Provider, MD  Omega-3 Fatty Acids (OMEGA-3 FISH OIL PO) Take by mouth.   Yes Historical Provider, MD  pantoprazole (PROTONIX) 40 MG tablet Take 1 tablet (40 mg total) by mouth daily. 05/14/15  Yes Dorena Cookey, MD  sildenafil (VIAGRA) 100 MG tablet Take 1 tablet (100 mg total) by mouth daily as needed (erectile dysfuncation). 05/14/15  Yes Dorena Cookey, MD  simvastatin (ZOCOR) 20 MG tablet Take 1 tablet (20 mg total) by mouth every evening. 05/14/15  Yes Eulas Post, MD  tamsulosin (FLOMAX) 0.4 MG CAPS capsule Take 0.4 mg by mouth.   Yes Historical Provider, MD  tobramycin-dexamethasone (  TOBRADEX) ophthalmic solution Place 1 drop into both eyes as needed.  02/15/15  Yes Historical Provider, MD  vitamin C (ASCORBIC ACID) 500 MG tablet Take 500 mg by mouth daily.   Yes Historical Provider, MD  methocarbamol (ROBAXIN) 500 MG tablet Take 1 tablet (500 mg total) by mouth every 6 (six) hours as needed for muscle spasms. Patient not taking: Reported on 05/24/2015 04/12/15   Eulas Post, MD     ROS: The patient denies fevers, chills, night sweats, unintentional weight loss, chest pain, palpitations, wheezing, dyspnea on exertion, nausea, vomiting, abdominal pain, dysuria, hematuria, melena, numbness, weakness, or tingling.   All other systems have been reviewed and were otherwise negative with the exception of those mentioned in the HPI and as above.    PHYSICAL EXAM: Filed Vitals:    05/24/15 1344  BP: 130/76  Pulse: 80  Temp: 97.9 F (36.6 C)  Resp: 18   Body mass index is 26.3 kg/(m^2).   General: Alert, no acute distress HEENT:  Normocephalic, atraumatic, oropharynx patent. EOMI, PERRLA Cardiovascular:  Regular rate and rhythm, no rubs murmurs or gallops.   Respiratory: Clear to auscultation bilaterally.  No wheezes, rales, or rhonchi.  No cyanosis, no use of accessory musculature Abdominal: No organomegaly, abdomen is soft and non-tender, positive bowel sounds. No masses. Skin: No rashes. Neurologic: Facial musculature symmetric. Psychiatric: Patient acts appropriately throughout our interaction. Lymphatic: No cervical or submandibular lymphadenopathy Musculoskeletal: Gait intact.  Right upper foot pain, + erythema, + swelling, minimal wamrth Full ROm, + DP ,  senation intact  LABS: Results for orders placed or performed in visit on 05/24/15  POCT CBC  Result Value Ref Range   WBC 6.4 4.6 - 10.2 K/uL   Lymph, poc 1.1 0.6 - 3.4   POC LYMPH PERCENT 17.6 10 - 50 %L   MID (cbc) 0.4 0 - 0.9   POC MID % 6.9 0 - 12 %M   POC Granulocyte 4.8 2 - 6.9   Granulocyte percent 75.5 37 - 80 %G   RBC 5.64 4.69 - 6.13 M/uL   Hemoglobin 16.0 14.1 - 18.1 g/dL   HCT, POC 50.7 43.5 - 53.7 %   MCV 89.9 80 - 97 fL   MCH, POC 28.3 27 - 31.2 pg   MCHC 31.5 (A) 31.8 - 35.4 g/dL   RDW, POC 13.5 %   Platelet Count, POC 189 142 - 424 K/uL   MPV 7.2 0 - 99.8 fL     EKG/XRAY:   Primary read interpreted by Dr. Marin Comment at [facility]. Neg for fx or dislocation    ASSESSMENT/PLAN: Encounter Diagnoses  Name Primary?  . Pain in joint, ankle and foot, left Yes  . Swelling of foot joint, left    Rx indomethacin x next 3 days consistently , no other NSAIDs RICE Labs and xray reassuring, but precautions given Fu prn with labs  Gross sideeffects, risk and benefits, and alternatives of medications d/w patient. Patient is aware that all medications have potential sideeffects and we are  unable to predict every sideeffect or drug-drug interaction that may occur.  Adam Youtsey DO  05/24/2015 3:27 PM  8/27 followed up with uric acid, will see how he does for next 3 d ays, precaution given to him if redness or pain increases

## 2015-05-24 NOTE — Patient Instructions (Signed)
Pseudogout Pseudogout is similar to gout. It is an arthritis that causes pain, swelling, and inflammation in a joint. This is due to the presence of calcium pyrophosphate crystals in the joint fluid. This is a different type of crystal than the crystals that cause gout. The joint pain can be severe and may last for days. In some cases, it may last much longer and can mimic rheumatoid arthritis or osteoarthritis. CAUSES  The exact cause of the disease is not known. It develops when crystals of calcium pyrophosphate build up in a joint. These crystals cause inflammation that leads to pain and swelling of the joint.  Chances of developing pseudogout increase with age. It often follows a minor injury.  The condition may be passed down from parent to child (hereditary).  Events such as strokes, heart attacks, or surgery may increase the risk of pseudogout.  Pseudogout can be associated with other disorders (hemophilia, ochronosis, amyloidosis, or hormonal disorders).  Pseudogout can be associated with dehydration, especially following surgery or hospitalization. Patients with known pseudogout should stay well hydrated before and after surgery. SYMPTOMS   Intense, constant pain in one joint that seems to come on for no reason.  The joint area may be hot to the touch, red, swollen, and stiff.  Pain may last from several days to a few weeks. It may then disappear. Later, it may start again, possibly in a different joint.  Pseudogout usually affects the knees. It can also affect the wrists, elbows, shoulders, and ankles. DIAGNOSIS  The diagnosis is often suggested by your exam or is suspected when an abnormal buildup of calcium salts (calcifications) are seen in the cartilage of joints on X-rays. The final diagnosis is made when fluid from the joint is examined under a special microscope used to find calcium pyrophosphate crystals. The crystals of pseudogout and gout may both be present at the same  time. TREATMENT  Nonsteroidal anti-inflammatory drugs (NSAIDs), such as naproxen, treat the pain. Identifying the trigger of pseudogout and treating the underlying cause, such as dehydration, is also important. There is no way to remove the crystals themselves. HOME CARE INSTRUCTIONS   Put ice on the sore joint.  Put ice in a plastic bag.  Place a towel between your skin and the bag.  Leave the ice on for 15-20 minutes at a time, 03-04 times a day, for the first 2 days.  Keep your affected joints raised (elevated) when possible to lessen swelling.  Use crutches (non-weight bearing) as needed. Walk without crutches as the pain allows, or as instructed. Gradually, start bearing weight.  Only take over-the-counter or prescription medicines for pain, discomfort, or fever as directed by your caregiver.  Once you recover from the painful attack, exercise regularly to keep your muscle strength. Not using a sore joint will cause the muscles around it to become weak. This may increase pain. Low-impact exercises, such as swimming, bicycling, water aerobics, and walking, may be best. This will give you energy, strengthen your heart, help you control your weight, and improve your well-being.  Maintain a healthy weight so your joints do not need to bear more weight than necessary. SEEK MEDICAL CARE IF:   You have an increase in joint pain not relieved with medicine.  You have a fever.  You have more serious symptoms such as skin rash, diarrhea, vomiting, headache, or other joint pains. FOR MORE INFORMATION  Arthritis Foundation: www.arthritis.[facility] of Arthritis and Musculoskeletal and Skin Diseases: www.niams.[employer] Document Released: 06/06/2004  Document Revised: 12/07/2011 Document Reviewed: 12/27/2009 [facility] Patient Information 2015 [place], [place]. This information is not intended to replace advice given to you by your health care provider. Make sure you discuss any  questions you have with your health care provider. Gout Gout is an inflammatory arthritis caused by a buildup of uric acid crystals in the joints. Uric acid is a chemical that is normally present in the blood. When the level of uric acid in the blood is too high it can form crystals that deposit in your joints and tissues. This causes joint redness, soreness, and swelling (inflammation). Repeat attacks are common. Over time, uric acid crystals can form into masses (tophi) near a joint, destroying bone and causing disfigurement. Gout is treatable and often preventable. CAUSES  The disease begins with elevated levels of uric acid in the blood. Uric acid is produced by your body when it breaks down a naturally found substance called purines. Certain foods you eat, such as meats and fish, contain high amounts of purines. Causes of an elevated uric acid level include:  Being passed down from parent to child (heredity).  Diseases that cause increased uric acid production (such as obesity, psoriasis, and certain cancers).  Excessive alcohol use.  Diet, especially diets rich in meat and seafood.  Medicines, including certain cancer-fighting medicines (chemotherapy), water pills (diuretics), and aspirin.  Chronic kidney disease. The kidneys are no longer able to remove uric acid well.  Problems with metabolism. Conditions strongly associated with gout include:  Obesity.  High blood pressure.  High cholesterol.  Diabetes. Not everyone with elevated uric acid levels gets gout. It is not understood why some people get gout and others do not. Surgery, joint injury, and eating too much of certain foods are some of the factors that can lead to gout attacks. SYMPTOMS   An attack of gout comes on quickly. It causes intense pain with redness, swelling, and warmth in a joint.  Fever can occur.  Often, only one joint is involved. Certain joints are more commonly involved:  Base of the big  toe.  Knee.  Ankle.  Wrist.  Finger. Without treatment, an attack usually goes away in a few days to weeks. Between attacks, you usually will not have symptoms, which is different from many other forms of arthritis. DIAGNOSIS  Your caregiver will suspect gout based on your symptoms and exam. In some cases, tests may be recommended. The tests may include:  Blood tests.  Urine tests.  X-rays.  Joint fluid exam. This exam requires a needle to remove fluid from the joint (arthrocentesis). Using a microscope, gout is confirmed when uric acid crystals are seen in the joint fluid. TREATMENT  There are two phases to gout treatment: treating the sudden onset (acute) attack and preventing attacks (prophylaxis).  Treatment of an Acute Attack.  Medicines are used. These include anti-inflammatory medicines or steroid medicines.  An injection of steroid medicine into the affected joint is sometimes necessary.  The painful joint is rested. Movement can worsen the arthritis.  You may use warm or cold treatments on painful joints, depending which works best for you.  Treatment to Prevent Attacks.  If you suffer from frequent gout attacks, your caregiver may advise preventive medicine. These medicines are started after the acute attack subsides. These medicines either help your kidneys eliminate uric acid from your body or decrease your uric acid production. You may need to stay on these medicines for a very long time.  The early phase of  treatment with preventive medicine can be associated with an increase in acute gout attacks. For this reason, during the first few months of treatment, your caregiver may also advise you to take medicines usually used for acute gout treatment. Be sure you understand your caregiver's directions. Your caregiver may make several adjustments to your medicine dose before these medicines are effective.  Discuss dietary treatment with your caregiver or dietitian.  Alcohol and drinks high in sugar and fructose and foods such as meat, poultry, and seafood can increase uric acid levels. Your caregiver or dietitian can advise you on drinks and foods that should be limited. HOME CARE INSTRUCTIONS   Do not take aspirin to relieve pain. This raises uric acid levels.  Only take over-the-counter or prescription medicines for pain, discomfort, or fever as directed by your caregiver.  Rest the joint as much as possible. When in bed, keep sheets and blankets off painful areas.  Keep the affected joint raised (elevated).  Apply warm or cold treatments to painful joints. Use of warm or cold treatments depends on which works best for you.  Use crutches if the painful joint is in your leg.  Drink enough fluids to keep your urine clear or pale yellow. This helps your body get rid of uric acid. Limit alcohol, sugary drinks, and fructose drinks.  Follow your dietary instructions. Pay careful attention to the amount of protein you eat. Your daily diet should emphasize fruits, vegetables, whole grains, and fat-free or low-fat milk products. Discuss the use of coffee, vitamin C, and cherries with your caregiver or dietitian. These may be helpful in lowering uric acid levels.  Maintain a healthy body weight. SEEK MEDICAL CARE IF:   You develop diarrhea, vomiting, or any side effects from medicines.  You do not feel better in 24 hours, or you are getting worse. SEEK IMMEDIATE MEDICAL CARE IF:   Your joint becomes suddenly more tender, and you have chills or a fever. MAKE SURE YOU:   Understand these instructions.  Will watch your condition.  Will get help right away if you are not doing well or get worse. Document Released: 09/11/2000 Document Revised: 01/29/2014 Document Reviewed: 04/27/2012 [facility] Patient Information 2015 [place], [place]. This information is not intended to replace advice given to you by your health care provider. Make sure you discuss any  questions you have with your health care provider.

## 2015-05-27 ENCOUNTER — Encounter: Payer: Self-pay | Admitting: Family Medicine

## 2015-05-27 ENCOUNTER — Ambulatory Visit (INDEPENDENT_AMBULATORY_CARE_PROVIDER_SITE_OTHER): Payer: BLUE CROSS/BLUE SHIELD | Admitting: Family Medicine

## 2015-05-27 VITALS — BP 126/70 | HR 72 | Temp 97.4°F | Wt 184.0 lb

## 2015-05-27 DIAGNOSIS — L03116 Cellulitis of left lower limb: Secondary | ICD-10-CM

## 2015-05-27 MED ORDER — CEPHALEXIN 500 MG PO CAPS: 500.0000 mg | ORAL_CAPSULE | Freq: Three times a day (TID) | ORAL | Status: DC

## 2015-05-27 NOTE — Progress Notes (Signed)
Pre visit review using our clinic review tool, if applicable. No additional management support is needed unless otherwise documented below in the visit note. 

## 2015-05-27 NOTE — Patient Instructions (Signed)

## 2015-05-27 NOTE — Progress Notes (Signed)
   Subjective:    Patient ID: Adam Villanueva, male    DOB: 1951/08/12, 64 y.o.   MRN: [id number]  HPI Acute visit for left foot pain. History is that about 10 days ago he was doing some work and felt like something may have fallen on his foot. He was not sure of exact injury. 3 days ago he went to urgent care center and had x-rays which showed no fracture. Uric acid 4.8. He was placed on Indocin for presumed gout. Since that time, he's had progressive swelling and redness. No fevers or chills. He has no history of gout. Ambulating without difficulty.  Past Medical History  Diagnosis Date  . Arthritis   . Allergy   . Hypertension   . Hyperlipidemia   . Prostatitis   . Chronic insomnia   . Anxiety   . Cataract    No past surgical history on file.  reports that he has never smoked. He does not have any smokeless tobacco history on file. His alcohol and drug histories are not on file. family history includes Arthritis in his father, maternal grandfather, maternal grandmother, mother, paternal grandfather, and paternal grandmother; Cancer in his mother; Hyperlipidemia in his mother; Hypertension in his father and mother. No Known Allergies    Review of Systems  Constitutional: Negative for fever and chills.       Objective:   Physical Exam  Constitutional: He appears well-developed and well-nourished. No distress.  Cardiovascular: Normal rate and regular rhythm.   Pulmonary/Chest: Effort normal and breath sounds normal. No respiratory distress. He has no wheezes. He has no rales.  Skin:  Left foot anterior reveals 7 x 5 cm area of erythema and warmth. This is more on the dorsum of the foot. He does not have any erythema confined around the ankle joint or metatarsophalangeal joint. Minimally tender to palpation          Assessment & Plan:  Probable cellulitis left foot. Doubt this represents gout. Keflex 500 mg 3 times a day for 10 days. Reassess in 2 days and sooner as needed

## 2015-05-29 ENCOUNTER — Ambulatory Visit (INDEPENDENT_AMBULATORY_CARE_PROVIDER_SITE_OTHER): Payer: BLUE CROSS/BLUE SHIELD | Admitting: Family Medicine

## 2015-05-29 ENCOUNTER — Encounter: Payer: Self-pay | Admitting: Family Medicine

## 2015-05-29 VITALS — BP 130/70 | HR 71 | Temp 97.4°F | Wt 185.0 lb

## 2015-05-29 DIAGNOSIS — L03116 Cellulitis of left lower limb: Secondary | ICD-10-CM | POA: Diagnosis not present

## 2015-05-29 NOTE — Patient Instructions (Signed)
Finish out antibiotic. Continue with heating pad to foot 2-3 times daily Follow up for any fever or recurrent redness.

## 2015-05-29 NOTE — Progress Notes (Signed)
   Subjective:    Patient ID: Adam Villanueva, male    DOB: 1951/07/19, 64 y.o.   MRN: [id number]  HPI Patient seen for follow-up regarding cellulitis of the foot. We started Keflex. No fevers or chills. Swelling is tremendously improved. Still some redness and some tenderness but overall improved. Ambulating without difficulty. He is using a heating pad. No adverse side effects from antibiotics  Past Medical History  Diagnosis Date  . Arthritis   . Allergy   . Hypertension   . Hyperlipidemia   . Prostatitis   . Chronic insomnia   . Anxiety   . Cataract    No past surgical history on file.  reports that he has never smoked. He does not have any smokeless tobacco history on file. His alcohol and drug histories are not on file. family history includes Arthritis in his father, maternal grandfather, maternal grandmother, mother, paternal grandfather, and paternal grandmother; Cancer in his mother; Hyperlipidemia in his mother; Hypertension in his father and mother. No Known Allergies    Review of Systems  Constitutional: Negative for fever and chills.       Objective:   Physical Exam  Constitutional: He appears well-developed and well-nourished.  Cardiovascular: Normal rate and regular rhythm.   Pulmonary/Chest: Effort normal and breath sounds normal. No respiratory distress. He has no wheezes. He has no rales.  Skin:  Left foot reveals streak of erythema approximately 5 x 2 cm dorsum of foot. Overall this is improved. He has tremendous improvement with less edema and also less warmth. Minimally tender.          Assessment & Plan:  Cellulitis left foot. Improving. Continue Keflex 500 mg 3 times a day. Continue frequent elevation. Continue heating pad locally. Touch base if this does not continue to improve

## 2015-05-29 NOTE — Progress Notes (Signed)
Pre visit review using our clinic review tool, if applicable. No additional management support is needed unless otherwise documented below in the visit note. 

## 2015-07-04 ENCOUNTER — Other Ambulatory Visit (INDEPENDENT_AMBULATORY_CARE_PROVIDER_SITE_OTHER): Payer: BLUE CROSS/BLUE SHIELD

## 2015-07-04 ENCOUNTER — Ambulatory Visit (INDEPENDENT_AMBULATORY_CARE_PROVIDER_SITE_OTHER): Payer: BLUE CROSS/BLUE SHIELD | Admitting: Family Medicine

## 2015-07-04 DIAGNOSIS — E785 Hyperlipidemia, unspecified: Secondary | ICD-10-CM | POA: Diagnosis not present

## 2015-07-04 DIAGNOSIS — Z23 Encounter for immunization: Secondary | ICD-10-CM

## 2015-07-04 LAB — LIPID PANEL
CHOLESTEROL: 195 mg/dL (ref 0–200)
HDL: 30.3 mg/dL — ABNORMAL LOW (ref >39.00)
Total CHOL/HDL Ratio: 6
Triglycerides: 407 mg/dL — ABNORMAL HIGH (ref 0.0–149.0)

## 2015-07-04 LAB — LDL CHOLESTEROL, DIRECT: Direct LDL: 76 mg/dL

## 2015-07-15 ENCOUNTER — Ambulatory Visit (INDEPENDENT_AMBULATORY_CARE_PROVIDER_SITE_OTHER): Payer: BLUE CROSS/BLUE SHIELD | Admitting: Family Medicine

## 2015-07-15 ENCOUNTER — Encounter: Payer: Self-pay | Admitting: Family Medicine

## 2015-07-15 VITALS — BP 120/79 | HR 78 | Temp 98.1°F | Ht 70.5 in | Wt 186.0 lb

## 2015-07-15 DIAGNOSIS — J209 Acute bronchitis, unspecified: Secondary | ICD-10-CM | POA: Diagnosis not present

## 2015-07-15 MED ORDER — HYDROCODONE-HOMATROPINE 5-1.5 MG/5ML PO SYRP: 5.0000 mL | ORAL_SOLUTION | ORAL | Status: DC | PRN

## 2015-07-15 MED ORDER — AZITHROMYCIN 250 MG PO TABS: ORAL_TABLET | ORAL | Status: DC

## 2015-07-15 NOTE — Progress Notes (Signed)
Pre visit review using our clinic review tool, if applicable. No additional management support is needed unless otherwise documented below in the visit note. 

## 2015-07-15 NOTE — Progress Notes (Signed)
   Subjective:    Patient ID: Adam Villanueva, male    DOB: 1950-10-17, 64 y.o.   MRN: [id number]  HPI Here for 10 days of stuffy head, left ear pain, ST, chest congestion and coughing up yellow sputum. Drinking fluids and taking Aleve.    Review of Systems  Constitutional: Negative.   HENT: Positive for congestion, ear pain, postnasal drip, sore throat and voice change.   Eyes: Negative.   Respiratory: Positive for cough and chest tightness. Negative for wheezing.   Cardiovascular: Negative.        Objective:   Physical Exam  Constitutional: He appears well-developed and well-nourished.  HENT:  Right Ear: External ear normal.  Left Ear: External ear normal.  Nose: Nose normal.  Eyes: Conjunctivae are normal.  Neck: No thyromegaly present.  Shotty tender AC nodes   Cardiovascular: Normal rate, regular rhythm, normal heart sounds and intact distal pulses.   Pulmonary/Chest: Effort normal. No respiratory distress. He has no wheezes. He has no rales.  Scattered rhonchi           Assessment & Plan:  Bronchitis, treat with a Zpack

## 2016-01-22 ENCOUNTER — Ambulatory Visit (INDEPENDENT_AMBULATORY_CARE_PROVIDER_SITE_OTHER): Payer: BLUE CROSS/BLUE SHIELD | Admitting: Family Medicine

## 2016-01-22 VITALS — BP 120/90 | HR 73 | Temp 97.4°F | Ht 70.5 in | Wt 184.0 lb

## 2016-01-22 DIAGNOSIS — E785 Hyperlipidemia, unspecified: Secondary | ICD-10-CM | POA: Diagnosis not present

## 2016-01-22 DIAGNOSIS — R208 Other disturbances of skin sensation: Secondary | ICD-10-CM | POA: Diagnosis not present

## 2016-01-22 LAB — TSH: TSH: 0.94 u[IU]/mL (ref 0.35–4.50)

## 2016-01-22 LAB — VITAMIN B12: Vitamin B-12: 833 pg/mL (ref 211–911)

## 2016-01-22 LAB — SEDIMENTATION RATE: SED RATE: 5 mm/h (ref 0–22)

## 2016-01-22 LAB — HEMOGLOBIN A1C: HEMOGLOBIN A1C: 5.6 % (ref 4.6–6.5)

## 2016-01-22 MED ORDER — ROSUVASTATIN CALCIUM 10 MG PO TABS: 10.0000 mg | ORAL_TABLET | Freq: Every day | ORAL | Status: DC

## 2016-01-22 NOTE — Patient Instructions (Signed)
Go ahead and schedule physical Stop the Simvastatin and start Crestor 10 mg daily.

## 2016-01-22 NOTE — Progress Notes (Signed)
Pre visit review using our clinic review tool, if applicable. No additional management support is needed unless otherwise documented below in the visit note. 

## 2016-01-22 NOTE — Progress Notes (Signed)
   Subjective:    Patient ID: Adam Villanueva, male    DOB: [birth year], 65 y.o.   MRN: [id number]  HPI Patient seen with complaints of bilateral feet "burning " Symptoms are worse at night. Relatively mild severity 3 out of 10. No history of diabetes. No history of B12 deficiency. He has occasional hand symptoms but he complains of hand pain which he thinks is more arthritis related. Denies any leg symptoms. No heavy metal exposures.  Hyperlipidemia history. Patient took himself off simvastatin recently secondary to low back pain. Low back pain improved after stopping simvastatin. Patient does take lisinopril and so potential interaction with lisinopril and simvastatin. He would like to explore other possible statin options. Also has history of hypertriglyceridemia.  Requesting hepatitis C screening-though he has no edema and a viable risk factors.  Past Medical History  Diagnosis Date  . Arthritis   . Allergy   . Hypertension   . Hyperlipidemia   . Prostatitis   . Chronic insomnia   . Anxiety   . Cataract    No past surgical history on file.  reports that he has never smoked. He has never used smokeless tobacco. He reports that he drinks alcohol. He reports that he does not use illicit drugs. family history includes Arthritis in his father, maternal grandfather, maternal grandmother, mother, paternal grandfather, and paternal grandmother; Cancer in his mother; Hyperlipidemia in his mother; Hypertension in his father and mother. No Known Allergies    Review of Systems  Constitutional: Negative for fever, chills and fatigue.  Eyes: Negative for visual disturbance.  Respiratory: Negative for cough, chest tightness and shortness of breath.   Cardiovascular: Negative for chest pain, palpitations and leg swelling.  Gastrointestinal: Negative for abdominal pain.  Neurological: Negative for dizziness, syncope, weakness, light-headedness and headaches.       Objective:   Physical  Exam  Constitutional: He is oriented to person, place, and time. He appears well-developed and well-nourished. No distress.  Neck: Neck supple. No thyromegaly present.  Cardiovascular: Normal rate and regular rhythm.   Pulmonary/Chest: Effort normal and breath sounds normal. No respiratory distress. He has no wheezes. He has no rales.  Musculoskeletal: He exhibits no edema.  Neurological: He is alert and oriented to person, place, and time. No cranial nerve deficit.  Full-strength lower extremities. Normal sensory function with monofilament testing.  Skin: No rash noted.          Assessment & Plan:  #1 bilateral foot symptoms of burning. 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 No history of diabetes or other clear provoking factors. Will check hemoglobin A1c, sedimentation rate, TSH, B12, serum protein electrophoresis. Patient already takes Elavil and as symptoms are mild would not add further medication this point unless symptoms progress. Does not have any risk factors for heavy metal exposure.  #2 dyslipidemia. Possible myalgias related to simvastatin. Discontinue simvastatin. Start Crestor 10 mg once daily. Schedule physical in the next couple months and plan follow-up lipid panel then.  Eulas Post MD [facility] at [facility]

## 2016-01-23 LAB — HEPATITIS C ANTIBODY: HCV Ab: NEGATIVE

## 2016-01-24 LAB — PROTEIN ELECTROPHORESIS, SERUM
ALPHA-1-GLOBULIN: 0.2 g/dL (ref 0.2–0.3)
ALPHA-2-GLOBULIN: 0.7 g/dL (ref 0.5–0.9)
Albumin ELP: 4.2 g/dL (ref 3.8–4.8)
Beta 2: 0.3 g/dL (ref 0.2–0.5)
Beta Globulin: 0.4 g/dL (ref 0.4–0.6)
Gamma Globulin: 0.8 g/dL (ref 0.8–1.7)
TOTAL PROTEIN, SERUM ELECTROPHOR: 6.6 g/dL (ref 6.1–8.1)

## 2016-02-10 IMAGING — CT CT ABD-PELV W/O CM
1 series · 15 of 24 positions shown, 19 images · non-contrast
Comparison: None.

CLINICAL DATA: R flank pain, RLQ pain

EXAM:
CT ABDOMEN AND PELVIS WITHOUT CONTRAST
TECHNIQUE: Multidetector CT imaging of the abdomen and pelvis was performed
following the standard protocol without IV contrast.

[Series 4: lung · axial · 0.79mm/px · z∈[-138,-33]mm · 15 of 24 slices shown, 19 images]
[im 2/24  soft-tissue]
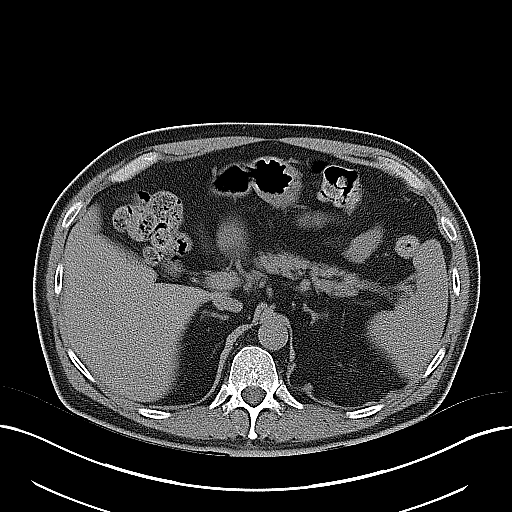
[im 2/24  bone]
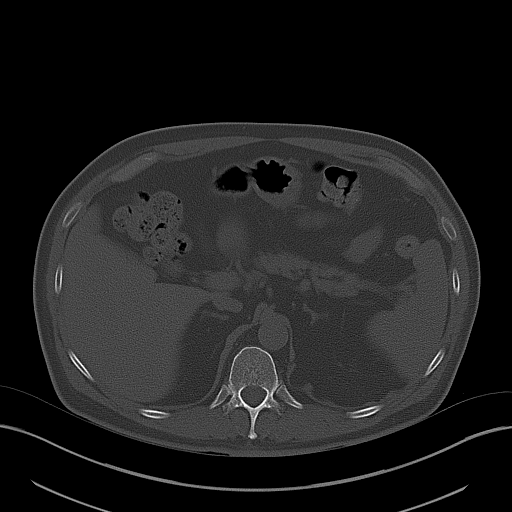
[im 4/24  soft-tissue]
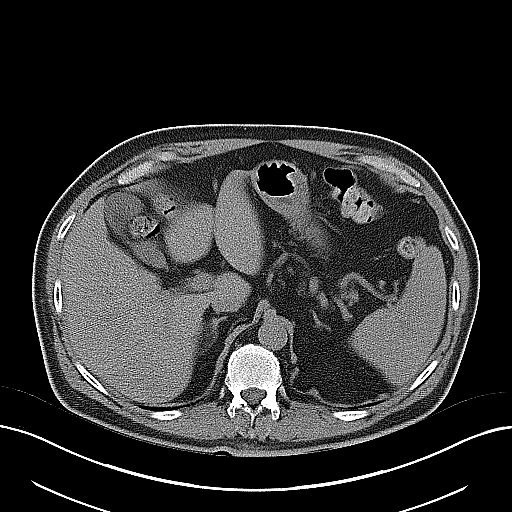
[im 6/24  soft-tissue]
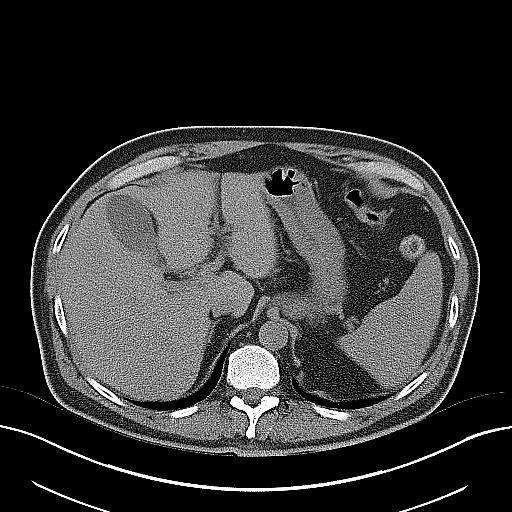
[im 7/24  soft-tissue]
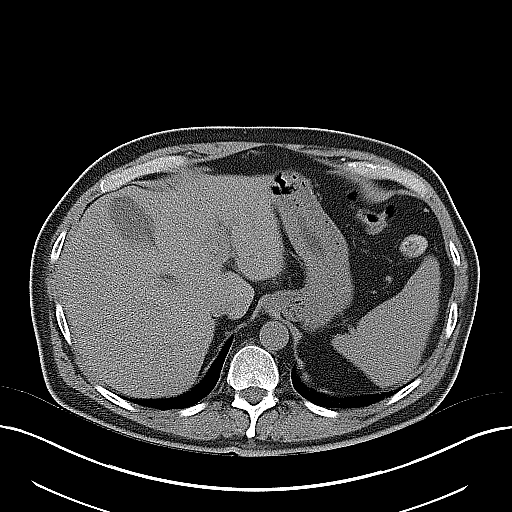
[im 9/24  soft-tissue]
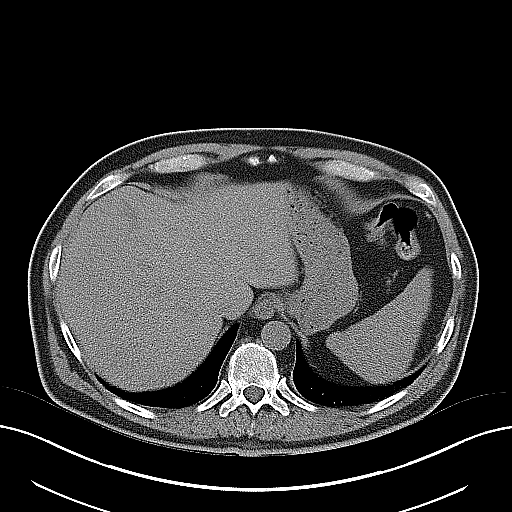
[im 11/24  soft-tissue]
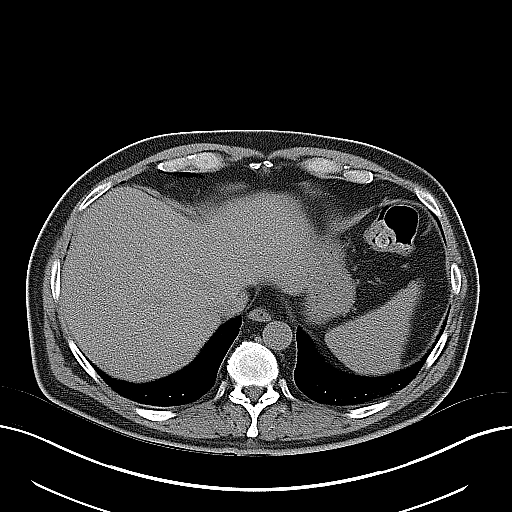
[im 13/24  soft-tissue]
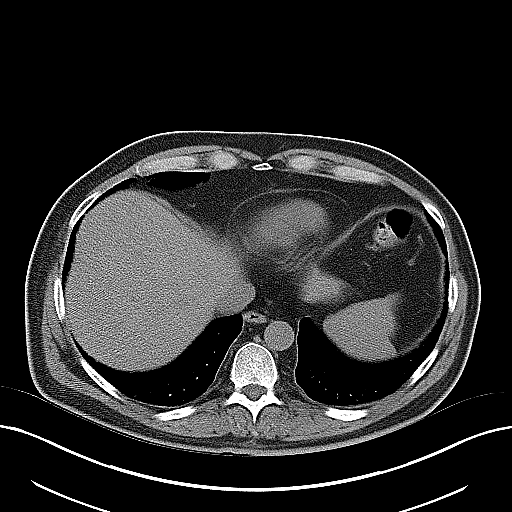
[im 14/24  soft-tissue]
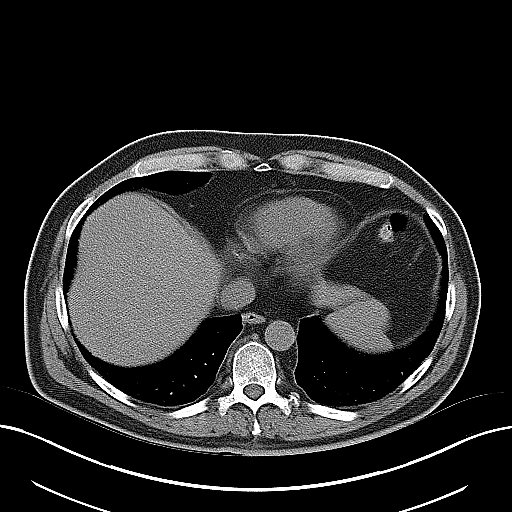
[im 16/24  soft-tissue]
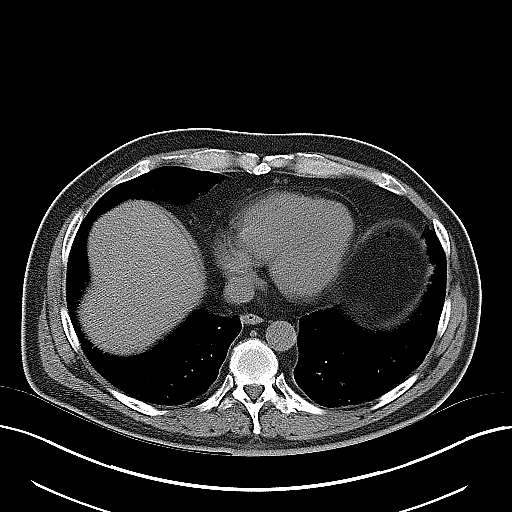
[im 16/24  bone]
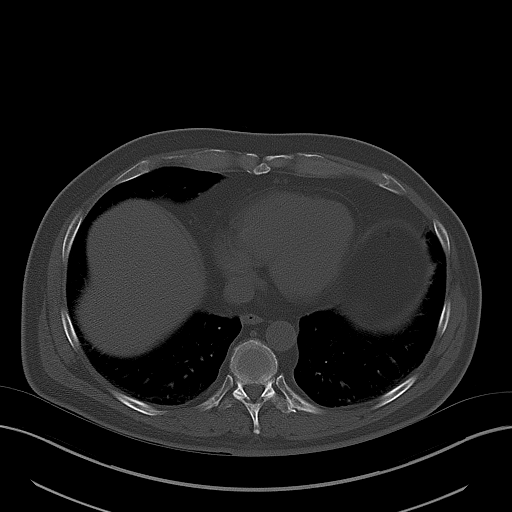
[im 18/24  soft-tissue]
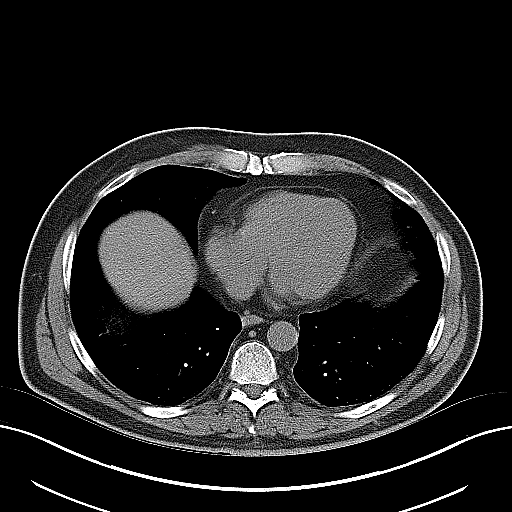
[im 19/24  soft-tissue]
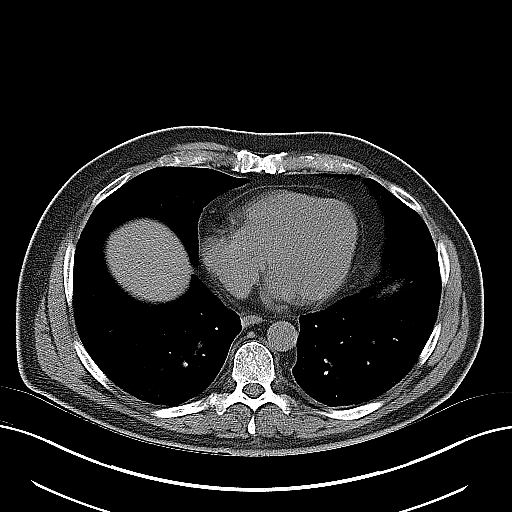
[im 20/24  lung]
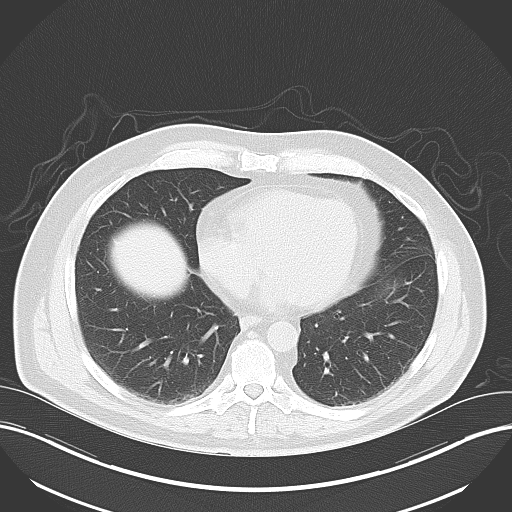
[im 21/24  soft-tissue]
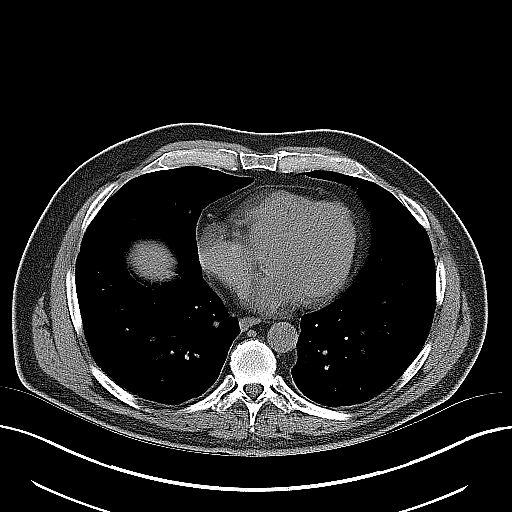
[im 21/24  lung]
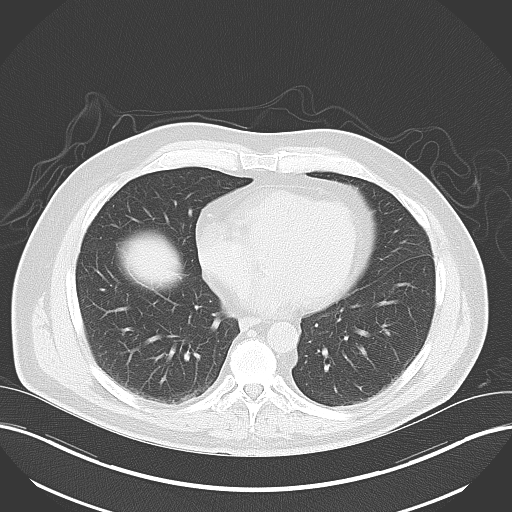
[im 22/24  lung]
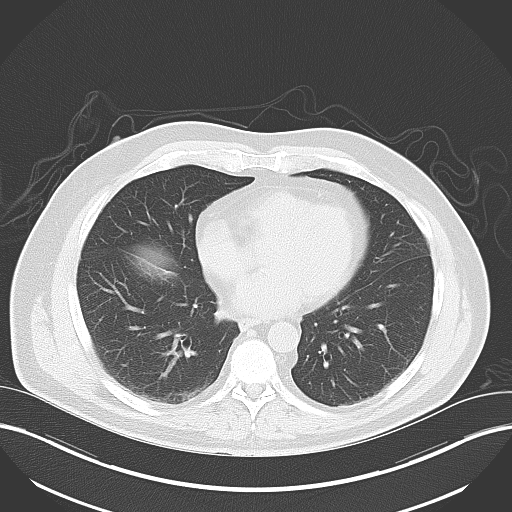
[im 23/24  soft-tissue]
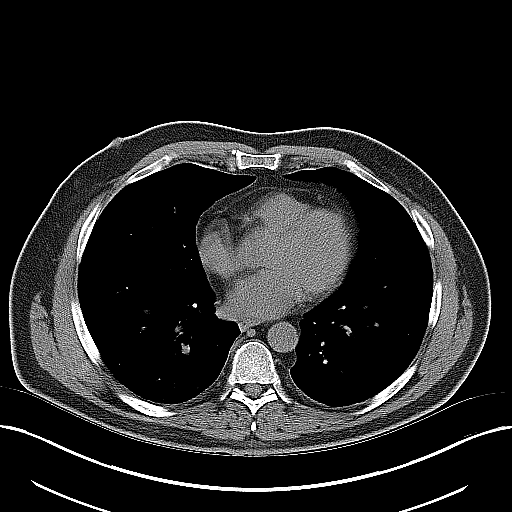
[im 23/24  lung]
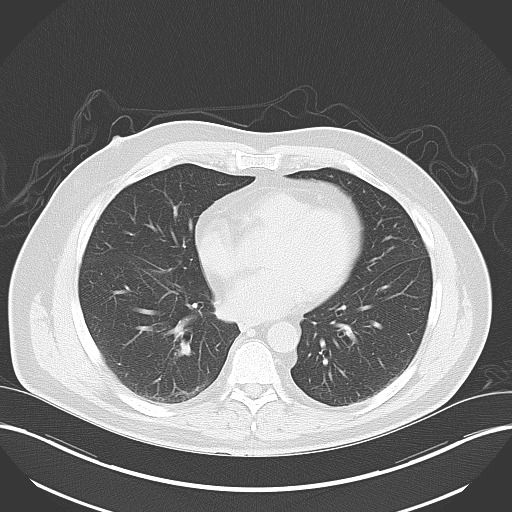

[15 of 24 positions shown; findings below may reference images not displayed]

FINDINGS: Mild dependent atelectasis seen within the visualized lung bases.

Limited noncontrast evaluation of the liver is unremarkable.
Gallbladder within normal limits. No biliary ductal dilatation. The
spleen, adrenal glands, and pancreas demonstrate a normal unenhanced
appearance.

The kidneys are symmetric in size without evidence of
nephrolithiasis or hydronephrosis. No stones seen along the course
of either renal collecting system, and there is no hydroureter.

No evidence of bowel obstruction. No abnormal wall thickening or
inflammatory fat stranding seen about the bowels. Appendix is within
normal limits.

Bladder is unremarkable. Prostate is mildly enlarged measuring
cm in maximal diameter.

No free air or fluid. No enlarged intra-abdominal or pelvic lymph
nodes. Moderate aorto bi-iliac atherosclerotic calcifications
present. Retro aortic left renal vein noted. No intra-abdominal
aneurysm.

No acute osseous abnormality. No worrisome lytic or blastic osseous
lesions. 4 mm retrolisthesis of L5 on S1 present with severe
degenerative intervertebral disc space narrowing in diffuse disc
bulge.
IMPRESSION: 1. No CT evidence of nephrolithiasis or obstructive uropathy.
2. No other acute intra-abdominal or pelvic process identified.
Normal appendix.
3. Enlarged prostate.

## 2016-03-25 ENCOUNTER — Ambulatory Visit (INDEPENDENT_AMBULATORY_CARE_PROVIDER_SITE_OTHER): Payer: BLUE CROSS/BLUE SHIELD | Admitting: Family Medicine

## 2016-03-25 ENCOUNTER — Encounter: Payer: Self-pay | Admitting: Family Medicine

## 2016-03-25 VITALS — BP 130/80 | HR 78 | Temp 97.8°F | Ht 69.0 in | Wt 185.0 lb

## 2016-03-25 DIAGNOSIS — R972 Elevated prostate specific antigen [PSA]: Secondary | ICD-10-CM | POA: Diagnosis not present

## 2016-03-25 DIAGNOSIS — Z0000 Encounter for general adult medical examination without abnormal findings: Secondary | ICD-10-CM | POA: Diagnosis not present

## 2016-03-25 DIAGNOSIS — E785 Hyperlipidemia, unspecified: Secondary | ICD-10-CM

## 2016-03-25 LAB — LDL CHOLESTEROL, DIRECT: Direct LDL: 72 mg/dL

## 2016-03-25 LAB — PSA: PSA: 3.68 ng/mL (ref 0.10–4.00)

## 2016-03-25 LAB — LIPID PANEL
Cholesterol: 231 mg/dL — ABNORMAL HIGH (ref 0–200)
HDL: 34 mg/dL — AB (ref >39.00)
Total CHOL/HDL Ratio: 7
Triglycerides: 642 mg/dL — ABNORMAL HIGH (ref 0.0–149.0)

## 2016-03-25 NOTE — Progress Notes (Signed)
Pre visit review using our clinic review tool, if applicable. No additional management support is needed unless otherwise documented below in the visit note. 

## 2016-03-25 NOTE — Progress Notes (Signed)
   Subjective:    Patient ID: Adam Villanueva, male    DOB: 1951/07/12, 65 y.o.   MRN: [id number]  HPI Patient seen for physical exam. He has history of some chronic insomnia, dyslipidemia, hypertension, GERD. He had some recent neuropathy symptoms and we did fairly extensive workup which was all normal. His neuropathy symptoms are slightly better at this time. He is not using alcohol regularly. He has history of high triglycerides and takes Fish oil supplement.  Immunizations up-to-date. Will need repeat colonoscopy in the next year.  Past Medical History  Diagnosis Date  . Arthritis   . Allergy   . Hypertension   . Hyperlipidemia   . Prostatitis   . Chronic insomnia   . Anxiety   . Cataract    No past surgical history on file.  reports that he has never smoked. He has never used smokeless tobacco. He reports that he drinks alcohol. He reports that he does not use illicit drugs. family history includes Arthritis in his father, maternal grandfather, maternal grandmother, mother, paternal grandfather, and paternal grandmother; Cancer in his mother; Hyperlipidemia in his mother; Hypertension in his father and mother. No Known Allergies      Review of Systems  Constitutional: Negative for fever, activity change, appetite change and fatigue.  HENT: Negative for congestion, ear pain and trouble swallowing.   Eyes: Negative for pain and visual disturbance.  Respiratory: Negative for cough, shortness of breath and wheezing.   Cardiovascular: Negative for chest pain and palpitations.  Gastrointestinal: Negative for nausea, vomiting, abdominal pain, diarrhea, constipation, blood in stool, abdominal distention and rectal pain.  Genitourinary: Negative for dysuria, hematuria and testicular pain.  Musculoskeletal: Negative for joint swelling and arthralgias.  Skin: Negative for rash.  Neurological: Positive for numbness. Negative for dizziness, syncope and headaches.  Hematological:  Negative for adenopathy.  Psychiatric/Behavioral: Negative for confusion and dysphoric mood.       Objective:   Physical Exam  Constitutional: He is oriented to person, place, and time. He appears well-developed and well-nourished. No distress.  HENT:  Head: Normocephalic and atraumatic.  Right Ear: External ear normal.  Left Ear: External ear normal.  Mouth/Throat: Oropharynx is clear and moist.  Eyes: Conjunctivae and EOM are normal. Pupils are equal, round, and reactive to light.  Neck: Normal range of motion. Neck supple. No thyromegaly present.  Cardiovascular: Normal rate, regular rhythm and normal heart sounds.   No murmur heard. Pulmonary/Chest: No respiratory distress. He has no wheezes. He has no rales.  Abdominal: Soft. Bowel sounds are normal. He exhibits no distension and no mass. There is no tenderness. There is no rebound and no guarding.  Musculoskeletal: He exhibits no edema.  Lymphadenopathy:    He has no cervical adenopathy.  Neurological: He is alert and oriented to person, place, and time. He displays normal reflexes. No cranial nerve deficit.  Skin: No rash noted.  Psychiatric: He has a normal mood and affect.          Assessment & Plan:  Physical exam. Add PSA and lipids which were not done with recent labs. We discussed lifestyle management of high triglycerides. We've recommended consistent exercise. Limit alcohol use. Decrease sugar and white starches intake. Repeat colonoscopy next year  Eulas Post MD [facility]

## 2016-03-27 NOTE — Addendum Note (Signed)
Addended by: Elio Forget on: 03/27/2016 10:19 AM   Modules accepted: Orders, SmartSet

## 2016-06-15 ENCOUNTER — Telehealth: Payer: Self-pay | Admitting: Family Medicine

## 2016-06-15 MED ORDER — AMITRIPTYLINE HCL 50 MG PO TABS: 50.0000 mg | ORAL_TABLET | Freq: Every day | ORAL | 2 refills | Status: DC

## 2016-06-15 MED ORDER — ATENOLOL 50 MG PO TABS: 50.0000 mg | ORAL_TABLET | Freq: Every day | ORAL | 2 refills | Status: DC

## 2016-06-15 MED ORDER — LISINOPRIL 40 MG PO TABS: 40.0000 mg | ORAL_TABLET | Freq: Every day | ORAL | 2 refills | Status: DC

## 2016-06-15 NOTE — Telephone Encounter (Signed)
Pt request refill   lisinopril (PRINIVIL,ZESTRIL) 40 MG tablet atenolol (TENORMIN) 50 MG tablet amitriptyline (ELAVIL) 50 MG tablet  90 day  [facility]

## 2016-06-15 NOTE — Telephone Encounter (Signed)
Medication sent into the pharmacy. 

## 2016-06-30 NOTE — Telephone Encounter (Signed)
Pt would like to know if you have any suggestions about atenolol the mail order pharmacy is not able to get it. And pt state that it is cheaper to get his medication from the mail order pharmacy.

## 2016-06-30 NOTE — Telephone Encounter (Signed)
D/c atenolol and start Metoprolol XL 50 mg po once daily and office follow up in 4 weeks to re-check BP.

## 2016-06-30 NOTE — Telephone Encounter (Signed)
Tried calling pt with no answer. Is there an alternate medication pt can try with his other medications with an OV to follow?

## 2016-07-01 MED ORDER — METOPROLOL SUCCINATE ER 50 MG PO TB24: 50.0000 mg | ORAL_TABLET | Freq: Every day | ORAL | 2 refills | Status: DC

## 2016-07-01 NOTE — Telephone Encounter (Signed)
Pt calling to check the status of Rx °

## 2016-07-01 NOTE — Telephone Encounter (Signed)
Medication sent in for patient and he is aware.

## 2016-07-15 ENCOUNTER — Ambulatory Visit (INDEPENDENT_AMBULATORY_CARE_PROVIDER_SITE_OTHER): Payer: BLUE CROSS/BLUE SHIELD | Admitting: Family Medicine

## 2016-07-15 DIAGNOSIS — Z23 Encounter for immunization: Secondary | ICD-10-CM | POA: Diagnosis not present

## 2016-08-04 ENCOUNTER — Telehealth: Payer: Self-pay | Admitting: Family Medicine

## 2016-08-04 MED ORDER — PANTOPRAZOLE SODIUM 40 MG PO TBEC: 40.0000 mg | DELAYED_RELEASE_TABLET | Freq: Every day | ORAL | 3 refills | Status: DC

## 2016-08-04 NOTE — Telephone Encounter (Signed)
Medication sent to pharmacy  

## 2016-08-04 NOTE — Telephone Encounter (Signed)
° ° °  Pt request refill of the following:  pantoprazole (PROTONIX) 40 MG tablet  90 day supply    Phamacy:  [facility]

## 2016-08-06 ENCOUNTER — Encounter: Payer: Self-pay | Admitting: Internal Medicine

## 2016-09-23 ENCOUNTER — Other Ambulatory Visit (INDEPENDENT_AMBULATORY_CARE_PROVIDER_SITE_OTHER): Payer: BLUE CROSS/BLUE SHIELD

## 2016-09-23 DIAGNOSIS — E785 Hyperlipidemia, unspecified: Secondary | ICD-10-CM | POA: Diagnosis not present

## 2016-09-23 DIAGNOSIS — R972 Elevated prostate specific antigen [PSA]: Secondary | ICD-10-CM

## 2016-09-23 LAB — LIPID PANEL
CHOLESTEROL: 190 mg/dL (ref 0–200)
HDL: 32 mg/dL — ABNORMAL LOW (ref >39.00)
Total CHOL/HDL Ratio: 6

## 2016-09-23 LAB — PSA: PSA: 3.92 ng/mL (ref 0.10–4.00)

## 2016-09-23 LAB — LDL CHOLESTEROL, DIRECT: LDL DIRECT: 81 mg/dL

## 2016-11-03 ENCOUNTER — Encounter: Payer: Self-pay | Admitting: Internal Medicine

## 2016-11-09 ENCOUNTER — Ambulatory Visit (INDEPENDENT_AMBULATORY_CARE_PROVIDER_SITE_OTHER): Payer: BLUE CROSS/BLUE SHIELD | Admitting: Family Medicine

## 2016-11-09 ENCOUNTER — Encounter: Payer: Self-pay | Admitting: Family Medicine

## 2016-11-09 VITALS — BP 122/90 | HR 126 | Temp 97.7°F | Ht 69.0 in | Wt 179.0 lb

## 2016-11-09 DIAGNOSIS — R112 Nausea with vomiting, unspecified: Secondary | ICD-10-CM

## 2016-11-09 MED ORDER — ONDANSETRON 8 MG PO TBDP: 8.0000 mg | ORAL_TABLET | Freq: Three times a day (TID) | ORAL | 0 refills | Status: DC | PRN

## 2016-11-09 NOTE — Progress Notes (Signed)
Pre visit review using our clinic review tool, if applicable. No additional management support is needed unless otherwise documented below in the visit note. 

## 2016-11-09 NOTE — Patient Instructions (Signed)
Nausea and Vomiting, Adult Nausea is the feeling that you have an upset stomach or have to vomit. As nausea gets worse, it can lead to vomiting. Vomiting occurs when stomach contents are thrown up and out of the mouth. Vomiting can make you feel weak and cause you to become dehydrated. Dehydration can make you tired and thirsty, cause you to have a dry mouth, and decrease how often you urinate. Older adults and people with other diseases or a weak immune system are at higher risk for dehydration. It is important to treat your nausea and vomiting as told by your health care provider. Follow these instructions at home: Follow instructions from your health care provider about how to care for yourself at home. Eating and drinking Follow these recommendations as told by your health care provider:  Take an oral rehydration solution (ORS). This is a drink that is sold at pharmacies and retail stores.  Drink clear fluids in small amounts as you are able. Clear fluids include water, ice chips, diluted fruit juice, and low-calorie sports drinks.  Eat bland, easy-to-digest foods in small amounts as you are able. These foods include bananas, applesauce, rice, lean meats, toast, and crackers.  Avoid fluids that contain a lot of sugar or caffeine, such as energy drinks, sports drinks, and soda.  Avoid alcohol.  Avoid spicy or fatty foods.  General instructions  Drink enough fluid to keep your urine clear or pale yellow.  Wash your hands often. If soap and water are not available, use hand sanitizer.  Make sure that all people in your household wash their hands well and often.  Take over-the-counter and prescription medicines only as told by your health care provider.  Rest at home while you recover.  Watch your condition for any changes.  Breathe slowly and deeply when you feel nauseated.  Keep all follow-up visits as told by your health care provider. This is important. Contact a health care  provider if:  You have a fever.  You cannot keep fluids down.  Your symptoms get worse.  You have new symptoms.  Your nausea does not go away after two days.  You feel light-headed or dizzy.  You have a headache.  You have muscle cramps. Get help right away if:  You have pain in your chest, neck, arm, or jaw.  You feel extremely weak or you faint.  You have persistent vomiting.  You see blood in your vomit.  Your vomit looks like black coffee grounds.  You have bloody or black stools or stools that look like tar.  You have a severe headache, a stiff neck, or both.  You have a rash.  You have severe pain, cramping, or bloating in your abdomen.  You have trouble breathing or you are breathing very quickly.  Your heart is beating very quickly.  Your skin feels cold and clammy.  You feel confused.  You have pain when you urinate.  You have signs of dehydration, such as: ? Dark urine, very little urine, or no urine. ? Cracked lips. ? Dry mouth. ? Sunken eyes. ? Sleepiness. ? Weakness. These symptoms may represent a serious problem that is an emergency. Do not wait to see if the symptoms will go away. Get medical help right away. Call your local emergency services (911 in the [place] Do not drive yourself to the hospital. This information is not intended to replace advice given to you by your health care provider. Make sure you discuss any questions you   have with your health care provider. Document Released: 09/14/2005 Document Revised: 02/17/2016 Document Reviewed: 05/21/2015 Elsevier Interactive Patient Education  2017 [employer]  

## 2016-11-09 NOTE — Progress Notes (Signed)
Subjective:     Patient ID: Adam Villanueva, male   DOB: 1951/07/03, 66 y.o.   MRN: [id number]  HPI Patient seen with onset last night of vomiting. He cannot give a number but he vomited several episodes. Had some persistent nausea today but no vomiting since noon. No diarrhea. No fevers or chills. Mild body aches. No localizing abdominal pain. No dysuria. No cough. No hematemesis. No melena.  Past Medical History:  Diagnosis Date  . Allergy   . Anxiety   . Arthritis   . Cataract   . Chronic insomnia   . Hyperlipidemia   . Hypertension   . Prostatitis    No past surgical history on file.  reports that he has never smoked. He has never used smokeless tobacco. He reports that he drinks alcohol. He reports that he does not use drugs. family history includes Arthritis in his father, maternal grandfather, maternal grandmother, mother, paternal grandfather, and paternal grandmother; Cancer in his mother; Hyperlipidemia in his mother; Hypertension in his father and mother. No Known Allergies   Review of Systems  Constitutional: Negative for chills and fever.  Respiratory: Negative for shortness of breath.   Cardiovascular: Negative for chest pain.  Gastrointestinal: Positive for nausea and vomiting. Negative for blood in stool and diarrhea.       Objective:   Physical Exam  Constitutional: He appears well-developed and well-nourished.  HENT:  Mouth/Throat: Oropharynx is clear and moist.  Cardiovascular: Normal rate and regular rhythm.   Pulmonary/Chest: Effort normal and breath sounds normal. No respiratory distress. He has no wheezes. He has no rales.  Abdominal: Soft. Bowel sounds are normal. He exhibits no distension and no mass. There is no tenderness. There is no rebound and no guarding.       Assessment:     Vomiting. Suspect acute enteritis. Nonfocal exam.    Plan:     -Zofran 8 mg every 8 hours as needed for nausea and vomiting -Clear liquids as tolerated -Follow-up  for any localizing abdominal pain, persistent vomiting, or other concerns  Eulas Post MD [facility]

## 2016-12-23 ENCOUNTER — Ambulatory Visit (AMBULATORY_SURGERY_CENTER): Payer: Self-pay | Admitting: *Deleted

## 2016-12-23 ENCOUNTER — Encounter: Payer: Self-pay | Admitting: Internal Medicine

## 2016-12-23 VITALS — Ht 70.0 in | Wt 184.0 lb

## 2016-12-23 DIAGNOSIS — Z1211 Encounter for screening for malignant neoplasm of colon: Secondary | ICD-10-CM

## 2016-12-23 MED ORDER — NA SULFATE-K SULFATE-MG SULF 17.5-3.13-1.6 GM/177ML PO SOLN: 1.0000 | Freq: Once | ORAL | 0 refills | Status: AC

## 2016-12-23 NOTE — Progress Notes (Signed)
No egg or soy allergy known to patient  No issues with past sedation with any surgeries  or procedures, no intubation problems  No diet pills per patient No home 02 use per patient  No blood thinners per patient but takes tumeric  Pt states occasional  issues with constipation - occasionally uses miralax  No A fib or A flutter

## 2017-01-04 ENCOUNTER — Ambulatory Visit (AMBULATORY_SURGERY_CENTER): Payer: BLUE CROSS/BLUE SHIELD | Admitting: Internal Medicine

## 2017-01-04 ENCOUNTER — Encounter: Payer: Self-pay | Admitting: Internal Medicine

## 2017-01-04 VITALS — BP 120/73 | HR 92 | Temp 97.3°F | Resp 14 | Ht 70.0 in | Wt 184.0 lb

## 2017-01-04 DIAGNOSIS — Z1212 Encounter for screening for malignant neoplasm of rectum: Secondary | ICD-10-CM

## 2017-01-04 DIAGNOSIS — K635 Polyp of colon: Secondary | ICD-10-CM

## 2017-01-04 DIAGNOSIS — Z1211 Encounter for screening for malignant neoplasm of colon: Secondary | ICD-10-CM

## 2017-01-04 DIAGNOSIS — D120 Benign neoplasm of cecum: Secondary | ICD-10-CM

## 2017-01-04 MED ORDER — SODIUM CHLORIDE 0.9 % IV SOLN: 500.0000 mL | INTRAVENOUS | Status: DC

## 2017-01-04 NOTE — Op Note (Signed)
[facility] Patient Name: Adam Villanueva Procedure Date: 01/04/2017 11:19 AM MRN: [id number] Endoscopist: Docia Chuck. Henrene Pastor , MD Age: 65 Referring MD:  Date of Birth: December 16, 1950 Gender: Male Account #: [id number] Procedure:                Colonoscopy with snare polypectomy x 1 Indications:              Screening for colorectal malignant neoplasm.                            Negative index examination with Dr. Velora Heckler May 2008 Medicines:                Monitored Anesthesia Care Procedure:                Pre-Anesthesia Assessment:                           - Prior to the procedure, a History and Physical                            was performed, and patient medications and                            allergies were reviewed. The patient's tolerance of                            previous anesthesia was also reviewed. The risks                            and benefits of the procedure and the sedation                            options and risks were discussed with the patient.                            All questions were answered, and informed consent                            was obtained. Prior Anticoagulants: The patient has                            taken no previous anticoagulant or antiplatelet                            agents. ASA Grade Assessment: II - A patient with                            mild systemic disease. After reviewing the risks                            and benefits, the patient was deemed in                            satisfactory condition to undergo the procedure.  After obtaining informed consent, the colonoscope                            was passed under direct vision. Throughout the                            procedure, the patient's blood pressure, pulse, and                            oxygen saturations were monitored continuously. The                            Colonoscope was introduced through the anus and           advanced to the the cecum, identified by                            appendiceal orifice and ileocecal valve. The                            ileocecal valve, appendiceal orifice, and rectum                            were photographed. The quality of the bowel                            preparation was excellent. The colonoscopy was                            performed without difficulty. The patient tolerated                            the procedure well. The bowel preparation used was                            SUPREP. Scope In: 11:26:38 AM Scope Out: 11:41:13 AM Scope Withdrawal Time: 0 hours 12 minutes 11 seconds  Total Procedure Duration: 0 hours 14 minutes 35 seconds  Findings:                 A 6 mm polyp was found in the cecum. The polyp was                            sessile. The polyp was removed with a cold snare.                            Resection and retrieval were complete.                           Multiple diverticula were found in the sigmoid                            colon.                           Internal  hemorrhoids were found during retroflexion.                           The exam was otherwise without abnormality on                            direct and retroflexion views. Complications:            No immediate complications. Estimated blood loss:                            None. Estimated Blood Loss:     Estimated blood loss: none. Impression:               - One 6 mm polyp in the cecum, removed with a cold                            snare. Resected and retrieved.                           - Diverticulosis in the sigmoid colon.                           - Internal hemorrhoids.                           - The examination was otherwise normal on direct                            and retroflexion views. Recommendation:           - Repeat colonoscopy in 5-10 years for                            surveillance, based on final pathology.                            - Patient has a contact number available for                            emergencies. The signs and symptoms of potential                            delayed complications were discussed with the                            patient. Return to normal activities tomorrow.                            Written discharge instructions were provided to the                            patient.                           - Resume previous diet.                           -  Continue present medications.                           - Await pathology results. Docia Chuck. Henrene Pastor, MD 01/04/2017 11:45:51 AM This report has been signed electronically.

## 2017-01-04 NOTE — Progress Notes (Signed)
Report to PACU, RN, vss, BBS= Clear.  

## 2017-01-04 NOTE — Progress Notes (Signed)
Pt's states no medical or surgical changes since previsit or office visit. 

## 2017-01-04 NOTE — Progress Notes (Signed)
Called to room to assist during endoscopic procedure.  Patient ID and intended procedure confirmed with present staff. Received instructions for my participation in the procedure from the performing physician.  

## 2017-01-04 NOTE — Patient Instructions (Signed)
Handouts given : Polyps, Diverticulosis and Hemorrhoids.   YOU HAD AN ENDOSCOPIC PROCEDURE TODAY AT THE [facility]:   Refer to the procedure report that was given to you for any specific questions about what was found during the examination.  If the procedure report does not answer your questions, please call your gastroenterologist to clarify.  If you requested that your care partner not be given the details of your procedure findings, then the procedure report has been included in a sealed envelope for you to review at your convenience later.  YOU SHOULD EXPECT: Some feelings of bloating in the abdomen. Passage of more gas than usual.  Walking can help get rid of the air that was put into your GI tract during the procedure and reduce the bloating. If you had a lower endoscopy (such as a colonoscopy or flexible sigmoidoscopy) you may notice spotting of blood in your stool or on the toilet paper. If you underwent a bowel prep for your procedure, you may not have a normal bowel movement for a few days.  Please Note:  You might notice some irritation and congestion in your nose or some drainage.  This is from the oxygen used during your procedure.  There is no need for concern and it should clear up in a day or so.  SYMPTOMS TO REPORT IMMEDIATELY:   Following lower endoscopy (colonoscopy or flexible sigmoidoscopy):  Excessive amounts of blood in the stool  Significant tenderness or worsening of abdominal pains  Swelling of the abdomen that is new, acute  Fever of 100F or higher   For urgent or emergent issues, a gastroenterologist can be reached at any hour by calling [phone].   DIET:  We do recommend a small meal at first, but then you may proceed to your regular diet.  Drink plenty of fluids but you should avoid alcoholic beverages for 24 hours.  ACTIVITY:  You should plan to take it easy for the rest of today and you should NOT DRIVE or use heavy machinery until  tomorrow (because of the sedation medicines used during the test).    FOLLOW UP: Our staff will call the number listed on your records the next business day following your procedure to check on you and address any questions or concerns that you may have regarding the information given to you following your procedure. If we do not reach you, we will leave a message.  However, if you are feeling well and you are not experiencing any problems, there is no need to return our call.  We will assume that you have returned to your regular daily activities without incident.  If any biopsies were taken you will be contacted by phone or by letter within the next 1-3 weeks.  Please call us at [phone] if you have not heard about the biopsies in 3 weeks.    SIGNATURES/CONFIDENTIALITY: You and/or your care partner have signed paperwork which will be entered into your electronic medical record.  These signatures attest to the fact that that the information above on your After Visit Summary has been reviewed and is understood.  Full responsibility of the confidentiality of this discharge information lies with you and/or your care-partner. 

## 2017-01-05 ENCOUNTER — Telehealth: Payer: Self-pay | Admitting: *Deleted

## 2017-01-05 NOTE — Telephone Encounter (Signed)
No answer. Name identifier. Message left to call if questions or concerns and that we would attempt to call later in the day.

## 2017-01-05 NOTE — Telephone Encounter (Signed)
  Follow up Call-  Call back number 01/04/2017  Post procedure Call Back phone  # [phone]  Permission to leave phone message Yes  Some recent data might be hidden     No answer at # given.  LM on VM to call back with questions or concerns.

## 2017-01-12 ENCOUNTER — Encounter: Payer: Self-pay | Admitting: Internal Medicine

## 2017-01-20 ENCOUNTER — Telehealth: Payer: Self-pay | Admitting: Internal Medicine

## 2017-01-20 NOTE — Telephone Encounter (Signed)
Pt states he has had diarrhea since his colon. Pt took imodium yesterday and today and states it has gotten some better. Pt reports only one stool today. Discussed with pt that he may want to try the imodium a couple more days and see if the diarrhea stops. Instructed pt to call the office back if the diarrhea continues. Pt verbalized understanding.

## 2017-02-24 ENCOUNTER — Ambulatory Visit (INDEPENDENT_AMBULATORY_CARE_PROVIDER_SITE_OTHER): Payer: BLUE CROSS/BLUE SHIELD | Admitting: Family Medicine

## 2017-02-24 ENCOUNTER — Encounter: Payer: Self-pay | Admitting: Family Medicine

## 2017-02-24 VITALS — BP 102/70 | HR 88 | Temp 98.4°F | Ht 70.0 in | Wt 183.1 lb

## 2017-02-24 DIAGNOSIS — S30861A Insect bite (nonvenomous) of abdominal wall, initial encounter: Secondary | ICD-10-CM

## 2017-02-24 DIAGNOSIS — F5104 Psychophysiologic insomnia: Secondary | ICD-10-CM

## 2017-02-24 DIAGNOSIS — W57XXXA Bitten or stung by nonvenomous insect and other nonvenomous arthropods, initial encounter: Secondary | ICD-10-CM

## 2017-02-24 DIAGNOSIS — R197 Diarrhea, unspecified: Secondary | ICD-10-CM

## 2017-02-24 NOTE — Progress Notes (Signed)
Subjective:     Patient ID: Adam Villanueva, male   DOB: [birth year], 66 y.o.   MRN: [id number]  HPI Patient had recent colonoscopy April 9 of this year. He states following colonoscopy had some frequent loose stools up to 3 per day. These have reduced greatly over the past week currently about one per day and has even had some regular formed stools over the past week. He denies any bloody stools. No fever. No nausea or vomiting. No abdominal pain. Good appetite. No recent antibiotics or travels. Does use some artificial sweeteners which he started on a couple months ago. No history of lactose intolerance or gluten sensitivity.  Recent tick bite right lower abdomen. This appeared to be a small deer tick. He has some local itching. No fever. No arthralgias. No headache.  Chronic insomnia on Elavil. He tried to quit this cold from 50 mg daily and had significant insomnia issues. No regular alcohol use. No daytime naps.  Past Medical History:  Diagnosis Date  . Allergy   . Anxiety   . Arthritis   . Cataract   . Chronic insomnia   . GERD (gastroesophageal reflux disease)   . Hyperlipidemia   . Hypertension   . Lower back pain    and mid back pain  . Prostatitis   . Sleep apnea    no cpap   Past Surgical History:  Procedure Laterality Date  . COLONOSCOPY    . POLYPECTOMY    . SEPTOPLASTY     broken nose  . WISDOM TOOTH EXTRACTION      reports that he has never smoked. He has never used smokeless tobacco. He reports that he drinks alcohol. He reports that he does not use drugs. family history includes Arthritis in his father, maternal grandfather, maternal grandmother, mother, paternal grandfather, and paternal grandmother; Cancer in his mother; Hyperlipidemia in his mother; Hypertension in his father and mother; Rectal cancer in his mother; Throat cancer in his brother. No Known Allergies   Review of Systems  Constitutional: Negative for appetite change, chills, fatigue, fever and  unexpected weight change.  Eyes: Negative for visual disturbance.  Respiratory: Negative for cough, chest tightness and shortness of breath.   Cardiovascular: Negative for chest pain, palpitations and leg swelling.  Gastrointestinal: Negative for abdominal pain.  Genitourinary: Negative for dysuria.  Skin: Positive for rash.  Neurological: Negative for dizziness, syncope, weakness, light-headedness and headaches.       Objective:   Physical Exam  Constitutional: He is oriented to person, place, and time. He appears well-developed and well-nourished.  HENT:  Right Ear: External ear normal.  Left Ear: External ear normal.  Mouth/Throat: Oropharynx is clear and moist.  Eyes: Pupils are equal, round, and reactive to light.  Neck: Neck supple. No thyromegaly present.  Cardiovascular: Normal rate and regular rhythm.   Pulmonary/Chest: Effort normal and breath sounds normal. No respiratory distress. He has no wheezes. He has no rales.  Abdominal: Soft. He exhibits no distension and no mass. There is no tenderness. There is no rebound and no guarding.  Musculoskeletal: He exhibits no edema.  Neurological: He is alert and oriented to person, place, and time.  Skin:  Patient has small area of erythema about 2 cm diameter right lower abdominal region. Small punctate area site of tick bite. No obvious retained tick parts.       Assessment:     #1 frequent loose stools which appear to be resolving. He does not read flags such  as fever, weight loss, bloody stools, etc. Possibly related to artificial sweetener use  #2 recent tick bite right lower abdomen with local allergic reaction  #3 chronic insomnia    Plan:     -Reduce use of artificial sweeteners -Follow-up promptly for any fever, severe headache, or progressive skin rash -Try reducing Elavil 25 mg daily at bedtime for one month and then if doing well with that consider reducing to 10 mg for one month and then hopefully can  discontinue altogether  Eulas Post MD [facility] at [facility]

## 2017-02-24 NOTE — Patient Instructions (Signed)
Try to avoid any artificial sweeteners Try to get 25-30 grams of fiber per day Consider reducing Elavil to one half tablet at night We could then try to further reduce Elavil to 10 mg at night in about one month.

## 2017-03-26 ENCOUNTER — Encounter: Payer: Self-pay | Admitting: Family Medicine

## 2017-03-26 ENCOUNTER — Ambulatory Visit (INDEPENDENT_AMBULATORY_CARE_PROVIDER_SITE_OTHER): Payer: BLUE CROSS/BLUE SHIELD | Admitting: Family Medicine

## 2017-03-26 ENCOUNTER — Ambulatory Visit: Payer: BLUE CROSS/BLUE SHIELD | Admitting: Family Medicine

## 2017-03-26 VITALS — BP 110/70 | HR 98 | Temp 98.3°F | Ht 69.5 in | Wt 183.3 lb

## 2017-03-26 DIAGNOSIS — F5104 Psychophysiologic insomnia: Secondary | ICD-10-CM

## 2017-03-26 DIAGNOSIS — E785 Hyperlipidemia, unspecified: Secondary | ICD-10-CM | POA: Diagnosis not present

## 2017-03-26 DIAGNOSIS — R972 Elevated prostate specific antigen [PSA]: Secondary | ICD-10-CM | POA: Diagnosis not present

## 2017-03-26 DIAGNOSIS — Z0000 Encounter for general adult medical examination without abnormal findings: Secondary | ICD-10-CM

## 2017-03-26 DIAGNOSIS — I10 Essential (primary) hypertension: Secondary | ICD-10-CM

## 2017-03-26 LAB — BASIC METABOLIC PANEL
BUN: 11 mg/dL (ref 6–23)
CHLORIDE: 101 meq/L (ref 96–112)
CO2: 30 meq/L (ref 19–32)
CREATININE: 1.04 mg/dL (ref 0.40–1.50)
Calcium: 9.5 mg/dL (ref 8.4–10.5)
GFR: 76.42 mL/min (ref >60.00)
Glucose, Bld: 99 mg/dL (ref 70–99)
POTASSIUM: 4.6 meq/L (ref 3.5–5.1)
Sodium: 139 mEq/L (ref 135–145)

## 2017-03-26 LAB — LIPID PANEL
CHOL/HDL RATIO: 6
CHOLESTEROL: 177 mg/dL (ref 0–200)
HDL: 30 mg/dL — ABNORMAL LOW (ref >39.00)

## 2017-03-26 LAB — HEPATIC FUNCTION PANEL
ALT: 17 U/L (ref 0–53)
AST: 18 U/L (ref 0–37)
Albumin: 4.3 g/dL (ref 3.5–5.2)
Alkaline Phosphatase: 59 U/L (ref 39–117)
BILIRUBIN DIRECT: 0.1 mg/dL (ref 0.0–0.3)
BILIRUBIN TOTAL: 0.5 mg/dL (ref 0.2–1.2)
Total Protein: 6.8 g/dL (ref 6.0–8.3)

## 2017-03-26 LAB — PSA: PSA: 3.69 ng/mL (ref 0.10–4.00)

## 2017-03-26 LAB — LDL CHOLESTEROL, DIRECT: Direct LDL: 55 mg/dL

## 2017-03-26 MED ORDER — PANTOPRAZOLE SODIUM 40 MG PO TBEC: 40.0000 mg | DELAYED_RELEASE_TABLET | Freq: Every day | ORAL | 3 refills | Status: DC

## 2017-03-26 MED ORDER — METOPROLOL SUCCINATE ER 50 MG PO TB24: 50.0000 mg | ORAL_TABLET | Freq: Every day | ORAL | 3 refills | Status: DC

## 2017-03-26 MED ORDER — SILDENAFIL CITRATE 20 MG PO TABS: ORAL_TABLET | ORAL | 5 refills | Status: DC

## 2017-03-26 MED ORDER — LISINOPRIL 40 MG PO TABS: 40.0000 mg | ORAL_TABLET | Freq: Every day | ORAL | 3 refills | Status: DC

## 2017-03-26 MED ORDER — ROSUVASTATIN CALCIUM 10 MG PO TABS: 10.0000 mg | ORAL_TABLET | Freq: Every day | ORAL | 3 refills | Status: DC

## 2017-03-26 MED ORDER — AMITRIPTYLINE HCL 25 MG PO TABS: 25.0000 mg | ORAL_TABLET | Freq: Every day | ORAL | 1 refills | Status: DC

## 2017-03-26 NOTE — Patient Instructions (Signed)
Try reducing the Elavil to 25 mg at night. If that goes OK, we will try to reduce further to 10 mg in about one month.

## 2017-03-26 NOTE — Progress Notes (Signed)
Subjective:     Patient ID: Adam Villanueva, male   DOB: 1951/05/17, 66 y.o.   MRN: [id number]  HPI Patient here for complete physical. His chronic problems  good history of chronic insomnia, hyperlipidemia, osteoarthritis, GERD, dyslipidemia. He is currently on Crestor only takes about 3 times for week. Unable to tolerate higher doses.  Has been on Elavil for several years. Currently 50 mg daily. We discussed our desire to try to get him off this if possible-especially as he gets older with multiple potential risks of anticholinergic medications.  Patient has history of erectile dysfunction and requesting generic for Viagra. No history of CAD. No nitroglycerin use.  Immunizations and colonoscopy up-to-date .  Patient has had slightly increased PSA velocity. Most recent PSA about 6 months ago upper 3 range. Needs follow-up.  Past Medical History:  Diagnosis Date  . Allergy   . Anxiety   . Arthritis   . Cataract   . Chronic insomnia   . GERD (gastroesophageal reflux disease)   . Hyperlipidemia   . Hypertension   . Lower back pain    and mid back pain  . Prostatitis   . Sleep apnea    no cpap   Past Surgical History:  Procedure Laterality Date  . COLONOSCOPY    . POLYPECTOMY    . SEPTOPLASTY     broken nose  . WISDOM TOOTH EXTRACTION      reports that he has never smoked. He has never used smokeless tobacco. He reports that he drinks alcohol. He reports that he does not use drugs. family history includes Arthritis in his father, maternal grandfather, maternal grandmother, mother, paternal grandfather, and paternal grandmother; Cancer in his mother; Hyperlipidemia in his mother; Hypertension in his father and mother; Rectal cancer in his mother; Throat cancer in his brother. No Known Allergies   Review of Systems  Constitutional: Negative for activity change, appetite change, fatigue and fever.  HENT: Negative for congestion, ear pain and trouble swallowing.   Eyes: Negative  for pain and visual disturbance.  Respiratory: Negative for cough, shortness of breath and wheezing.   Cardiovascular: Negative for chest pain and palpitations.  Gastrointestinal: Negative for abdominal distention, abdominal pain, blood in stool, constipation, diarrhea, nausea, rectal pain and vomiting.  Genitourinary: Negative for dysuria, hematuria and testicular pain.  Musculoskeletal: Positive for arthralgias. Negative for joint swelling.  Skin: Negative for rash.  Neurological: Negative for dizziness, syncope and headaches.  Hematological: Negative for adenopathy.  Psychiatric/Behavioral: Negative for confusion and dysphoric mood.       Objective:   Physical Exam  Constitutional: He is oriented to person, place, and time. He appears well-developed and well-nourished. No distress.  HENT:  Head: Normocephalic and atraumatic.  Right Ear: External ear normal.  Left Ear: External ear normal.  Mouth/Throat: Oropharynx is clear and moist.  Eyes: Conjunctivae and EOM are normal. Pupils are equal, round, and reactive to light.  Neck: Normal range of motion. Neck supple. No thyromegaly present.  Cardiovascular: Normal rate, regular rhythm and normal heart sounds.   No murmur heard. Pulmonary/Chest: No respiratory distress. He has no wheezes. He has no rales.  Abdominal: Soft. Bowel sounds are normal. He exhibits no distension and no mass. There is no tenderness. There is no rebound and no guarding.  Musculoskeletal: He exhibits no edema.  Lymphadenopathy:    He has no cervical adenopathy.  Neurological: He is alert and oriented to person, place, and time. He displays normal reflexes. No cranial nerve deficit.  Skin: No rash noted.  Psychiatric: He has a normal mood and affect.       Assessment:     Physical exam. Patient has multiple stable medical problems as above    Plan:     -Repeat labs with lipid, hepatic, basic metabolic panel, PSA -Try reducing Elavil to 25 mg at night  for 1 month and hopefully can reduce further 10 mg and eventually off -Prescription for generic sildenafil -Other regular medications refilled for 1 year -Continue yearly flu vaccine  Eulas Post MD [facility] at [facility]

## 2017-07-05 ENCOUNTER — Encounter: Payer: Self-pay | Admitting: Family Medicine

## 2017-07-05 ENCOUNTER — Ambulatory Visit (INDEPENDENT_AMBULATORY_CARE_PROVIDER_SITE_OTHER): Payer: BLUE CROSS/BLUE SHIELD | Admitting: Family Medicine

## 2017-07-05 VITALS — BP 126/78 | HR 76 | Temp 98.1°F | Resp 16 | Wt 188.0 lb

## 2017-07-05 DIAGNOSIS — Z23 Encounter for immunization: Secondary | ICD-10-CM | POA: Diagnosis not present

## 2017-07-05 DIAGNOSIS — F5104 Psychophysiologic insomnia: Secondary | ICD-10-CM

## 2017-07-05 DIAGNOSIS — I10 Essential (primary) hypertension: Secondary | ICD-10-CM | POA: Diagnosis not present

## 2017-07-05 DIAGNOSIS — K219 Gastro-esophageal reflux disease without esophagitis: Secondary | ICD-10-CM | POA: Diagnosis not present

## 2017-07-05 DIAGNOSIS — E785 Hyperlipidemia, unspecified: Secondary | ICD-10-CM | POA: Diagnosis not present

## 2017-07-05 DIAGNOSIS — Z0000 Encounter for general adult medical examination without abnormal findings: Secondary | ICD-10-CM | POA: Diagnosis not present

## 2017-07-05 MED ORDER — PANTOPRAZOLE SODIUM 40 MG PO TBEC: 40.0000 mg | DELAYED_RELEASE_TABLET | Freq: Every day | ORAL | 11 refills | Status: DC

## 2017-07-05 NOTE — Progress Notes (Signed)
Subjective:     Patient ID: Adam Villanueva, male   DOB: [birth year], 66 y.o.   MRN: [id number]  HPI Patient seen for medical follow-up.  Chronic insomnia. He was on Elavil for years and we talked him into tapering this off. He is currently taking only 1/8 of a 25 mg tablet. Start melatonin 10 mg at night and is sleeping fairly well.  Hyperlipidemia. On Crestor but has had recurrent low back pain and some myalgias and currently taking about 3 times per week which seems to help.  Long history of GERD. No dysphagia. No appetite or weight changes other than some mild weight gain. He is requesting refills of Protonix at local pharmacy.  Hypertension which is been stable and at goal. Compliant with medications. No dizziness. No chest pains. No headaches.  Past Medical History:  Diagnosis Date  . Allergy   . Anxiety   . Arthritis   . Cataract   . Chronic insomnia   . GERD (gastroesophageal reflux disease)   . Hyperlipidemia   . Hypertension   . Lower back pain    and mid back pain  . Prostatitis   . Sleep apnea    no cpap   Past Surgical History:  Procedure Laterality Date  . COLONOSCOPY    . POLYPECTOMY    . SEPTOPLASTY     broken nose  . WISDOM TOOTH EXTRACTION      reports that he has never smoked. He has never used smokeless tobacco. He reports that he drinks alcohol. He reports that he does not use drugs. family history includes Arthritis in his father, maternal grandfather, maternal grandmother, mother, paternal grandfather, and paternal grandmother; Cancer in his mother; Hyperlipidemia in his mother; Hypertension in his father and mother; Rectal cancer in his mother; Throat cancer in his brother. No Known Allergies   Review of Systems  Constitutional: Negative for fatigue.  Eyes: Negative for visual disturbance.  Respiratory: Negative for cough, chest tightness and shortness of breath.   Cardiovascular: Negative for chest pain, palpitations and leg swelling.   Neurological: Negative for dizziness, syncope, weakness, light-headedness and headaches.       Objective:   Physical Exam  Constitutional: He is oriented to person, place, and time. He appears well-developed and well-nourished.  HENT:  Right Ear: External ear normal.  Left Ear: External ear normal.  Mouth/Throat: Oropharynx is clear and moist.  Eyes: Pupils are equal, round, and reactive to light.  Neck: Neck supple. No thyromegaly present.  Cardiovascular: Normal rate and regular rhythm.   Pulmonary/Chest: Effort normal and breath sounds normal. No respiratory distress. He has no wheezes. He has no rales.  Musculoskeletal: He exhibits no edema.  Neurological: He is alert and oriented to person, place, and time.       Assessment:     #1 chronic insomnia-stable on melatonin and tapering off Elavil  #2 hypertension stable and at goal  #3 GERD stable on Protonix  #4 hyperlipidemia    Plan:     -Flu vaccine given -Discontinue Elavil this time -Continue melatonin as needed -Refills of Protonix for one year -Routine follow-up in 8 months for physical  Eulas Post MD [facility] at [facility]

## 2017-07-05 NOTE — Patient Instructions (Signed)
Go ahead and discontinue the Amitriptyline at this time.

## 2017-10-15 ENCOUNTER — Encounter: Payer: Self-pay | Admitting: Family Medicine

## 2017-10-15 ENCOUNTER — Ambulatory Visit: Payer: Self-pay | Admitting: *Deleted

## 2017-10-15 ENCOUNTER — Ambulatory Visit: Payer: BLUE CROSS/BLUE SHIELD | Admitting: Family Medicine

## 2017-10-15 VITALS — BP 120/70 | HR 107 | Temp 98.5°F | Wt 189.2 lb

## 2017-10-15 DIAGNOSIS — J019 Acute sinusitis, unspecified: Secondary | ICD-10-CM | POA: Diagnosis not present

## 2017-10-15 MED ORDER — AMOXICILLIN-POT CLAVULANATE 875-125 MG PO TABS: 1.0000 | ORAL_TABLET | Freq: Two times a day (BID) | ORAL | 0 refills | Status: DC

## 2017-10-15 NOTE — Telephone Encounter (Signed)
Pt reports sore throat and cold/cough symptoms since 10/09/17. "Feeling worse." Unsure if febrile but "chills last night." Ear ache both ears,5/10 and headache 5/10. Productive cough for small amounts yellowish thick mucous. States unable to see if throat red.  Has taken multiple OTC meds, including Sudafed, Alka Seltzer Cold, nasal spray, with minimal relief. Same Day appt made with Dr. Elease Hashimoto. Care Advice given and instructed to call back if symptoms worsen, SOB,  prior to appt.  Reason for Disposition . Earache also present  Answer Assessment - Initial Assessment Questions 1. ONSET: "When did the throat start hurting?" (Hours or days ago)     Last weekend, 10/09/17 2. SEVERITY: "How bad is the sore throat?" (Scale 1-10; mild, moderate or severe)   - MILD (1-3):  doesn't interfere with eating or normal activities   - MODERATE (4-7): interferes with eating some solids and normal activities   - SEVERE (8-10):  excruciating pain, interferes with most normal activities   - SEVERE DYSPHAGIA: can't swallow liquids, drooling     8/10 3. STREP EXPOSURE: "Has there been any exposure to strep within the past week?" If so, ask: "What type of contact occurred?"      no 4.  VIRAL SYMPTOMS: "Are there any symptoms of a cold, such as a runny nose, cough, hoarse voice or red eyes?"      Productive cough, small amt, yellowish, congested, runny nose, headache, ear ache, both ears. 5. FEVER: "Do you have a fever?" If so, ask: "What is your temperature, how was it measured, and when did it start?"     Chills last night, unable to check. 6. PUS ON THE TONSILS: "Is there pus on the tonsils in the back of your throat?"     No "but hard to see." 7. OTHER SYMPTOMS: "Do you have any other symptoms?" (e.g., difficulty breathing, headache, rash) Stuffy,"can't breath through nose well"  Protocols used: SORE THROAT-A-AH

## 2017-10-15 NOTE — Patient Instructions (Signed)

## 2017-10-15 NOTE — Progress Notes (Signed)
Subjective:     Patient ID: Adam Villanueva, male   DOB: 10-06-1950, 67 y.o.   MRN: [id number]  HPI   Patient seen with concern for upper respiratory symptoms. Onset a little over week ago. He's had some severe sore throat, intermittent headaches, bimaxillary facial pain, bilateral earache, and cough. He tried Sudafed without improvement. He feels is getting worse if anything. Drainage was initially clear and now thick yellow discharge. No known drug allergies  Past Medical History:  Diagnosis Date  . Allergy   . Anxiety   . Arthritis   . Cataract   . Chronic insomnia   . GERD (gastroesophageal reflux disease)   . Hyperlipidemia   . Hypertension   . Lower back pain    and mid back pain  . Prostatitis   . Sleep apnea    no cpap   Past Surgical History:  Procedure Laterality Date  . COLONOSCOPY    . POLYPECTOMY    . SEPTOPLASTY     broken nose  . WISDOM TOOTH EXTRACTION      reports that  has never smoked. he has never used smokeless tobacco. He reports that he drinks alcohol. He reports that he does not use drugs. family history includes Arthritis in his father, maternal grandfather, maternal grandmother, mother, paternal grandfather, and paternal grandmother; Cancer in his mother; Hyperlipidemia in his mother; Hypertension in his father and mother; Rectal cancer in his mother; Throat cancer in his brother. No Known Allergies   Review of Systems  Constitutional: Positive for fatigue. Negative for chills and fever.  HENT: Positive for congestion, postnasal drip, sinus pressure, sinus pain and sore throat.   Respiratory: Positive for cough.   Neurological: Positive for headaches.       Objective:   Physical Exam  Constitutional: He appears well-developed and well-nourished.  HENT:  Right Ear: External ear normal.  Mouth/Throat: Oropharynx is clear and moist. No oropharyngeal exudate.  Cerumen left canal obscuring eardrum  Neck: Neck supple.  Cardiovascular: Normal rate  and regular rhythm.  Pulmonary/Chest: Effort normal and breath sounds normal. No respiratory distress. He has no wheezes. He has no rales.  Lymphadenopathy:    He has no cervical adenopathy.       Assessment:     Probable acute sinusitis    Plan:     -Augmentin 875 mg twice daily with food -Consider nasal saline irrigation of sinuses with distilled water  Eulas Post MD [facility] at [facility]

## 2017-10-15 NOTE — Telephone Encounter (Signed)
Pt is on the schedule to see Dr. Elease Hashimoto today 10/15/2017.

## 2017-12-13 ENCOUNTER — Ambulatory Visit: Payer: BLUE CROSS/BLUE SHIELD | Admitting: Family Medicine

## 2017-12-13 ENCOUNTER — Encounter: Payer: Self-pay | Admitting: Family Medicine

## 2017-12-13 VITALS — BP 120/78 | HR 69 | Temp 97.6°F | Wt 190.3 lb

## 2017-12-13 DIAGNOSIS — J069 Acute upper respiratory infection, unspecified: Secondary | ICD-10-CM | POA: Diagnosis not present

## 2017-12-13 DIAGNOSIS — K219 Gastro-esophageal reflux disease without esophagitis: Secondary | ICD-10-CM

## 2017-12-13 NOTE — Progress Notes (Signed)
Subjective:     Patient ID: Adam Villanueva, male   DOB: 04/07/1951, 67 y.o.   MRN: [id number]  HPI Patient seen with some upper abdominal pain radiating toward his substernal region last week. Somewhat of a burning quality. Not related to exertion. He had been taking quite a bit of Aleve and discontinued this and also started over-the-counter Nexium.-Couple days his symptoms had improved and pretty much resolved at this time. No nausea or vomiting. He walks 3-4 days per week about 2 miles per day and has never had any discomfort with walking.  He states he had upper respiratory infection recently and had some low-grade cough since then. Question intermittent wheezing. No melena. No pain with swallowing. No history of peptic ulcer disease. No recent appetite or weight changes.  Past Medical History:  Diagnosis Date  . Allergy   . Anxiety   . Arthritis   . Cataract   . Chronic insomnia   . GERD (gastroesophageal reflux disease)   . Hyperlipidemia   . Hypertension   . Lower back pain    and mid back pain  . Prostatitis   . Sleep apnea    no cpap   Past Surgical History:  Procedure Laterality Date  . COLONOSCOPY    . POLYPECTOMY    . SEPTOPLASTY     broken nose  . WISDOM TOOTH EXTRACTION      reports that  has never smoked. he has never used smokeless tobacco. He reports that he drinks alcohol. He reports that he does not use drugs. family history includes Arthritis in his father, maternal grandfather, maternal grandmother, mother, paternal grandfather, and paternal grandmother; Cancer in his mother; Hyperlipidemia in his mother; Hypertension in his father and mother; Rectal cancer in his mother; Throat cancer in his brother. No Known Allergies   Review of Systems  Constitutional: Negative for appetite change and unexpected weight change.  HENT: Negative for sore throat and trouble swallowing.   Respiratory: Positive for cough.   Cardiovascular: Negative for palpitations and leg  swelling.  Gastrointestinal: Positive for abdominal pain. Negative for abdominal distention, blood in stool, constipation, diarrhea, nausea and vomiting.       Objective:   Physical Exam  Constitutional: He appears well-developed and well-nourished.  Neck: Neck supple.  Cardiovascular: Normal rate and regular rhythm.  Pulmonary/Chest: Effort normal and breath sounds normal. No respiratory distress. He has no wheezes. He has no rales.  Abdominal: Soft. He exhibits no distension and no mass. There is no tenderness. There is no rebound and no guarding.       Assessment:     #1 epigastric abdominal pain with radiation substernally. Suspect symptomatic reflux. Improved following Nexium and discontinuation of Aleve. Low clinical suspicion for acute coronary symptoms.  No red flags such as appetite or weight changes.  #2 recent URI slowly improving    Plan:     -Continue to avoid anti-inflammatory medications -Recommend continue Nexium for at least a couple weeks and then if symptoms resolved taper off to Pepcid or Zantac -Follow-up immediately for any exertion related symptoms or other concerns  Eulas Post MD [facility] at [facility]

## 2017-12-13 NOTE — Patient Instructions (Signed)
Food Choices for Gastroesophageal Reflux Disease, Adult When you have gastroesophageal reflux disease (GERD), the foods you eat and your eating habits are very important. Choosing the right foods can help ease the discomfort of GERD. Consider working with a diet and nutrition specialist (dietitian) to help you make healthy food choices. What general guidelines should I follow? Eating plan  Choose healthy foods low in fat, such as fruits, vegetables, whole grains, low-fat dairy products, and lean meat, fish, and poultry.  Eat frequent, small meals instead of three large meals each day. Eat your meals slowly, in a relaxed setting. Avoid bending over or lying down until 2-3 hours after eating.  Limit high-fat foods such as fatty meats or fried foods.  Limit your intake of oils, butter, and shortening to less than 8 teaspoons each day.  Avoid the following: ? Foods that cause symptoms. These may be different for different people. Keep a food diary to keep track of foods that cause symptoms. ? Alcohol. ? Drinking large amounts of liquid with meals. ? Eating meals during the 2-3 hours before bed.  Cook foods using methods other than frying. This may include baking, grilling, or broiling. Lifestyle   Maintain a healthy weight. Ask your health care provider what weight is healthy for you. If you need to lose weight, work with your health care provider to do so safely.  Exercise for at least 30 minutes on 5 or more days each week, or as told by your health care provider.  Avoid wearing clothes that fit tightly around your waist and chest.  Do not use any products that contain nicotine or tobacco, such as cigarettes and e-cigarettes. If you need help quitting, ask your health care provider.  Sleep with the head of your bed raised. Use a wedge under the mattress or blocks under the bed frame to raise the head of the bed. What foods are not recommended? The items listed may not be a complete  list. Talk with your dietitian about what dietary choices are best for you. Grains Pastries or quick breads with added fat. French toast. Vegetables Deep fried vegetables. French fries. Any vegetables prepared with added fat. Any vegetables that cause symptoms. For some people this may include tomatoes and tomato products, chili peppers, onions and garlic, and horseradish. Fruits Any fruits prepared with added fat. Any fruits that cause symptoms. For some people this may include citrus fruits, such as oranges, grapefruit, pineapple, and lemons. Meats and other protein foods High-fat meats, such as fatty beef or pork, hot dogs, ribs, ham, sausage, salami and bacon. Fried meat or protein, including fried fish and fried chicken. Nuts and nut butters. Dairy Whole milk and chocolate milk. Sour cream. Cream. Ice cream. Cream cheese. Milk shakes. Beverages Coffee and tea, with or without caffeine. Carbonated beverages. Sodas. Energy drinks. Fruit juice made with acidic fruits (such as orange or grapefruit). Tomato juice. Alcoholic drinks. Fats and oils Butter. Margarine. Shortening. Ghee. Sweets and desserts Chocolate and cocoa. Donuts. Seasoning and other foods Pepper. Peppermint and spearmint. Any condiments, herbs, or seasonings that cause symptoms. For some people, this may include curry, hot sauce, or vinegar-based salad dressings. Summary  When you have gastroesophageal reflux disease (GERD), food and lifestyle choices are very important to help ease the discomfort of GERD.  Eat frequent, small meals instead of three large meals each day. Eat your meals slowly, in a relaxed setting. Avoid bending over or lying down until 2-3 hours after eating.  Limit high-fat   foods such as fatty meat or fried foods. This information is not intended to replace advice given to you by your health care provider. Make sure you discuss any questions you have with your health care provider. Document Released:  09/14/2005 Document Revised: 09/15/2016 Document Reviewed: 09/15/2016 Elsevier Interactive Patient Education  2018 [place] for at least another 2 weeks. If symptoms stable at that time, consider transitioning to Zantac or Pepcid.

## 2018-02-16 ENCOUNTER — Encounter: Payer: Self-pay | Admitting: Family Medicine

## 2018-02-16 ENCOUNTER — Ambulatory Visit: Payer: BLUE CROSS/BLUE SHIELD | Admitting: Family Medicine

## 2018-02-16 VITALS — BP 120/70 | HR 94 | Temp 98.1°F | Wt 190.2 lb

## 2018-02-16 DIAGNOSIS — R0781 Pleurodynia: Secondary | ICD-10-CM | POA: Diagnosis not present

## 2018-02-16 NOTE — Patient Instructions (Signed)
Continue with Aleve twice daily as needed  Try some icing 15-20 minutes 2-3 times daily

## 2018-02-16 NOTE — Progress Notes (Signed)
  Subjective:     Patient ID: Adam Villanueva, male   DOB: 1951/04/16, 67 y.o.   MRN: [id number]  HPI Acute visit for left lower anterior rib pain. Onset yesterday. Over the weekend did a lot of climbing on ladders and on some scaffolding but does not recall specific injury. He has soreness to touch. No rash. No pleuritic pain. No dyspnea. Pain is moderate. He took some Aleve with mild relief. Denies any neck pain.  Past Medical History:  Diagnosis Date  . Allergy   . Anxiety   . Arthritis   . Cataract   . Chronic insomnia   . GERD (gastroesophageal reflux disease)   . Hyperlipidemia   . Hypertension   . Lower back pain    and mid back pain  . Prostatitis   . Sleep apnea    no cpap   Past Surgical History:  Procedure Laterality Date  . COLONOSCOPY    . POLYPECTOMY    . SEPTOPLASTY     broken nose  . WISDOM TOOTH EXTRACTION      reports that he has never smoked. He has never used smokeless tobacco. He reports that he drinks alcohol. He reports that he does not use drugs. family history includes Arthritis in his father, maternal grandfather, maternal grandmother, mother, paternal grandfather, and paternal grandmother; Cancer in his mother; Hyperlipidemia in his mother; Hypertension in his father and mother; Rectal cancer in his mother; Throat cancer in his brother. No Known Allergies   Review of Systems  Constitutional: Negative for chills and fever.  Respiratory: Negative for cough, chest tightness and shortness of breath.   Cardiovascular: Negative for chest pain.  Skin: Negative for rash.       Objective:   Physical Exam  Constitutional: He appears well-developed and well-nourished.  Cardiovascular: Normal rate and regular rhythm.  Pulmonary/Chest: Effort normal and breath sounds normal. No respiratory distress.  Has some tenderness over his left anterior lower rib cage area but no erythema. No visible swelling.  Abdominal: Soft. He exhibits no mass. There is no  tenderness. There is no rebound and no guarding.  Skin: No rash noted.       Assessment:     Left anterior rib pain. No specific injury. Nonfocal exam.. possibly related to overuse from recent work    Plan:     -Recommend he try some icing 15-20 minutes 2-3 times daily -Continue Aleve twice daily as needed -Touch base if pain not improving over the next few weeks  Eulas Post MD [facility] at [facility]

## 2018-03-07 ENCOUNTER — Encounter: Payer: BLUE CROSS/BLUE SHIELD | Admitting: Family Medicine

## 2018-03-28 ENCOUNTER — Encounter: Payer: Self-pay | Admitting: Family Medicine

## 2018-03-28 ENCOUNTER — Ambulatory Visit (INDEPENDENT_AMBULATORY_CARE_PROVIDER_SITE_OTHER): Payer: Medicare Other | Admitting: Family Medicine

## 2018-03-28 ENCOUNTER — Telehealth: Payer: Self-pay | Admitting: Family Medicine

## 2018-03-28 VITALS — BP 110/78 | HR 73 | Temp 98.2°F | Ht 70.0 in | Wt 185.3 lb

## 2018-03-28 DIAGNOSIS — E785 Hyperlipidemia, unspecified: Secondary | ICD-10-CM | POA: Diagnosis not present

## 2018-03-28 DIAGNOSIS — K219 Gastro-esophageal reflux disease without esophagitis: Secondary | ICD-10-CM | POA: Diagnosis not present

## 2018-03-28 DIAGNOSIS — Z0000 Encounter for general adult medical examination without abnormal findings: Secondary | ICD-10-CM | POA: Diagnosis not present

## 2018-03-28 DIAGNOSIS — I10 Essential (primary) hypertension: Secondary | ICD-10-CM

## 2018-03-28 DIAGNOSIS — N401 Enlarged prostate with lower urinary tract symptoms: Secondary | ICD-10-CM

## 2018-03-28 LAB — BASIC METABOLIC PANEL
BUN: 13 mg/dL (ref 6–23)
CO2: 29 mEq/L (ref 19–32)
Calcium: 9.2 mg/dL (ref 8.4–10.5)
Chloride: 99 mEq/L (ref 96–112)
Creatinine, Ser: 1.03 mg/dL (ref 0.40–1.50)
GFR: 77.04 mL/min (ref >60.00)
Glucose, Bld: 98 mg/dL (ref 70–99)
POTASSIUM: 4.4 meq/L (ref 3.5–5.1)
Sodium: 137 mEq/L (ref 135–145)

## 2018-03-28 LAB — HEPATIC FUNCTION PANEL
ALK PHOS: 66 U/L (ref 39–117)
ALT: 21 U/L (ref 0–53)
AST: 21 U/L (ref 0–37)
Albumin: 4.4 g/dL (ref 3.5–5.2)
BILIRUBIN TOTAL: 0.6 mg/dL (ref 0.2–1.2)
Bilirubin, Direct: 0.1 mg/dL (ref 0.0–0.3)
Total Protein: 6.7 g/dL (ref 6.0–8.3)

## 2018-03-28 LAB — POCT URINALYSIS DIPSTICK
BILIRUBIN UA: NEGATIVE
Blood, UA: NEGATIVE
Glucose, UA: NEGATIVE
KETONES UA: NEGATIVE
Leukocytes, UA: NEGATIVE
NITRITE UA: NEGATIVE
PROTEIN UA: NEGATIVE
SPEC GRAV UA: 1.01 (ref 1.010–1.025)
UROBILINOGEN UA: 0.2 U/dL
pH, UA: 7 (ref 5.0–8.0)

## 2018-03-28 LAB — LIPID PANEL
CHOL/HDL RATIO: 6
CHOLESTEROL: 187 mg/dL (ref 0–200)
HDL: 32.1 mg/dL — ABNORMAL LOW (ref >39.00)
Triglycerides: 585 mg/dL — ABNORMAL HIGH (ref 0.0–149.0)

## 2018-03-28 LAB — LDL CHOLESTEROL, DIRECT: Direct LDL: 74 mg/dL

## 2018-03-28 LAB — PSA: PSA: 3.61 ng/mL (ref 0.10–4.00)

## 2018-03-28 MED ORDER — LISINOPRIL 40 MG PO TABS: 40.0000 mg | ORAL_TABLET | Freq: Every day | ORAL | 3 refills | Status: DC

## 2018-03-28 MED ORDER — METOPROLOL SUCCINATE ER 50 MG PO TB24: 50.0000 mg | ORAL_TABLET | Freq: Every day | ORAL | 3 refills | Status: DC

## 2018-03-28 NOTE — Progress Notes (Signed)
Subjective:     Patient ID: Adam Villanueva, male   DOB: Aug 31, 1951, 67 y.o.   MRN: [id number]  HPI   He is here today for Welcome to Medicare exam and for medical follow-up. His chronic problems include history of hypertension, hyperlipidemia, GERD. Medications reviewed. Compliant with all. He is retired. He is enjoying retirement. He is walking for exercise. Also started attending yoga class. He feels his flexibility is improving.  He does describe some slow urinary stream intermittently. Occasional nocturia. No burning with urination. Occasional cloudy urine.  Chronic issues as above.  Medications reviewed and compliant with all.  No active GERD symptoms.  No chest pain.  No dizziness.  1.  Risk factors based on Past Medical , Social, and Family history reviewed and as indicated above with no changes 2.  Limitations in physical activities None.  No recent falls.  Exercise with walking and yoga. 3.  Depression/mood No active depression or anxiety issues  PHQ2=0 4.  Hearing No defiits 5.  ADLs independent in all. 6.  Cognitive function (orientation to time and place, language, writing, speech,memory) no short or long term memory issues.  Language and judgement intact. 7.  Home Safety no issues 8.  Height, weight, and visual acuity.all stable. Has early cataracts. He is followed regularly by ophthalmology 9.  Counseling discussed -importance of continued exercise to help with fall prevention 10. Recommendation of preventive services. Continue yearly flu vaccine. Recommend Prevnar 13 after turns 67 which will be in several days 11. Labs based on risk factors-lipid, hepatic, basic metabolic panel, PSA 12. Care Plan-as above 13. Other Providers-none 14. Written schedule of screening/prevention services given to patient. 15. Advanced directives-has living will and also medical POA.  Past Medical History:  Diagnosis Date  . Allergy   . Anxiety   . Arthritis   . Cataract   . Chronic  insomnia   . GERD (gastroesophageal reflux disease)   . Hyperlipidemia   . Hypertension   . Lower back pain    and mid back pain  . Prostatitis   . Sleep apnea    no cpap   Past Surgical History:  Procedure Laterality Date  . COLONOSCOPY    . POLYPECTOMY    . SEPTOPLASTY     broken nose  . WISDOM TOOTH EXTRACTION      reports that he has never smoked. He has never used smokeless tobacco. He reports that he drinks alcohol. He reports that he does not use drugs. family history includes Arthritis in his father, maternal grandfather, maternal grandmother, mother, paternal grandfather, and paternal grandmother; Cancer in his mother; Hyperlipidemia in his mother; Hypertension in his father and mother; Rectal cancer in his mother; Throat cancer in his brother. No Known Allergies   Review of Systems  Constitutional: Negative for activity change, appetite change, fatigue and fever.  HENT: Negative for congestion, ear pain and trouble swallowing.   Eyes: Negative for pain and visual disturbance.  Respiratory: Negative for cough, shortness of breath and wheezing.   Cardiovascular: Negative for chest pain and palpitations.  Gastrointestinal: Negative for abdominal distention, abdominal pain, blood in stool, constipation, diarrhea, nausea, rectal pain and vomiting.  Genitourinary: Negative for dysuria, hematuria and testicular pain.  Musculoskeletal: Negative for arthralgias and joint swelling.  Skin: Negative for rash.  Neurological: Negative for dizziness, syncope and headaches.  Hematological: Negative for adenopathy.  Psychiatric/Behavioral: Negative for confusion and dysphoric mood.       Objective:   Physical Exam  Constitutional:  He is oriented to person, place, and time. He appears well-developed and well-nourished. No distress.  HENT:  Head: Normocephalic and atraumatic.  Right Ear: External ear normal.  Left Ear: External ear normal.  Mouth/Throat: Oropharynx is clear and  moist.  Eyes: Pupils are equal, round, and reactive to light. Conjunctivae and EOM are normal.  Neck: Normal range of motion. Neck supple. No thyromegaly present.  Cardiovascular: Normal rate, regular rhythm and normal heart sounds.  No murmur heard. Pulmonary/Chest: No respiratory distress. He has no wheezes. He has no rales.  Abdominal: Soft. Bowel sounds are normal. He exhibits no distension and no mass. There is no tenderness. There is no rebound and no guarding.  Musculoskeletal: He exhibits no edema.  Lymphadenopathy:    He has no cervical adenopathy.  Neurological: He is alert and oriented to person, place, and time. He displays normal reflexes. No cranial nerve deficit.  Skin: No rash noted.  Psychiatric: He has a normal mood and affect.       Assessment:     #1 introduction to Medicare wellness visit  #2 hypertension stable and at goal  #3 dyslipidemia  #4 GERD with history of dysphagia currently stable    Plan:     -Reminder for yearly flu vaccine -Return for Prevnar 13 once he turns 67 -Pneumovax in 1 year -Discuss new shingles vaccine. He will wait until next year -Obtain labs including lipid panel (hyperlipidemia), hepatic panel, basic metabolic panel (hypertension), PSA -Continue with regular exercise habits -Continue yearly follow-up with ophthalmology  Eulas Post MD [facility]

## 2018-03-28 NOTE — Telephone Encounter (Signed)
Lisinopril and Metoprolol were both sent for 90-day supply.   Still Needs:  pantoprazole (PROTONIX) 40 MG tablet rosuvastatin (CRESTOR) 10 MG tablet sildenafil (REVATIO) 20 MG tablet  [facility] - [place], [place]

## 2018-03-28 NOTE — Patient Instructions (Signed)
Remember Flu vaccine in the Fall  Recommend getting Prevnar 13 then as well and pneumovax in about one year   Continue with regular exercise habits.  We will call you with labs done today  Consider new shingles vaccine in 1-2 years.

## 2018-03-28 NOTE — Telephone Encounter (Signed)
Copied from [place] [id number]. Topic: Quick Communication - See Telephone Encounter >> Mar 28, 2018 10:20 AM Ahmed Prima L wrote: CRM for notification. See Telephone encounter for: 03/28/18.  Patient was in the office today for his physical and he said that he just wanted to make sure Dr Elease Hashimoto knew that he needed new prescriptions 90 day supply  lisinopril (PRINIVIL,ZESTRIL) 40 MG tablet metoprolol succinate (TOPROL-XL) 50 MG 24 hr tablet pantoprazole (PROTONIX) 40 MG tablet rosuvastatin (CRESTOR) 10 MG tablet sildenafil (REVATIO) 20 MG tablet  [facility] - [place], [place]

## 2018-03-29 MED ORDER — ROSUVASTATIN CALCIUM 10 MG PO TABS: 10.0000 mg | ORAL_TABLET | ORAL | 3 refills | Status: DC

## 2018-03-29 MED ORDER — PANTOPRAZOLE SODIUM 40 MG PO TBEC: 40.0000 mg | DELAYED_RELEASE_TABLET | Freq: Every day | ORAL | 3 refills | Status: DC

## 2018-03-29 MED ORDER — SILDENAFIL CITRATE 20 MG PO TABS
ORAL_TABLET | ORAL | 5 refills | Status: DC
Start: 2018-03-29 — End: 2022-01-29

## 2018-07-06 ENCOUNTER — Encounter: Payer: Self-pay | Admitting: Family Medicine

## 2018-07-06 ENCOUNTER — Ambulatory Visit (INDEPENDENT_AMBULATORY_CARE_PROVIDER_SITE_OTHER): Payer: Medicare Other | Admitting: Family Medicine

## 2018-07-06 VITALS — BP 122/70 | HR 89 | Temp 98.6°F | Resp 12 | Ht 70.0 in | Wt 191.5 lb

## 2018-07-06 DIAGNOSIS — R51 Headache: Secondary | ICD-10-CM

## 2018-07-06 DIAGNOSIS — R519 Headache, unspecified: Secondary | ICD-10-CM

## 2018-07-06 DIAGNOSIS — J069 Acute upper respiratory infection, unspecified: Secondary | ICD-10-CM | POA: Diagnosis not present

## 2018-07-06 DIAGNOSIS — R05 Cough: Secondary | ICD-10-CM

## 2018-07-06 DIAGNOSIS — R059 Cough, unspecified: Secondary | ICD-10-CM

## 2018-07-06 LAB — POCT INFLUENZA A/B: Influenza A, POC: NEGATIVE

## 2018-07-06 MED ORDER — FLUTICASONE PROPIONATE 50 MCG/ACT NA SUSP: 1.0000 | Freq: Two times a day (BID) | NASAL | 0 refills | Status: DC

## 2018-07-06 MED ORDER — BENZONATATE 100 MG PO CAPS
200.0000 mg | ORAL_CAPSULE | Freq: Two times a day (BID) | ORAL | 0 refills | Status: AC | PRN
Start: 2018-07-06 — End: 2018-07-16

## 2018-07-06 MED ORDER — HYDROCODONE-HOMATROPINE 5-1.5 MG/5ML PO SYRP: 5.0000 mL | ORAL_SOLUTION | Freq: Two times a day (BID) | ORAL | 0 refills | Status: AC | PRN

## 2018-07-06 NOTE — Progress Notes (Signed)
ACUTE VISIT  HPI:  Chief Complaint  Patient presents with  . Headache    sx started 3 days ago  . Cough  . Nasal Congestion    Mr.Adam Villanueva is a 67 y.o.male here today complaining of 3 days of respiratory symptoms. Parietal dull headache, which is better today,mild.  He has not identified exacerbating or alleviating factors.  No associated nausea, visual changes, or vomiting.  Nonproductive cough, it is causing chest wall tenderness. No associated dyspnea, wheezing, palpitations, or diaphoresis. He has not noted fever but has had some chills.    He has had also some sore throat but it has improved.    URI   This is a new problem. The current episode started in the past 7 days. The problem has been gradually improving. There has been no fever. Associated symptoms include congestion, coughing, ear pain, headaches, a plugged ear sensation, rhinorrhea and a sore throat. Pertinent negatives include no abdominal pain, diarrhea, nausea, neck pain, rash, sinus pain, sneezing, swollen glands, vomiting or wheezing. He has tried acetaminophen for the symptoms. The treatment provided mild relief.     No Hx of recent travel. No sick contact. No known insect bite.  Hx of allergies: Not known.  OTC medications for this problem: Benadryl and cold medications.   Review of Systems  Constitutional: Positive for activity change, appetite change, chills and fatigue. Negative for fever.  HENT: Positive for congestion, ear pain, postnasal drip, rhinorrhea and sore throat. Negative for mouth sores, sinus pain, sneezing, trouble swallowing and voice change.   Eyes: Negative for discharge, redness and itching.  Respiratory: Positive for cough. Negative for chest tightness, shortness of breath and wheezing.   Cardiovascular: Negative for leg swelling.  Gastrointestinal: Negative for abdominal pain, diarrhea, nausea and vomiting.  Musculoskeletal: Positive for myalgias. Negative  for gait problem and neck pain.  Skin: Negative for rash.  Neurological: Positive for headaches. Negative for weakness.      Current Outpatient Medications on File Prior to Visit  Medication Sig Dispense Refill  . cholecalciferol (VITAMIN D) 1000 units tablet Take 1,000 Units by mouth daily.    Marland Kitchen esomeprazole (NEXIUM) 20 MG capsule 20 mg. Uses only PRN for flare ups.    Marland Kitchen ibuprofen (ADVIL,MOTRIN) 200 MG tablet Take 400 mg by mouth every 6 (six) hours as needed (pain).    Marland Kitchen lisinopril (PRINIVIL,ZESTRIL) 40 MG tablet Take 1 tablet (40 mg total) by mouth daily. 90 tablet 3  . Magnesium Citrate 100 MG TABS Take 500 mg by mouth daily.     . metoprolol succinate (TOPROL-XL) 50 MG 24 hr tablet Take 1 tablet (50 mg total) by mouth daily. Take with or immediately following a meal. 90 tablet 3  . Multiple Vitamin (MULTIVITAMIN) tablet Take 1 tablet by mouth daily.    . Omega-3 Fatty Acids (OMEGA-3 FISH OIL PO) Take by mouth.    Marland Kitchen OVER THE COUNTER MEDICATION Simplex M - sleep aid    . pantoprazole (PROTONIX) 40 MG tablet Take 1 tablet (40 mg total) by mouth daily. 90 tablet 3  . rosuvastatin (CRESTOR) 10 MG tablet Take 1 tablet (10 mg total) by mouth every other day. 90 tablet 3  . sildenafil (REVATIO) 20 MG tablet Take 2 to 5 tablets one hour prior to sexual activity. 50 tablet 5  . vitamin C (ASCORBIC ACID) 500 MG tablet Take 500 mg by mouth daily.     Current Facility-Administered Medications on File  Prior to Visit  Medication Dose Route Frequency Provider Last Rate Last Dose  . 0.9 %  sodium chloride infusion  500 mL Intravenous Continuous Irene Shipper, MD         Past Medical History:  Diagnosis Date  . Allergy   . Anxiety   . Arthritis   . Cataract   . Chronic insomnia   . GERD (gastroesophageal reflux disease)   . Hyperlipidemia   . Hypertension   . Lower back pain    and mid back pain  . Prostatitis   . Sleep apnea    no cpap   No Known Allergies  Social History    Socioeconomic History  . Marital status: Divorced    Spouse name: Not on file  . Number of children: Not on file  . Years of education: Not on file  . Highest education level: Not on file  Occupational History  . Not on file  Social Needs  . Financial resource strain: Not on file  . Food insecurity:    Worry: Not on file    Inability: Not on file  . Transportation needs:    Medical: Not on file    Non-medical: Not on file  Tobacco Use  . Smoking status: Never Smoker  . Smokeless tobacco: Never Used  Substance and Sexual Activity  . Alcohol use: Yes    Alcohol/week: 0.0 standard drinks    Comment: occ beer  . Drug use: No  . Sexual activity: Not on file  Lifestyle  . Physical activity:    Days per week: Not on file    Minutes per session: Not on file  . Stress: Not on file  Relationships  . Social connections:    Talks on phone: Not on file    Gets together: Not on file    Attends religious service: Not on file    Active member of club or organization: Not on file    Attends meetings of clubs or organizations: Not on file    Relationship status: Not on file  Other Topics Concern  . Not on file  Social History Narrative  . Not on file    Vitals:   07/06/18 1150  BP: 122/70  Pulse: 89  Resp: 12  Temp: 98.6 F (37 C)  SpO2: 97%   Body mass index is 27.48 kg/m.   Physical Exam  Nursing note and vitals reviewed. Constitutional: He is oriented to person, place, and time. He appears well-developed and well-nourished. He does not appear ill. No distress.  HENT:  Head: Normocephalic and atraumatic.  Right Ear: Tympanic membrane, external ear and ear canal normal.  Left Ear: Tympanic membrane, external ear and ear canal normal.  Nose: Rhinorrhea present. Right sinus exhibits no maxillary sinus tenderness and no frontal sinus tenderness. Left sinus exhibits no maxillary sinus tenderness and no frontal sinus tenderness.  Mouth/Throat: Uvula is midline and  mucous membranes are normal. Posterior oropharyngeal erythema present. No oropharyngeal exudate or posterior oropharyngeal edema.  Postnasal drainage. Nasal voice.  Eyes: Conjunctivae and EOM are normal.  Cardiovascular: Normal rate and regular rhythm.  No murmur heard. Respiratory: Effort normal and breath sounds normal. No stridor. No respiratory distress.  Lymphadenopathy:       Head (right side): No submandibular adenopathy present.       Head (left side): No submandibular adenopathy present.    He has cervical adenopathy.       Right cervical: Posterior cervical adenopathy present.  Left cervical: Posterior cervical adenopathy present.  Neurological: He is alert and oriented to person, place, and time. He has normal strength. No cranial nerve deficit. Gait normal.  Skin: Skin is warm. No rash noted. No erythema.  Psychiatric: He has a normal mood and affect. His speech is normal.  Well groomed, good eye contact.    ASSESSMENT AND PLAN:   [name] was seen today for headache, cough and nasal congestion.  Diagnoses and all orders for this visit:  URI, acute  Symptoms and clinical findings suggest a viral etiology, so symptomatic treatment recommended. Plenty of p.o. fluids, honey, and rest recommended. Rapid flu test negative. Instructed about warning signs. Follow-up as needed with PCP.  -     POC Influenza A/B -     fluticasone (FLONASE) 50 MCG/ACT nasal spray; Place 1 spray into both nostrils 2 (two) times daily.  Headache, unspecified headache type  Improved. Most likely related to viral illness. Clearly instructed about warning signs.  -     POC Influenza A/B  Cough  Explained that cough and congestion can last a few days and sometimes weeks. We discussed some side effects of Hycodan. Adequate fluid intake. I do not think imaging is needed today.  -     POC Influenza A/B -     benzonatate (TESSALON) 100 MG capsule; Take 2 capsules (200 mg total) by  mouth 2 (two) times daily as needed for up to 10 days. -     HYDROcodone-homatropine (HYCODAN) 5-1.5 MG/5ML syrup; Take 5 mLs by mouth every 12 (twelve) hours as needed for up to 10 days for cough.         Betty G. Martinique, MD  [facility]. [place] office.

## 2018-07-06 NOTE — Patient Instructions (Addendum)
A few things to remember from today's visit:   Headache, unspecified headache type - Plan: POC Influenza A/B  Cough - Plan: POC Influenza A/B  URI, acute - Plan: POC Influenza A/B  viral infections are self-limited and we treat each symptom depending of severity.  Over the counter medications as decongestants and cold medications usually help, they need to be taken with caution if there is a history of high blood pressure or palpitations.  You need to be cautious because your history of hypertension.  Tylenol and/or Ibuprofen also helps with most symptoms (headache, muscle aching, fever,etc) Plenty of fluids. Honey helps with cough. Steam inhalations helps with runny nose, nasal congestion, and may prevent sinus infections. Cough and nasal congestion could last a few days and sometimes weeks. Please follow in not any better in 1-2 weeks or if symptoms get worse.   Please be sure medication list is accurate. If a new problem present, please set up appointment sooner than planned today.

## 2018-07-29 ENCOUNTER — Ambulatory Visit (INDEPENDENT_AMBULATORY_CARE_PROVIDER_SITE_OTHER): Payer: Medicare Other

## 2018-07-29 DIAGNOSIS — Z23 Encounter for immunization: Secondary | ICD-10-CM

## 2018-10-04 ENCOUNTER — Encounter

## 2018-10-04 ENCOUNTER — Other Ambulatory Visit: Payer: Self-pay

## 2018-10-04 ENCOUNTER — Ambulatory Visit (INDEPENDENT_AMBULATORY_CARE_PROVIDER_SITE_OTHER): Payer: Medicare Other | Admitting: Family Medicine

## 2018-10-04 ENCOUNTER — Encounter: Payer: Self-pay | Admitting: Family Medicine

## 2018-10-04 VITALS — BP 110/70 | HR 75 | Temp 97.6°F | Ht 70.0 in | Wt 190.3 lb

## 2018-10-04 DIAGNOSIS — R197 Diarrhea, unspecified: Secondary | ICD-10-CM | POA: Diagnosis not present

## 2018-10-04 DIAGNOSIS — K219 Gastro-esophageal reflux disease without esophagitis: Secondary | ICD-10-CM

## 2018-10-04 DIAGNOSIS — I10 Essential (primary) hypertension: Secondary | ICD-10-CM | POA: Diagnosis not present

## 2018-10-04 DIAGNOSIS — E785 Hyperlipidemia, unspecified: Secondary | ICD-10-CM | POA: Diagnosis not present

## 2018-10-04 MED ORDER — METOPROLOL SUCCINATE ER 50 MG PO TB24: 50.0000 mg | ORAL_TABLET | Freq: Every day | ORAL | 3 refills | Status: DC

## 2018-10-04 MED ORDER — PANTOPRAZOLE SODIUM 40 MG PO TBEC: 40.0000 mg | DELAYED_RELEASE_TABLET | Freq: Every day | ORAL | 3 refills | Status: DC

## 2018-10-04 MED ORDER — LISINOPRIL 40 MG PO TABS: 40.0000 mg | ORAL_TABLET | Freq: Every day | ORAL | 3 refills | Status: DC

## 2018-10-04 MED ORDER — ROSUVASTATIN CALCIUM 10 MG PO TABS: 10.0000 mg | ORAL_TABLET | ORAL | 3 refills | Status: DC

## 2018-10-04 NOTE — Progress Notes (Signed)
Subjective:     Patient ID: Adam Villanueva, male   DOB: 23-May-1951, 68 y.o.   MRN: [id number]  HPI Patient here to discuss the following issues  He states ever since his colonoscopy April 2018 he has had some intermittent loose stools.  He will have several days or even weeks without loose stools and then sometimes up to up to 2-3 a day.  He cannot relate any specific triggers.  No recent travels.  No antibiotic use.  He does take chronic daily PPI.  No bloody stools.  No appetite or weight changes.  No abdominal pain or abdominal cramps.  He has hypertension which is treated with lisinopril and metoprolol.  Requesting refills of medication.  Also needs refills of Crestor and Protonix.  His reflux has been well controlled recently.  No headaches or dizziness.  No recent chest pains.  Compliant with medications.  He has questions regarding shingles vaccine.  He had previous Zostavax.  Is not sure of insurance coverage for Shingrix.  Were checking lipids and hepatic panel back in July  Past Medical History:  Diagnosis Date  . Allergy   . Anxiety   . Arthritis   . Cataract   . Chronic insomnia   . GERD (gastroesophageal reflux disease)   . Hyperlipidemia   . Hypertension   . Lower back pain    and mid back pain  . Prostatitis   . Sleep apnea    no cpap   Past Surgical History:  Procedure Laterality Date  . COLONOSCOPY    . POLYPECTOMY    . SEPTOPLASTY     broken nose  . WISDOM TOOTH EXTRACTION      reports that he has never smoked. He has never used smokeless tobacco. He reports current alcohol use. He reports that he does not use drugs. family history includes Arthritis in his father, maternal grandfather, maternal grandmother, mother, paternal grandfather, and paternal grandmother; Cancer in his mother; Hyperlipidemia in his mother; Hypertension in his father and mother; Rectal cancer in his mother; Throat cancer in his brother. No Known Allergies   Review of Systems   Constitutional: Negative for fatigue.  HENT: Negative for trouble swallowing.   Eyes: Negative for visual disturbance.  Respiratory: Negative for cough, chest tightness and shortness of breath.   Cardiovascular: Negative for chest pain, palpitations and leg swelling.  Gastrointestinal: Positive for diarrhea. Negative for abdominal distention, abdominal pain, blood in stool, nausea and vomiting.  Neurological: Negative for dizziness, syncope, weakness, light-headedness and headaches.       Objective:   Physical Exam Constitutional:      Appearance: He is well-developed.  HENT:     Right Ear: External ear normal.     Left Ear: External ear normal.  Eyes:     Pupils: Pupils are equal, round, and reactive to light.  Neck:     Musculoskeletal: Neck supple.     Thyroid: No thyromegaly.  Cardiovascular:     Rate and Rhythm: Normal rate and regular rhythm.  Pulmonary:     Effort: Pulmonary effort is normal. No respiratory distress.     Breath sounds: Normal breath sounds. No wheezing or rales.  Abdominal:     General: Bowel sounds are normal. There is no distension.     Palpations: Abdomen is soft. There is no mass.     Tenderness: There is no abdominal tenderness. There is no guarding or rebound.  Neurological:     Mental Status: He is alert and  oriented to person, place, and time.        Assessment:     #1 hypertension stable and at goal  #2 hyperlipidemia treated with Crestor  #3 frequent loose stools.  Question related to PPI.  Symptoms are very intermittent.  No clear triggers.  No red flags such as bloody stools, abdominal pain, appetite or weight changes    Plan:     -Refilled blood pressure medications and Crestor for 1 year -We suggested trial off PPI and try Pepcid 20 mg twice daily see if loose stools resolved with this.  Touch base if not improving over the next couple weeks -He will check on insurance coverage for shingles vaccine -Routine follow-up in 6  months and obtain follow-up labs then  Eulas Post MD [facility]

## 2018-10-04 NOTE — Patient Instructions (Signed)
Consider holding the Protonix for 1-2 weeks and going on Pepcid 20 mg twice daily- to see if loose stools improve.

## 2018-11-15 ENCOUNTER — Ambulatory Visit: Payer: Medicare Other | Admitting: Internal Medicine

## 2018-11-15 ENCOUNTER — Other Ambulatory Visit (INDEPENDENT_AMBULATORY_CARE_PROVIDER_SITE_OTHER): Payer: Medicare Other

## 2018-11-15 ENCOUNTER — Encounter: Payer: Self-pay | Admitting: Internal Medicine

## 2018-11-15 VITALS — BP 106/80 | HR 72 | Ht 69.0 in | Wt 189.1 lb

## 2018-11-15 DIAGNOSIS — R194 Change in bowel habit: Secondary | ICD-10-CM

## 2018-11-15 DIAGNOSIS — R197 Diarrhea, unspecified: Secondary | ICD-10-CM | POA: Diagnosis not present

## 2018-11-15 LAB — IGA: IgA: 162 mg/dL (ref 68–378)

## 2018-11-15 MED ORDER — METRONIDAZOLE 250 MG PO TABS: 250.0000 mg | ORAL_TABLET | Freq: Three times a day (TID) | ORAL | 0 refills | Status: DC

## 2018-11-15 NOTE — Progress Notes (Signed)
HISTORY OF PRESENT ILLNESS:  Adam Villanueva is a 68 y.o. male who presents today regarding 2-year history of change in bowel habits as manifested by more frequent bowel movements.  Patient states that he typically would have 1 formed bowel movement per day until approximately 2 years ago when at some point after having had his colonoscopy January 04, 2017 developed increased frequency of bowel movements.  The colonoscopy revealed a 6 mm sessile hyperplastic appearing polyp in the cecum which was removed as well as sigmoid diverticulosis and internal hemorrhoids.  Follow-up in 5 years recommended given the size and location of the polyp.  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  Patient's current bowel habits are 2 or 3 pasty bowel movements occasionally loose.  May occur in the morning or afternoon.  No nocturnal component.  There is bloating but no abdominal pain.  No bleeding.  He does occasionally use artificial sweeteners.  He uses magnesium on a daily basis as a supplement.  No change in diet.  No weight loss.  He was seen by his primary care provider October 04, 2018 (encounter reviewed).  Review of outside blood work from July 2019 reveals normal comprehensive metabolic panel.  Review of outside x-ray file from May 2015 includes a CT scan of the abdomen and pelvis without contrast.  No acute abnormalities.  Patient does have chronic GERD for which she requires PPI for symptom control  REVIEW OF SYSTEMS:  All non-GI ROS negative unless otherwise stated in the HPI except for anxiety, arthritis, back pain, heart rhythm change, visual change, sleeping problems  Past Medical History:  Diagnosis Date  . Allergy   . Anxiety   . Arthritis   . Cataract   . Chronic insomnia   . GERD (gastroesophageal reflux disease)   . Hyperlipidemia   . Hypertension   . Lower back pain    and mid back pain  . Prostatitis    . Sleep apnea    no cpap    Past Surgical History:  Procedure Laterality Date  . COLONOSCOPY    . POLYPECTOMY    . SEPTOPLASTY     broken nose  . WISDOM TOOTH EXTRACTION      Social History Adam Villanueva  reports that he has never smoked. He has never used smokeless tobacco. He reports current alcohol use. He reports that he does not use drugs.  family history includes Arthritis in his father, maternal grandfather, maternal grandmother, mother, paternal grandfather, and paternal grandmother; Cancer in his mother; Hyperlipidemia in his mother; Hypertension in his father and mother; Rectal cancer in his mother; Throat cancer in his brother.  No Known Allergies     PHYSICAL EXAMINATION: Vital signs: BP 106/80   Pulse 72   Ht 5\' 9"  (1.753 m)   Wt 189 lb 2 oz (85.8 kg)   BMI 27.93 kg/m   Constitutional: generally well-appearing, no acute distress Psychiatric: alert and oriented x3, cooperative Eyes: extraocular movements intact, anicteric, conjunctiva pink Mouth: oral pharynx moist, no lesions Neck: supple no lymphadenopathy Cardiovascular: heart regular rate and rhythm, no murmur Lungs: clear to auscultation bilaterally Abdomen: soft, nontender, nondistended, no obvious ascites, no peritoneal signs, normal bowel sounds, no organomegaly Rectal: Omitted Extremities: no, cyanosis, or lower extremity edema bilaterally Skin: no lesions on visible extremities Neuro: No focal deficits.  Cranial nerves intact  ASSESSMENT:  1.  2-year history of change in bowel habits as manifested by  increased frequency and less formed.  Also associated bloating.  Possible causes include celiac disease, bacterial overgrowth, or possibly lymphocytic colitis given the findings around the polypectomy specimen as described. 2.  Colonoscopy April 2018 with hyperplastic cecal polyp 3.  Chronic GERD.  Symptoms controlled on PPI  PLAN:  1.  Avoid artificial sweeteners and hold magnesium for now 2.   Initiate metronidazole 250 mg 3 times daily for 2 weeks for possible bacterial overgrowth.  Medication effects and side effects reviewed 3.  Serologies for celiac disease today.  We will contact patient with the results 4.  Contact the office in 1 month with follow-up.  If no improvement in symptoms, would consider a trial of budesonide 9 mg daily 5.  Follow-up either way in 3 months recommended 6.  Surveillance colonoscopy around April 2023

## 2018-11-15 NOTE — Patient Instructions (Signed)
  Your provider has requested that you go to the basement level for lab work before leaving today. Press "B" on the elevator. The lab is located at the first door on the left as you exit the elevator.  We have sent the following medications to your pharmacy for you to pick up at your convenience:  Metronidazole  Please call back in a month if you are not doing any better.  Otherwise, please follow up in three months

## 2018-11-16 LAB — TISSUE TRANSGLUTAMINASE, IGA: (tTG) Ab, IgA: 1 U/mL

## 2018-12-06 ENCOUNTER — Telehealth: Payer: Self-pay | Admitting: Internal Medicine

## 2018-12-06 MED ORDER — BUDESONIDE 3 MG PO CPEP: 9.0000 mg | ORAL_CAPSULE | Freq: Every day | ORAL | 0 refills | Status: DC

## 2018-12-06 MED ORDER — BUDESONIDE 3 MG PO CPEP: 9.0000 mg | ORAL_CAPSULE | Freq: Every day | ORAL | 3 refills | Status: DC

## 2018-12-06 NOTE — Telephone Encounter (Signed)
Pt stated that his symptoms have not improved with metronidazole.  Please advise.

## 2018-12-06 NOTE — Telephone Encounter (Signed)
Adam Villanueva says the prescription is too expensive and wonders if something more economical can be sent?

## 2018-12-06 NOTE — Telephone Encounter (Signed)
Pt states he can get the prescription cheaper at [facility] and would like the prescription sent there. Script sent as requested.

## 2018-12-06 NOTE — Telephone Encounter (Signed)
Henrene Pastor pt calling stating he has finished the flagyl he was given and he is still having diarrhea. States at first he was having only 1-2 diarrhea stools but the past 2 days he is having about 5. Please see attached note from OV with Dr. Henrene Pastor. Please advise as DOD.  Marland Kitchen  Avoid artificial sweeteners and hold magnesium for now 2.  Initiate metronidazole 250 mg 3 times daily for 2 weeks for possible bacterial overgrowth.  Medication effects and side effects reviewed 3.  Serologies for celiac disease today.  We will contact patient with the results 4.  Contact the office in 1 month with follow-up.  If no improvement in symptoms, would consider a trial of budesonide 9 mg daily 5.  Follow-up either way in 3 months recommended 6.  Surveillance colonoscopy around April 2023

## 2018-12-06 NOTE — Telephone Encounter (Signed)
Pt aware, script sent to pharmacy, pt scheduled to see Dr. Henrene Pastor 01/17/19@9am .

## 2018-12-06 NOTE — Addendum Note (Signed)
Addended by: Rosanne Sack R on: 12/06/2018 02:26 PM   Modules accepted: Orders

## 2018-12-06 NOTE — Telephone Encounter (Signed)
Adam Villanueva ,  I reviewed Dr. Blanch Media complete office note.  He was wondering about the possibility of lymphocytic colitis based on previous colon polyp biopsy findings, and was considering starting budesonide if this diarrhea persisted.  Please send a prescription for 3 mg budesonide (Entocort) capsules.  The dose is 9 mg (3 capsules) once daily. Disp#90, RF zero.  I do not know if it will be a long-term medicine, so please find out if the patient would rather have it sent to local pharmacy or mail order.  Please have the patient start this medication, and also arrange an appointment with Dr. Henrene Pastor next available.

## 2019-01-09 ENCOUNTER — Encounter: Payer: Self-pay | Admitting: *Deleted

## 2019-01-10 ENCOUNTER — Other Ambulatory Visit: Payer: Self-pay

## 2019-01-10 ENCOUNTER — Encounter: Payer: Self-pay | Admitting: Internal Medicine

## 2019-01-10 ENCOUNTER — Ambulatory Visit (INDEPENDENT_AMBULATORY_CARE_PROVIDER_SITE_OTHER): Payer: Medicare Other | Admitting: Internal Medicine

## 2019-01-10 VITALS — Ht 69.0 in | Wt 190.0 lb

## 2019-01-10 DIAGNOSIS — R194 Change in bowel habit: Secondary | ICD-10-CM

## 2019-01-10 DIAGNOSIS — K52832 Lymphocytic colitis: Secondary | ICD-10-CM

## 2019-01-10 DIAGNOSIS — K219 Gastro-esophageal reflux disease without esophagitis: Secondary | ICD-10-CM

## 2019-01-10 DIAGNOSIS — R197 Diarrhea, unspecified: Secondary | ICD-10-CM

## 2019-01-10 MED ORDER — BUDESONIDE 3 MG PO CPEP: 9.0000 mg | ORAL_CAPSULE | Freq: Every day | ORAL | 2 refills | Status: DC

## 2019-01-10 NOTE — Addendum Note (Signed)
Addended by: Larina Bras on: 01/10/2019 02:20 PM   Modules accepted: Orders

## 2019-01-10 NOTE — Patient Instructions (Addendum)
We have sent the following medications to your pharmacy for you to pick up at your convenience: Budesonide (look at instructions below for guidance rather than bottle instructions)  We recommend that you decrease budesonide to 6 mg (3 capsules) daily for 4 weeks.  If doing well, then decrease budesonide further to 3 mg daily (1 capsul) for 4 weeks.  If doing well at that point, discontinue the medication.  If you have a relapse at any point, you may need to increase your dosage again.  You should contact the office for specific guidance should you need to do this.   Please follow up with Dr Henrene Pastor in office in 3 months.

## 2019-01-10 NOTE — Progress Notes (Signed)
HISTORY OF PRESENT ILLNESS:  Adam Villanueva is a 68 y.o. male who presents today via telemedicine during the coronavirus pandemic for follow-up regarding problems with diarrhea.  The patient was last seen in the office November 15, 2018 regarding a 2-year history of change in bowel habits as manifested by increased frequency and less formed stool.  Also bloating.  See that dictation for details.  He was empirically treated with metronidazole for 2 weeks.  This did not help.  Review of his prior colonoscopy 2018 revealed a hyperplastic colon polyp.  Interestingly, the surrounding mucosa demonstrated intraepithelial lymphocytosis as is seen with lymphocytic colitis.  Thus, on December 06, 2018 he was prescribed budesonide 9 mg daily.  Within 1 week he had abrupt improvement in his bowel habits.  He went from 4-5 softer loose stools with urgency per day to 1 normal-appearing formed bowel movement per day.  He states this is his baseline.  No appreciable medication side effects.  He has questions regarding NSAID use and lymphocytic colitis.  He needs NSAIDs for back pain.  He also takes pantoprazole for chronic GERD.  He denies any new interval problems.  REVIEW OF SYSTEMS:  All non-GI ROS negative unless otherwise stated in the HPI except for allergies, anxiety, arthritis, back pain Past Medical History:  Diagnosis Date  . Allergy   . Anxiety   . Arthritis   . Cataract   . Chronic insomnia   . GERD (gastroesophageal reflux disease)   . Hyperlipidemia   . Hypertension   . Lower back pain    and mid back pain  . Prostatitis   . Sleep apnea    no cpap    Past Surgical History:  Procedure Laterality Date  . COLONOSCOPY    . POLYPECTOMY    . SEPTOPLASTY     broken nose  . WISDOM TOOTH EXTRACTION      Social History Adam Villanueva  reports that he has never smoked. He has never used smokeless tobacco. He reports current alcohol use. He reports that he does not use drugs.  family history  includes Arthritis in his father, maternal grandfather, maternal grandmother, mother, paternal grandfather, and paternal grandmother; Hyperlipidemia in his mother; Hypertension in his father and mother; Rectal cancer in his mother; Throat cancer in his brother.  No Known Allergies     PHYSICAL EXAMINATION: No physical examination due to telemedicine visit    ASSESSMENT:   1.  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 2.  GERD.  On pantoprazole which controls symptoms   PLAN:  1.  We discussed the pathophysiology of microscopic colitis.  Discussed possible associated medication such as NSAIDs and PPI.  Discussed response to budesonide therapy in general as well as tapering strategies and expected outcomes. 2.  Recommend that he decrease budesonide to 6 mg daily for 4 weeks.  If doing well, then decrease budesonide further to 3 mg daily for 4 weeks.  If doing well at that point, discontinue the medication.  He understands that he may have a relapse at any point in which case he may need to increase his dosage.  He should contact the office for specific guidance.  He agrees.  We will REFILL BUDESONIDE. 3.  Routine office follow-up 3 months.  This telemedicine conference was initiated and approved by the patient who consented to  this visit.  He was in his home and I was in my office.  He understands there may be an associated professional charge

## 2019-01-17 ENCOUNTER — Ambulatory Visit: Payer: Medicare Other | Admitting: Internal Medicine

## 2019-04-04 ENCOUNTER — Other Ambulatory Visit: Payer: Self-pay

## 2019-04-04 ENCOUNTER — Encounter: Payer: Self-pay | Admitting: Family Medicine

## 2019-04-04 ENCOUNTER — Ambulatory Visit (INDEPENDENT_AMBULATORY_CARE_PROVIDER_SITE_OTHER): Payer: Medicare Other | Admitting: Family Medicine

## 2019-04-04 VITALS — BP 116/72 | HR 72 | Temp 97.8°F | Ht 69.0 in | Wt 186.6 lb

## 2019-04-04 DIAGNOSIS — I10 Essential (primary) hypertension: Secondary | ICD-10-CM

## 2019-04-04 DIAGNOSIS — Z23 Encounter for immunization: Secondary | ICD-10-CM | POA: Diagnosis not present

## 2019-04-04 DIAGNOSIS — Z0000 Encounter for general adult medical examination without abnormal findings: Secondary | ICD-10-CM | POA: Diagnosis not present

## 2019-04-04 DIAGNOSIS — E785 Hyperlipidemia, unspecified: Secondary | ICD-10-CM

## 2019-04-04 DIAGNOSIS — Z125 Encounter for screening for malignant neoplasm of prostate: Secondary | ICD-10-CM

## 2019-04-04 DIAGNOSIS — E781 Pure hyperglyceridemia: Secondary | ICD-10-CM

## 2019-04-04 LAB — HEPATIC FUNCTION PANEL
ALT: 18 U/L (ref 0–53)
AST: 17 U/L (ref 0–37)
Albumin: 4.4 g/dL (ref 3.5–5.2)
Alkaline Phosphatase: 75 U/L (ref 39–117)
Bilirubin, Direct: 0.1 mg/dL (ref 0.0–0.3)
Total Bilirubin: 0.6 mg/dL (ref 0.2–1.2)
Total Protein: 6.9 g/dL (ref 6.0–8.3)

## 2019-04-04 LAB — CBC WITH DIFFERENTIAL/PLATELET
Basophils Absolute: 0 10*3/uL (ref 0.0–0.1)
Basophils Relative: 0.9 % (ref 0.0–3.0)
Eosinophils Absolute: 0.2 10*3/uL (ref 0.0–0.7)
Eosinophils Relative: 3.5 % (ref 0.0–5.0)
HCT: 47.8 % (ref 39.0–52.0)
Hemoglobin: 16.4 g/dL (ref 13.0–17.0)
Lymphocytes Relative: 23.5 % (ref 12.0–46.0)
Lymphs Abs: 1 10*3/uL (ref 0.7–4.0)
MCHC: 34.4 g/dL (ref 30.0–36.0)
MCV: 89.6 fl (ref 78.0–100.0)
Monocytes Absolute: 0.5 10*3/uL (ref 0.1–1.0)
Monocytes Relative: 10.3 % (ref 3.0–12.0)
Neutro Abs: 2.7 10*3/uL (ref 1.4–7.7)
Neutrophils Relative %: 61.8 % (ref 43.0–77.0)
Platelets: 215 10*3/uL (ref 150.0–400.0)
RBC: 5.33 Mil/uL (ref 4.22–5.81)
RDW: 14.7 % (ref 11.5–15.5)
WBC: 4.4 10*3/uL (ref 4.0–10.5)

## 2019-04-04 LAB — BASIC METABOLIC PANEL
BUN: 12 mg/dL (ref 6–23)
CO2: 27 mEq/L (ref 19–32)
Calcium: 8.9 mg/dL (ref 8.4–10.5)
Chloride: 97 mEq/L (ref 96–112)
Creatinine, Ser: 0.96 mg/dL (ref 0.40–1.50)
GFR: 78.36 mL/min (ref >60.00)
Glucose, Bld: 92 mg/dL (ref 70–99)
Potassium: 4.7 mEq/L (ref 3.5–5.1)
Sodium: 134 mEq/L — ABNORMAL LOW (ref 135–145)

## 2019-04-04 LAB — TSH: TSH: 1.21 u[IU]/mL (ref 0.35–4.50)

## 2019-04-04 LAB — LIPID PANEL
Cholesterol: 185 mg/dL (ref 0–200)
HDL: 33.9 mg/dL — ABNORMAL LOW (ref >39.00)
Total CHOL/HDL Ratio: 5
Triglycerides: 502 mg/dL — ABNORMAL HIGH (ref 0.0–149.0)

## 2019-04-04 LAB — PSA: PSA: 3.78 ng/mL (ref 0.10–4.00)

## 2019-04-04 LAB — LDL CHOLESTEROL, DIRECT: Direct LDL: 78 mg/dL

## 2019-04-04 MED ORDER — ROSUVASTATIN CALCIUM 10 MG PO TABS: 10.0000 mg | ORAL_TABLET | Freq: Every day | ORAL | 3 refills | Status: DC

## 2019-04-04 NOTE — Progress Notes (Addendum)
Subjective:     Patient ID: Adam Villanueva, male   DOB: 02-18-1951, 68 y.o.   MRN: [id number]  HPI Patient is seen for physical exam.  His chronic problems include history of GERD, hyperlipidemia, hypertension.  Generally feels well.  He states he pulled a muscle in his upper back recently and this morning accidentally contused his left lower rib cage area.  Otherwise feels well.  No recent fever or cough.  No dyspnea.  No chest pains.  Medications reviewed and compliant with all.  We reviewed the following health maintenance issues  -No history of pneumonia vaccine. -He had previous Zostavax -Colonoscopy up-to-date-next due 01/04/2022 -Tetanus 2014 -Previous hepatitis C screening negative  Past Medical History:  Diagnosis Date  . Allergy   . Anxiety   . Arthritis   . Cataract   . Chronic insomnia   . GERD (gastroesophageal reflux disease)   . Hyperlipidemia   . Hypertension   . Lower back pain    and mid back pain  . Prostatitis   . Sleep apnea    no cpap   Past Surgical History:  Procedure Laterality Date  . COLONOSCOPY    . POLYPECTOMY    . SEPTOPLASTY     broken nose  . WISDOM TOOTH EXTRACTION      reports that he has never smoked. He has never used smokeless tobacco. He reports current alcohol use. He reports that he does not use drugs. family history includes Arthritis in his father, maternal grandfather, maternal grandmother, mother, paternal grandfather, and paternal grandmother; Hyperlipidemia in his mother; Hypertension in his father and mother; Rectal cancer in his mother; Throat cancer in his brother. No Known Allergies    Review of Systems  Constitutional: Negative for activity change, appetite change, fatigue and fever.  HENT: Negative for congestion, ear pain and trouble swallowing.   Eyes: Negative for pain and visual disturbance.  Respiratory: Negative for cough, shortness of breath and wheezing.   Cardiovascular: Negative for chest pain and  palpitations.  Gastrointestinal: Negative for abdominal distention, abdominal pain, blood in stool, constipation, diarrhea, nausea, rectal pain and vomiting.  Endocrine: Negative for polydipsia and polyuria.  Genitourinary: Negative for dysuria, hematuria and testicular pain.  Musculoskeletal: Negative for arthralgias and joint swelling.  Skin: Negative for rash.  Neurological: Negative for dizziness, syncope, weakness and headaches.  Hematological: Negative for adenopathy. Does not bruise/bleed easily.  Psychiatric/Behavioral: Negative for confusion and dysphoric mood.       Objective:   Physical Exam Constitutional:      General: He is not in acute distress.    Appearance: He is well-developed.  HENT:     Head: Normocephalic and atraumatic.     Right Ear: External ear normal.     Left Ear: External ear normal.  Eyes:     Conjunctiva/sclera: Conjunctivae normal.     Pupils: Pupils are equal, round, and reactive to light.  Neck:     Musculoskeletal: Normal range of motion and neck supple.     Thyroid: No thyromegaly.  Cardiovascular:     Rate and Rhythm: Normal rate and regular rhythm.     Heart sounds: Normal heart sounds. No murmur.  Pulmonary:     Effort: No respiratory distress.     Breath sounds: No wheezing or rales.  Abdominal:     General: Bowel sounds are normal. There is no distension.     Palpations: Abdomen is soft. There is no mass.     Tenderness: There is  no abdominal tenderness. There is no guarding or rebound.  Musculoskeletal:     Right lower leg: No edema.     Left lower leg: No edema.  Lymphadenopathy:     Cervical: No cervical adenopathy.  Skin:    Findings: No rash.  Neurological:     Mental Status: He is alert and oriented to person, place, and time.     Cranial Nerves: No cranial nerve deficit.     Deep Tendon Reflexes: Reflexes normal.  Psychiatric:        Mood and Affect: Mood normal.        Thought Content: Thought content normal.         Assessment:     #1 Physical exam.  Patient has hypertension which is well controlled and hyperlipidemia on Crestor.  We discussed the following health maintenance issues  #2 hypertension- stable  #3 hyperlipdiemia    Plan:     -Continue with annual flu vaccine -Prevnar 13 given and recommend Pneumovax in 1 year -We discussed Shingrix vaccine and he will consider -Obtain screening labs -Repeat colonoscopy 2023 -BMP (dx= hypertension) -lipids and hepatic (dx=hyperlipidemia)  Eulas Post MD [facility]

## 2019-04-04 NOTE — Patient Instructions (Signed)
Continue with flu vaccine this Fall  Consider newer shingles vaccine (Shingrix) and can get at pharmacy if interested.

## 2019-04-04 NOTE — Addendum Note (Signed)
Addended by: Anibal Henderson on: 04/04/2019 10:39 AM   Modules accepted: Orders

## 2019-04-10 ENCOUNTER — Encounter: Payer: Self-pay | Admitting: General Surgery

## 2019-04-10 NOTE — Progress Notes (Signed)
CC: 3 month follow up for diarrhea, pt having no symptoms at this time

## 2019-04-11 ENCOUNTER — Encounter: Payer: Self-pay | Admitting: Internal Medicine

## 2019-04-11 ENCOUNTER — Ambulatory Visit (INDEPENDENT_AMBULATORY_CARE_PROVIDER_SITE_OTHER): Payer: Medicare Other | Admitting: Internal Medicine

## 2019-04-11 VITALS — Ht 70.0 in | Wt 186.0 lb

## 2019-04-11 DIAGNOSIS — K52832 Lymphocytic colitis: Secondary | ICD-10-CM | POA: Diagnosis not present

## 2019-04-11 DIAGNOSIS — K219 Gastro-esophageal reflux disease without esophagitis: Secondary | ICD-10-CM | POA: Diagnosis not present

## 2019-04-11 DIAGNOSIS — R197 Diarrhea, unspecified: Secondary | ICD-10-CM

## 2019-04-11 NOTE — Progress Notes (Addendum)
HISTORY OF PRESENT ILLNESS:  Adam Villanueva is a 68 y.o. male who presents today via telemedicine during the coronavirus pandemic for follow-up regarding problems with diarrhea thought secondary to lymphocytic colitis.  He was seen in the office November 15, 2018 regarding a 2-year history of change in bowel habits as manifested by increased frequency with less formed and bloating.  Empiric treatment with metronidazole did not help.  Review of previous colonoscopy from 2018 revealed hyperplastic polyp with surrounding mucosa showing changes consistent with lymphocytic colitis.  No symptoms at that time.  Due to these findings he was placed on budesonide 9 mg daily.  He was seen in follow-up virtually January 10, 2019.  Marked improvement at that time.  See that dictation.  He was instructed to taper his budesonide by 3 mg every 4 weeks until off.  Follow-up at this time requested.  Patient tells me that he did wean down to 3 mg but could not wean off.  It is unclear that he understood to do so.  In any event, with weaning of budesonide dose no worsening of symptoms.  Currently describes 1 occasionally 2 bowel movements per day.  On the soft side but no diarrhea.  No appreciable medication side effects.  No new complaints.  Review of interval blood work includes comprehensive metabolic panel obtained April 04, 2019.  This was unremarkable.  As well, CBC at that time was normal with hemoglobin 16.4.  REVIEW OF SYSTEMS:  All non-GI ROS negative unless otherwise stated in the HPI except for allergies, arthritis  Past Medical History:  Diagnosis Date  . Allergy   . Anxiety   . Arthritis   . Cataract   . Chronic insomnia   . GERD (gastroesophageal reflux disease)   . Hyperlipidemia   . Hypertension   . Lower back pain    and mid back pain  . Prostatitis   . Sleep apnea    no cpap    Past Surgical History:  Procedure Laterality Date  . COLONOSCOPY    . POLYPECTOMY    . SEPTOPLASTY     broken nose   . WISDOM TOOTH EXTRACTION      Social History VANDER KUEKER  reports that he has never smoked. He has never used smokeless tobacco. He reports current alcohol use. He reports that he does not use drugs.  family history includes Arthritis in his father, maternal grandfather, maternal grandmother, mother, paternal grandfather, and paternal grandmother; Hyperlipidemia in his mother; Hypertension in his father and mother; Rectal cancer in his mother; Throat cancer in his brother.  No Known Allergies     PHYSICAL EXAMINATION: No physical exam with telehealth visit  ASSESSMENT:  1.  Lymphocytic colitis.  Symptoms resolved on budesonide.  Tolerating taper without recurrence   PLAN:  1.  Continue budesonide 3 mg daily for an additional 2 weeks then stop. 2.  Contact this office for recurrent symptoms as he may need retreated.  We discussed relapse rates 3.  Patient did have questions regarding budesonide.  We discussed the effects, side effects, and medication risks 4.  Assuming he is doing well off medication, routine follow-up 1 year This telehealth medicine visit was initiated by consented for by the patient who was in his home while I was in my office.  He understands her may be an associated professional charge for this service which totaled 15 minutes

## 2019-04-12 NOTE — Patient Instructions (Signed)
1.  Continue budesonide 3 mg daily for an additional 2 weeks then stop.  2.  Contact this office for recurrent symptoms as he may need retreated.  We discussed relapse rates  3.  Patient did have questions regarding budesonide.  We discussed the effects, side effects, and medication risks  4.  Assuming he is doing well off medication, routine follow-up 1 year.  Dr. Blanch Media schedule does not go out that far so please call the office a couple of months before next July to set this up.

## 2019-04-19 ENCOUNTER — Telehealth: Payer: Self-pay

## 2019-04-19 NOTE — Telephone Encounter (Signed)
Called patient and he stated that he was able to get an appointment with his urology doctor so nothing further needed at this time.  Copied from [facility] [id number]. Topic: Appointment Scheduling - Scheduling Inquiry for Clinic >> Apr 19, 2019  8:28 AM Richardo Priest, NT wrote: Reason for CRM: attempted to call line 4x. Patient called in stating he would like an appointment for his slow urination and slight discomfort as he urinates. States he has had it for going on 3+ weeks. Please advise and call back is [phone]. >> Apr 19, 2019  8:51 AM Cox, Melburn Hake, CMA wrote: In office or virtual?

## 2019-06-12 ENCOUNTER — Telehealth: Payer: Self-pay | Admitting: Internal Medicine

## 2019-06-12 NOTE — Telephone Encounter (Signed)
Resume budesonide 9 mg daily and arrange office follow-up in 1 month

## 2019-06-12 NOTE — Telephone Encounter (Signed)
Pt states he and Dr. Henrene Pastor had talked and they decided to stop the budesonide. Pt states that over the past 2 weeks he has started having diarrhea again 2-3 times/day and his stomach is rumbling and rolling. Pt states he feels he is right back where he started prior to taking the budesonide. Please advise.

## 2019-06-12 NOTE — Telephone Encounter (Signed)
Pt aware and ov scheduled with Dr. Henrene Pastor 10/21@3 :20pm, pt aware.

## 2019-06-12 NOTE — Telephone Encounter (Signed)
Pt states that since he stopped taking budisonide sxs have gradually come back for the past two weeks. He wants some advise, pls call him.

## 2019-06-22 ENCOUNTER — Other Ambulatory Visit: Payer: Self-pay

## 2019-06-22 ENCOUNTER — Ambulatory Visit (INDEPENDENT_AMBULATORY_CARE_PROVIDER_SITE_OTHER): Payer: Medicare Other

## 2019-06-22 DIAGNOSIS — Z23 Encounter for immunization: Secondary | ICD-10-CM | POA: Diagnosis not present

## 2019-07-19 ENCOUNTER — Ambulatory Visit: Payer: Medicare Other | Admitting: Internal Medicine

## 2019-07-19 ENCOUNTER — Encounter: Payer: Self-pay | Admitting: Internal Medicine

## 2019-07-19 VITALS — BP 110/70 | HR 88 | Temp 98.6°F | Ht 69.0 in | Wt 190.2 lb

## 2019-07-19 DIAGNOSIS — K219 Gastro-esophageal reflux disease without esophagitis: Secondary | ICD-10-CM | POA: Diagnosis not present

## 2019-07-19 DIAGNOSIS — R197 Diarrhea, unspecified: Secondary | ICD-10-CM

## 2019-07-19 DIAGNOSIS — K52832 Lymphocytic colitis: Secondary | ICD-10-CM

## 2019-07-19 MED ORDER — BUDESONIDE 3 MG PO CPEP: 9.0000 mg | ORAL_CAPSULE | Freq: Every day | ORAL | 6 refills | Status: DC

## 2019-07-19 NOTE — Progress Notes (Signed)
HISTORY OF PRESENT ILLNESS:  Adam Villanueva is a 69 y.o. male with a history of lymphocytic colitis (colonoscopy from 2018 with hyperplastic polyp and surrounding mucosa showing changes consistent with lymphocytic colitis.  Asymptomatic at that time) for which he was evaluated for symptoms of diarrhea in February 2020.  He did not respond to empiric metronidazole and was subsequently placed on budesonide.  He responded well to budesonide.  He was last seen via telehealth medicine April 11, 2019 at which time he was asymptomatic on 3 mg of budesonide.  He tapered off over the next 2 weeks.  He did well for approximately 1 month but then developed recurrent diarrhea.  This persisted and was as severe as it had been in February.  He was placed back on budesonide 9 mg daily which he has been on for about 1 month.  He presents for follow-up today.  The patient reports that he had gradual improvement in his bowels over the past month.  This week they are essentially normal.  No other active complaints or issues at this time.  He does take ibuprofen and pantoprazole which can be risk factors for microscopic colitis.  Review of blood work from March 28, 2018 finds normal comprehensive metabolic panel.  Last colonoscopy January 04, 2017.  CT scan of the abdomen and pelvis without contrast May 2015 revealed no acute abnormalities.  REVIEW OF SYSTEMS:  All non-GI ROS negative unless otherwise stated in the HPI except for back pain, arthritis, anxiety, headaches, allergies, itching, sleeping problems, fatigue  Past Medical History:  Diagnosis Date  . Allergy   . Anxiety   . Arthritis   . Cataract   . Chronic insomnia   . GERD (gastroesophageal reflux disease)   . Hyperlipidemia   . Hypertension   . Lower back pain    and mid back pain  . Prostatitis   . Sleep apnea    no cpap    Past Surgical History:  Procedure Laterality Date  . COLONOSCOPY    . POLYPECTOMY    . SEPTOPLASTY     broken nose  . WISDOM  TOOTH EXTRACTION      Social History Adam Villanueva  reports that he has never smoked. He has never used smokeless tobacco. He reports current alcohol use. He reports that he does not use drugs.  family history includes Arthritis in his father, maternal grandfather, maternal grandmother, mother, paternal grandfather, and paternal grandmother; Hyperlipidemia in his mother; Hypertension in his father and mother; Rectal cancer in his mother; Throat cancer in his brother.  No Known Allergies     PHYSICAL EXAMINATION: Vital signs: BP 110/70 (BP Location: Left Arm, Patient Position: Sitting, Cuff Size: Normal)   Pulse 88   Temp 98.6 F (37 C)   Ht 5\' 9"  (1.753 m) Comment: height measured without shoes  Wt 190 lb 4 oz (86.3 kg)   BMI 28.10 kg/m   Constitutional: generally well-appearing, no acute distress Psychiatric: alert and oriented x3, cooperative Eyes: extraocular movements intact, anicteric, conjunctiva pink Mouth: oral pharynx moist, no lesions Neck: supple no lymphadenopathy Cardiovascular: heart regular rate and rhythm, no murmur Lungs: clear to auscultation bilaterally Abdomen: soft, nontender, nondistended, no obvious ascites, no peritoneal signs, normal bowel sounds, no organomegaly Rectal: Omitted Extremities: no clubbing, cyanosis, or lower extremity edema bilaterally Skin: no lesions on visible extremities Neuro: No focal deficits.  Cranial nerves intact  ASSESSMENT:  1.  Lymphocytic colitis with diarrhea.  Initially responded to budesonide therapy  but relapsed about 1 month after tapering off therapy.  Now with response to reinitiation of budesonide 9 mg daily 2.  GERD.  Controlled with PPI   PLAN:  1.  He will continue on budesonide.  We will taper the medication a bit slower in hopes of obtaining more durable response.  As such, I have asked him to continue budesonide 9 mg daily until the end of this year.  Then decrease to 6 mg daily for 3 months.  Then  decrease to 3 mg daily for 3 months 2.  Routine office follow-up in 6 months 3.  Contact the office in the interim for any questions or problems.  He agrees 4.  Prescription for budesonide refilled.  We did discuss medication effects and side effects in detail. 25-minute spent face-to-face with the patient.  Greater than 50% of the time used for counseling regarding his recurrent lymphocytic colitis.

## 2019-07-19 NOTE — Patient Instructions (Signed)
We have sent the following medications to your pharmacy for you to pick up at your convenience:  Budesonide  Stay on 9mg  of Budesonide until September 29, 2019.  Then begin taking 6mg  a day.  Please follow up with Dr. Henrene Pastor in 6 months

## 2019-07-24 ENCOUNTER — Encounter (INDEPENDENT_AMBULATORY_CARE_PROVIDER_SITE_OTHER): Payer: Self-pay

## 2019-08-04 ENCOUNTER — Other Ambulatory Visit: Payer: Self-pay | Admitting: Family Medicine

## 2019-08-04 DIAGNOSIS — I10 Essential (primary) hypertension: Secondary | ICD-10-CM

## 2019-08-18 ENCOUNTER — Other Ambulatory Visit: Payer: Self-pay

## 2019-08-18 ENCOUNTER — Ambulatory Visit: Payer: Self-pay | Admitting: *Deleted

## 2019-08-18 NOTE — Telephone Encounter (Signed)
Patient has an appointment on Monday.

## 2019-08-18 NOTE — Telephone Encounter (Signed)
Noted. Will see Monday.  

## 2019-08-18 NOTE — Telephone Encounter (Signed)
Pt called stating that he had this place on his upper chest near his neck on the left side. The area was flat but now it looks like it is raised. His provider had looked at it before. He has had it for  the last few years. He denies pain or fever. The color of it was a tanned color but now it has a dark spot in the center. The area is oval in shape.  It is about the size of a dime.  Per protocol he should be seen within 2 weeks. He would like an appointment sooner. Notified [facility] at [facility] for an appointment. Call conference in to the practice. Routing to the practice. Reason for Disposition . [1] Skin growth or mole AND [2] sticks up out of the skin (elevated) AND [3] feels rough to the touch  Protocols used: [place]

## 2019-08-18 NOTE — Telephone Encounter (Signed)
  Answer Assessment - Initial Assessment Questions 1. APPEARANCE of LESION: "What does it look like?"      Was flat now raised area 2. SIZE: "How big is it?" (e.g., compare to size of pinhead, tip of pen, eraser, coin, pea, grape, ping pong ball; or size in cms or inches)      The size of a dime 3. COLOR: "What color is it?" "Is there more than one color?"     Tan and dark 4. SHAPE: "What shape is it?" (e.g., round, irregular)     round 5. RAISED: "Does it stick up above the skin or is it flat?" (e.g., raised or elevated)     raised 6. TENDER: "Does it hurt when you touch it?"  (Scale 1-10; or mild, moderate, severe)     no 7. LOCATION: "Where is it located?"      Upper chest, near the left side of his neck 8. ONSET: "When did it first appear?"      Noticed that it has changed recently 9. NUMBER: "Is there just one?" or "Are there others?"     no 10. CAUSE: "What do you think it is?"       Not sure 11. OTHER SYMPTOMS: "Do you have any other symptoms?" (e.g., fever)       no 12. PREGNANCY: "Is there any chance you are pregnant?" "When was your last menstrual period?"       n/a  Protocols used: [place]

## 2019-08-21 ENCOUNTER — Ambulatory Visit (INDEPENDENT_AMBULATORY_CARE_PROVIDER_SITE_OTHER): Payer: Medicare Other | Admitting: Family Medicine

## 2019-08-21 ENCOUNTER — Other Ambulatory Visit: Payer: Self-pay

## 2019-08-21 ENCOUNTER — Encounter: Payer: Self-pay | Admitting: Family Medicine

## 2019-08-21 VITALS — BP 130/74 | HR 90 | Temp 97.9°F | Ht 69.0 in | Wt 188.0 lb

## 2019-08-21 DIAGNOSIS — L821 Other seborrheic keratosis: Secondary | ICD-10-CM

## 2019-08-21 NOTE — Progress Notes (Signed)
  Subjective:     Patient ID: Adam Villanueva, male   DOB: 23-Mar-1951, 68 y.o.   MRN: [id number]  HPI   Witold seen to have a couple of skin lesions evaluated.  He has 1 brownish scaly area left upper chest wall that itches some.  He thinks this may have changed slightly in terms of more scaly over the past year.  No personal history of skin cancer.  He also has a skin tag left side of neck that is irritating because of location and frequently rubbing against clothing.  Past Medical History:  Diagnosis Date  . Allergy   . Anxiety   . Arthritis   . Cataract   . Chronic insomnia   . GERD (gastroesophageal reflux disease)   . Hyperlipidemia   . Hypertension   . Lower back pain    and mid back pain  . Prostatitis   . Sleep apnea    no cpap   Past Surgical History:  Procedure Laterality Date  . COLONOSCOPY    . POLYPECTOMY    . SEPTOPLASTY     broken nose  . WISDOM TOOTH EXTRACTION      reports that he has never smoked. He has never used smokeless tobacco. He reports current alcohol use. He reports that he does not use drugs. family history includes Arthritis in his father, maternal grandfather, maternal grandmother, mother, paternal grandfather, and paternal grandmother; Hyperlipidemia in his mother; Hypertension in his father and mother; Rectal cancer in his mother; Throat cancer in his brother. No Known Allergies   Review of Systems  Constitutional: Negative for appetite change and unexpected weight change.       Objective:   Physical Exam Vitals signs reviewed.  Constitutional:      Appearance: Normal appearance.  Cardiovascular:     Rate and Rhythm: Normal rate and regular rhythm.  Pulmonary:     Effort: Pulmonary effort is normal.     Breath sounds: Normal breath sounds.  Skin:    Comments: Small benign-appearing skin tag left side of neck.  Left upper chest wall oval well-demarcated light brown skin lesion with slightly crusted hyperkeratotic surface.  1 and half  centimeters in greatest dimension by about 9 mm  Neurological:     Mental Status: He is alert.        Assessment:     Benign-appearing seborrheic keratosis left upper chest wall    Plan:     -We discussed possible treatments including liquid nitrogen.  Discussed risks including pain, blistering, infection and patient consented.  Lesion was treated without difficulty.  Be in touch if this is not resolving over the next couple weeks  Eulas Post MD [facility]

## 2019-08-21 NOTE — Patient Instructions (Signed)
Seborrheic Keratosis A seborrheic keratosis is a common, noncancerous (benign) skin growth. These growths are velvety, waxy, rough, tan, brown, or black spots that appear on the skin. These skin growths can be flat or raised, and scaly. What are the causes? The cause of this condition is not known. What increases the risk? You are more likely to develop this condition if you:  Have a family history of seborrheic keratosis.  Are 50 or older.  Are pregnant.  Have had estrogen replacement therapy. What are the signs or symptoms? Symptoms of this condition include growths on the face, chest, shoulders, back, or other areas. These growths:  Are usually painless, but may become irritated and itchy.  Can be yellow, brown, black, or other colors.  Are slightly raised or have a flat surface.  Are sometimes rough or wart-like in texture.  Are often velvety or waxy on the surface.  Are round or oval-shaped.  Often occur in groups, but may occur as a single growth. How is this diagnosed? This condition is diagnosed with a medical history and physical exam.  A sample of the growth may be tested (skin biopsy).  You may need to see a skin specialist (dermatologist). How is this treated? Treatment is not usually needed for this condition, unless the growths are irritated or bleed often.  You may also choose to have the growths removed if you do not like their appearance. ? Most commonly, these growths are treated with a procedure in which liquid nitrogen is applied to "freeze" off the growth (cryosurgery). ? They may also be burned off with electricity (electrocautery) or removed by scraping (curettage). Follow these instructions at home:  Watch your growth for any changes.  Keep all follow-up visits as told by your health care provider. This is important.  Do not scratch or pick at the growth or growths. This can cause them to become irritated or infected. Contact a health care  provider if:  You suddenly have many new growths.  Your growth bleeds, itches, or hurts.  Your growth suddenly becomes larger or changes color. Summary  A seborrheic keratosis is a common, noncancerous (benign) skin growth.  Treatment is not usually needed for this condition, unless the growths are irritated or bleed often.  Watch your growth for any changes.  Contact a health care provider if you suddenly have many new growths or your growth suddenly becomes larger or changes color.  Keep all follow-up visits as told by your health care provider. This is important. This information is not intended to replace advice given to you by your health care provider. Make sure you discuss any questions you have with your health care provider. Document Released: 10/17/2010 Document Revised: 01/27/2018 Document Reviewed: 01/27/2018 Elsevier Patient Education  2020 Elsevier Inc.  

## 2019-11-15 ENCOUNTER — Telehealth: Payer: Self-pay | Admitting: Internal Medicine

## 2019-11-15 NOTE — Telephone Encounter (Signed)
Patient is calling in reference to Budesonide medcation

## 2019-11-15 NOTE — Telephone Encounter (Signed)
Patient was questioning if he should continue on Budesonide as it was very expensive.  I reviewed with him Dr. Blanch Media instructions from October to take 9mg  till the end of the year and then 6mg  for three months and then 3mg  for three months.  Patient said he will go ahead and continue taking it and then follow up with Dr. Henrene Pastor later in the spring.

## 2019-11-17 ENCOUNTER — Telehealth: Payer: Self-pay | Admitting: Internal Medicine

## 2019-11-17 MED ORDER — BUDESONIDE 3 MG PO CPEP: 9.0000 mg | ORAL_CAPSULE | Freq: Every day | ORAL | 6 refills | Status: DC

## 2019-11-17 NOTE — Telephone Encounter (Signed)
Pt needs rf for Budisonide but he states that [facility] told him that copay is over $1,000. Pt has been asking in different pharmacies and has found out that the price varies significantly from one pharmacy to another. He wants to know if we are aware of a pharmacy where the price is much more affordable or if he could be prescribed something similar.

## 2019-11-17 NOTE — Telephone Encounter (Signed)
Spoke to patient and told him to download the [facility] app on his phone.  Then sent his prescription over to the [facility] on [address] where good [facility] showed the cheapest price.  Patient will call me Monday if he has any problems.

## 2020-01-13 ENCOUNTER — Other Ambulatory Visit: Payer: Self-pay | Admitting: Family Medicine

## 2020-01-13 DIAGNOSIS — I10 Essential (primary) hypertension: Secondary | ICD-10-CM

## 2020-04-08 ENCOUNTER — Other Ambulatory Visit: Payer: Self-pay

## 2020-04-08 ENCOUNTER — Telehealth: Payer: Self-pay | Admitting: Internal Medicine

## 2020-04-08 DIAGNOSIS — T148XXA Other injury of unspecified body region, initial encounter: Secondary | ICD-10-CM

## 2020-04-08 NOTE — Telephone Encounter (Signed)
Patient called regarding his Budesonide.  Patient has been taking this for a while and per Dr. Blanch Media most recent instructions has tapered down to 3mg  a day.  He is due for a follow up appointment in the office so we scheduled that for 06/05/20.  Patient is concerned that he seems to see an increase in bruising and read that this is a possible side effect of Budesonide.  As far as a know this is a new problem.  Please advise.  :)

## 2020-04-08 NOTE — Telephone Encounter (Signed)
Dr. Hilarie Fredrickson as DOD please see note below on [facility] pt and advise.

## 2020-04-08 NOTE — Telephone Encounter (Signed)
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 Henrene Pastor for his review when he returns

## 2020-04-09 NOTE — Telephone Encounter (Signed)
Spoke with pt and he is aware, orders in epic for labs.

## 2020-04-11 ENCOUNTER — Other Ambulatory Visit (INDEPENDENT_AMBULATORY_CARE_PROVIDER_SITE_OTHER): Payer: Medicare Other

## 2020-04-11 DIAGNOSIS — T148XXA Other injury of unspecified body region, initial encounter: Secondary | ICD-10-CM | POA: Diagnosis not present

## 2020-04-11 LAB — CBC WITH DIFFERENTIAL/PLATELET
Basophils Absolute: 0.1 10*3/uL (ref 0.0–0.1)
Basophils Relative: 1.2 % (ref 0.0–3.0)
Eosinophils Absolute: 0.2 10*3/uL (ref 0.0–0.7)
Eosinophils Relative: 3.9 % (ref 0.0–5.0)
HCT: 48 % (ref 39.0–52.0)
Hemoglobin: 17 g/dL (ref 13.0–17.0)
Lymphocytes Relative: 27.8 % (ref 12.0–46.0)
Lymphs Abs: 1.3 10*3/uL (ref 0.7–4.0)
MCHC: 35.5 g/dL (ref 30.0–36.0)
MCV: 90.1 fl (ref 78.0–100.0)
Monocytes Absolute: 0.4 10*3/uL (ref 0.1–1.0)
Monocytes Relative: 9.3 % (ref 3.0–12.0)
Neutro Abs: 2.8 10*3/uL (ref 1.4–7.7)
Neutrophils Relative %: 57.8 % (ref 43.0–77.0)
Platelets: 226 10*3/uL (ref 150.0–400.0)
RBC: 5.33 Mil/uL (ref 4.22–5.81)
RDW: 13.5 % (ref 11.5–15.5)
WBC: 4.8 10*3/uL (ref 4.0–10.5)

## 2020-04-11 LAB — PROTIME-INR
INR: 1 ratio (ref 0.8–1.0)
Prothrombin Time: 11.5 s (ref 9.6–13.1)

## 2020-04-26 ENCOUNTER — Other Ambulatory Visit: Payer: Self-pay

## 2020-04-26 ENCOUNTER — Ambulatory Visit (INDEPENDENT_AMBULATORY_CARE_PROVIDER_SITE_OTHER): Payer: Medicare Other | Admitting: Family Medicine

## 2020-04-26 ENCOUNTER — Encounter: Payer: Self-pay | Admitting: Family Medicine

## 2020-04-26 VITALS — BP 126/74 | HR 64 | Temp 97.9°F | Ht 69.0 in | Wt 184.5 lb

## 2020-04-26 DIAGNOSIS — Z23 Encounter for immunization: Secondary | ICD-10-CM

## 2020-04-26 DIAGNOSIS — Z0000 Encounter for general adult medical examination without abnormal findings: Secondary | ICD-10-CM

## 2020-04-26 NOTE — Patient Instructions (Addendum)

## 2020-04-26 NOTE — Progress Notes (Signed)
Established Patient Office Visit  Subjective:  Patient ID: Adam Villanueva, male    DOB: [birth year]  Age: 69 y.o. MRN: [id number]  CC:  Chief Complaint  Patient presents with  . Annual Exam    no new concerns     HPI Adam Villanueva presents for physical exam.  His chronic problems include hypertension, GERD, osteoarthritis, chronic insomnia, hyperlipidemia.  He recently called GI office with some easy bruising of the forearms.  He had CBC and INR which were normal.  He does take aspirin usually 2 daily for low back pain.  His back pain was improved when he was doing yoga.  He does take budesonide from GI.  Health maintenance reviewed  -Needs Pneumovax -Covid vaccines already given -Prior hepatitis C screening negative -Tetanus due 2024 -Colonoscopy due 2023 -Gets yearly flu vaccine  Past Medical History:  Diagnosis Date  . Allergy   . Anxiety   . Arthritis   . Cataract   . Chronic insomnia   . GERD (gastroesophageal reflux disease)   . Hyperlipidemia   . Hypertension   . Lower back pain    and mid back pain  . Prostatitis   . Sleep apnea    no cpap    Past Surgical History:  Procedure Laterality Date  . COLONOSCOPY    . POLYPECTOMY    . SEPTOPLASTY     broken nose  . WISDOM TOOTH EXTRACTION      Family History  Problem Relation Age of Onset  . Arthritis Mother        rheumatoid  . Hyperlipidemia Mother   . Hypertension Mother   . Rectal cancer Mother        26 years  . Arthritis Father   . Hypertension Father   . Arthritis Maternal Grandmother   . Arthritis Maternal Grandfather   . Arthritis Paternal Grandmother   . Arthritis Paternal Grandfather   . Throat cancer Brother   . Colon polyps Neg Hx   . Esophageal cancer Neg Hx   . Stomach cancer Neg Hx     Social History   Socioeconomic History  . Marital status: Divorced    Spouse name: Not on file  . Number of children: Not on file  . Years of education: Not on file  . Highest education  level: Not on file  Occupational History  . Not on file  Tobacco Use  . Smoking status: Never Smoker  . Smokeless tobacco: Never Used  Vaping Use  . Vaping Use: Never used  Substance and Sexual Activity  . Alcohol use: Yes    Alcohol/week: 0.0 standard drinks    Comment: occ beer  . Drug use: No  . Sexual activity: Yes    Partners: Female  Other Topics Concern  . Not on file  Social History Narrative  . Not on file   Social Determinants of Health   Financial Resource Strain:   . Difficulty of Paying Living Expenses:   Food Insecurity:   . Worried About Charity fundraiser in the Last Year:   . Arboriculturist in the Last Year:   Transportation Needs:   . Film/video editor (Medical):   Marland Kitchen Lack of Transportation (Non-Medical):   Physical Activity:   . Days of Exercise per Week:   . Minutes of Exercise per Session:   Stress:   . Feeling of Stress :   Social Connections:   . Frequency of Communication with Friends and  Family:   . Frequency of Social Gatherings with Friends and Family:   . Attends Religious Services:   . Active Member of Clubs or Organizations:   . Attends Archivist Meetings:   Marland Kitchen Marital Status:   Intimate Partner Violence:   . Fear of Current or Ex-Partner:   . Emotionally Abused:   Marland Kitchen Physically Abused:   . Sexually Abused:     Outpatient Medications Prior to Visit  Medication Sig Dispense Refill  . budesonide (ENTOCORT EC) 3 MG 24 hr capsule Take 3 capsules (9 mg total) by mouth daily. 90 capsule 6  . cholecalciferol (VITAMIN D) 1000 units tablet Take 1,000 Units by mouth daily.    Marland Kitchen esomeprazole (NEXIUM) 20 MG capsule 20 mg. Uses only PRN for flare ups.    Marland Kitchen ibuprofen (ADVIL,MOTRIN) 200 MG tablet Take 400 mg by mouth every 6 (six) hours as needed (pain).    Marland Kitchen lisinopril (ZESTRIL) 40 MG tablet TAKE 1 TABLET BY MOUTH  DAILY 90 tablet 3  . meloxicam (MOBIC) 15 MG tablet     . metoprolol succinate (TOPROL-XL) 50 MG 24 hr tablet TAKE 1  TABLET BY MOUTH  DAILY WITH OR IMMEDIATELY  FOLLOWING A MEAL 90 tablet 2  . Multiple Vitamin (MULTIVITAMIN) tablet Take 1 tablet by mouth daily.    . Omega-3 Fatty Acids (OMEGA-3 FISH OIL PO) Take by mouth daily.     . pantoprazole (PROTONIX) 40 MG tablet TAKE 1 TABLET BY MOUTH  DAILY 90 tablet 2  . rosuvastatin (CRESTOR) 10 MG tablet Take 1 tablet (10 mg total) by mouth daily. (Patient taking differently: Take 10 mg by mouth as needed. ) 90 tablet 3  . sildenafil (REVATIO) 20 MG tablet Take 2 to 5 tablets one hour prior to sexual activity. 50 tablet 5  . tamsulosin (FLOMAX) 0.4 MG CAPS capsule Take 1 capsule by mouth daily.    . Turmeric 400 MG CAPS Take by mouth as needed.    . vitamin C (ASCORBIC ACID) 500 MG tablet Take 500 mg by mouth daily.    . fluticasone (FLONASE) 50 MCG/ACT nasal spray Place 1 spray into both nostrils 2 (two) times daily. (Patient taking differently: Place 1 spray into both nostrils as needed. ) 16 g 0   No facility-administered medications prior to visit.    No Known Allergies  ROS Review of Systems  Constitutional: Negative for activity change, appetite change, fatigue and fever.  HENT: Negative for congestion, ear pain and trouble swallowing.   Eyes: Negative for pain and visual disturbance.  Respiratory: Negative for cough, shortness of breath and wheezing.   Cardiovascular: Negative for chest pain and palpitations.  Gastrointestinal: Negative for abdominal distention, abdominal pain, blood in stool, constipation, diarrhea, nausea, rectal pain and vomiting.  Genitourinary: Negative for dysuria, hematuria and testicular pain.  Musculoskeletal: Negative for arthralgias and joint swelling.  Skin: Negative for rash.  Neurological: Negative for dizziness, syncope and headaches.  Hematological: Negative for adenopathy. Bruises/bleeds easily.  Psychiatric/Behavioral: Negative for confusion and dysphoric mood.      Objective:    Physical Exam Constitutional:       General: He is not in acute distress.    Appearance: He is well-developed.  HENT:     Head: Normocephalic and atraumatic.     Right Ear: External ear normal.     Left Ear: External ear normal.  Eyes:     Conjunctiva/sclera: Conjunctivae normal.     Pupils: Pupils are equal, round, and  reactive to light.  Neck:     Thyroid: No thyromegaly.  Cardiovascular:     Rate and Rhythm: Normal rate and regular rhythm.     Heart sounds: Normal heart sounds. No murmur heard.   Pulmonary:     Effort: No respiratory distress.     Breath sounds: No wheezing or rales.  Abdominal:     General: Bowel sounds are normal. There is no distension.     Palpations: Abdomen is soft. There is no mass.     Tenderness: There is no abdominal tenderness. There is no guarding or rebound.  Musculoskeletal:     Cervical back: Normal range of motion and neck supple.  Lymphadenopathy:     Cervical: No cervical adenopathy.  Skin:    Findings: No rash.     Comments: Does have several small bruises on both forearms  No bruising noted on the trunk or lower extremities.  Neurological:     Mental Status: He is alert and oriented to person, place, and time.     Cranial Nerves: No cranial nerve deficit.     Deep Tendon Reflexes: Reflexes normal.     BP 126/74 (BP Location: Left Arm, Patient Position: Sitting, Cuff Size: Normal)   Pulse 64   Temp 97.9 F (36.6 C) (Oral)   Ht 5\' 9"  (1.753 m)   Wt 184 lb 8 oz (83.7 kg)   SpO2 98%   BMI 27.25 kg/m  Wt Readings from Last 3 Encounters:  04/26/20 184 lb 8 oz (83.7 kg)  08/21/19 188 lb (85.3 kg)  07/19/19 190 lb 4 oz (86.3 kg)     There are no preventive care reminders to display for this patient.  There are no preventive care reminders to display for this patient.  Lab Results  Component Value Date   TSH 1.21 04/04/2019   Lab Results  Component Value Date   WBC 4.8 04/11/2020   HGB 17.0 04/11/2020   HCT 48.0 04/11/2020   MCV 90.1 04/11/2020    PLT 226.0 04/11/2020   Lab Results  Component Value Date   NA 134 (L) 04/04/2019   K 4.7 04/04/2019   CO2 27 04/04/2019   GLUCOSE 92 04/04/2019   BUN 12 04/04/2019   CREATININE 0.96 04/04/2019   BILITOT 0.6 04/04/2019   ALKPHOS 75 04/04/2019   AST 17 04/04/2019   ALT 18 04/04/2019   PROT 6.9 04/04/2019   ALBUMIN 4.4 04/04/2019   CALCIUM 8.9 04/04/2019   GFR 78.36 04/04/2019   Lab Results  Component Value Date   CHOL 185 04/04/2019   Lab Results  Component Value Date   HDL 33.90 (L) 04/04/2019   Lab Results  Component Value Date   LDLCALC 22 02/20/2014   Lab Results  Component Value Date   TRIG (H) 04/04/2019    502.0 Triglyceride is over 400; calculations on Lipids are invalid.   Lab Results  Component Value Date   CHOLHDL 5 04/04/2019   Lab Results  Component Value Date   HGBA1C 5.6 01/22/2016      Assessment & Plan:   Problem List Items Addressed This Visit    None    Visit Diagnoses    Physical exam    -  Primary   Relevant Orders   Lipid panel   Basic metabolic panel   TSH   Hepatic function panel   PSA   Need for pneumococcal vaccination       Relevant Orders   Pneumococcal polysaccharide vaccine  23-valent greater than or equal to 2yo subcutaneous/IM (Completed)    He describes some easy bruising but is taking daily aspirin.  Recent CBC and INR normal.  We recommend he try to back off and really ideally stop the aspirin  Obtain follow-up labs minus CBC which he had done recently  Continue with annual flu vaccine  Pneumovax given  He had previous Zostavax.  We discussed Shingrix vaccine which he will consider  No orders of the defined types were placed in this encounter.   Follow-up: No follow-ups on file.    Carolann Littler, MD

## 2020-04-27 LAB — BASIC METABOLIC PANEL
BUN: 12 mg/dL (ref 7–25)
CO2: 26 mmol/L (ref 20–32)
Calcium: 9 mg/dL (ref 8.6–10.3)
Chloride: 101 mmol/L (ref 98–110)
Creat: 0.92 mg/dL (ref 0.70–1.25)
Glucose, Bld: 94 mg/dL (ref 65–99)
Potassium: 4 mmol/L (ref 3.5–5.3)
Sodium: 137 mmol/L (ref 135–146)

## 2020-04-27 LAB — HEPATIC FUNCTION PANEL
AG Ratio: 1.7 (calc) (ref 1.0–2.5)
ALT: 22 U/L (ref 9–46)
AST: 20 U/L (ref 10–35)
Albumin: 4.3 g/dL (ref 3.6–5.1)
Alkaline phosphatase (APISO): 70 U/L (ref 35–144)
Bilirubin, Direct: 0.1 mg/dL (ref 0.0–0.2)
Globulin: 2.5 g/dL (calc) (ref 1.9–3.7)
Indirect Bilirubin: 0.5 mg/dL (calc) (ref 0.2–1.2)
Total Bilirubin: 0.6 mg/dL (ref 0.2–1.2)
Total Protein: 6.8 g/dL (ref 6.1–8.1)

## 2020-04-27 LAB — LIPID PANEL
Cholesterol: 244 mg/dL — ABNORMAL HIGH (ref <200)
HDL: 35 mg/dL — ABNORMAL LOW (ref >=40)
LDL Cholesterol (Calc): 163 mg/dL (calc) — ABNORMAL HIGH
Non-HDL Cholesterol (Calc): 209 mg/dL (calc) — ABNORMAL HIGH (ref <130)
Total CHOL/HDL Ratio: 7 (calc) — ABNORMAL HIGH (ref <5.0)
Triglycerides: 296 mg/dL — ABNORMAL HIGH (ref <150)

## 2020-04-27 LAB — EXTRA LAV TOP TUBE

## 2020-04-27 LAB — PSA: PSA: 3.2 ng/mL (ref <=4.0)

## 2020-04-27 LAB — TSH: TSH: 0.87 mIU/L (ref 0.40–4.50)

## 2020-06-05 ENCOUNTER — Ambulatory Visit: Payer: Medicare Other | Admitting: Internal Medicine

## 2020-06-05 ENCOUNTER — Encounter: Payer: Self-pay | Admitting: Internal Medicine

## 2020-06-05 VITALS — BP 132/70 | HR 80 | Ht 69.0 in | Wt 189.4 lb

## 2020-06-05 DIAGNOSIS — K52832 Lymphocytic colitis: Secondary | ICD-10-CM

## 2020-06-05 DIAGNOSIS — I10 Essential (primary) hypertension: Secondary | ICD-10-CM | POA: Diagnosis not present

## 2020-06-05 DIAGNOSIS — K219 Gastro-esophageal reflux disease without esophagitis: Secondary | ICD-10-CM

## 2020-06-05 MED ORDER — BUDESONIDE 3 MG PO CPEP: 9.0000 mg | ORAL_CAPSULE | Freq: Every day | ORAL | 6 refills | Status: DC

## 2020-06-05 MED ORDER — PANTOPRAZOLE SODIUM 40 MG PO TBEC: 40.0000 mg | DELAYED_RELEASE_TABLET | Freq: Every day | ORAL | 3 refills | Status: DC

## 2020-06-05 NOTE — Progress Notes (Signed)
HISTORY OF PRESENT ILLNESS:  Adam Villanueva is a 69 y.o. male with a history of lymphocytic colitis (colonoscopy from 2018 with hyperplastic polyp and surrounding mucosa showing changes consistent with lymphocytic colitis.  Asymptomatic at that time) for which he was evaluated for symptoms of diarrhea in February 2020.  He did not respond to empiric metronidazole and was subsequently placed on budesonide.  He responded well to budesonide.  He was then seen via telehealth medicine April 11, 2019 at which time he was asymptomatic on 3 mg of budesonide.  He tapered off over the next 2 weeks.  He did well for approximately 1 month but then developed recurrent diarrhea.  This persisted and was as severe as it had been in February.  He was placed back on budesonide 9 mg daily which he has been on for about 1 month.  He was seen in follow-up July 19, 2019.  The patient reported that he had gradual improvement in his bowels over the previous month and were essentially normal.  No other active complaints or issues at that time.  He was instructed to continue on budesonide 9 mg daily for 3 months then decrease to 6 mg daily for 3 months, then 3 mg daily for 3 months.  He was to follow-up in 6 months, but follows up today. He does take ibuprofen and pantoprazole which can be risk factors for microscopic colitis.  Review of blood work from March 28, 2020 finds normal comprehensive metabolic panel and CBC with hemoglobin 17.0.  Last colonoscopy January 04, 2017.  CT scan of the abdomen and pelvis without contrast May 2015 revealed no acute abnormalities.  Patient tells me that he has been on budesonide 3 mg daily for about 2 months.  States that he was doing well until couple weeks ago.  He currently describes 1 or 2 mostly formed bowel movements per day.  No urgency.  Uses Imodium sparingly.  He is worried about easy bruisability related to budesonide therapy.  He has completed his Covid vaccination series.  He will be due for  his surveillance colonoscopy in 2023 (sessile cecal hyperplastic polyp 2018).  His GI review of systems is otherwise negative.  He reports good control of her symptoms on PPI therapy.  He alternates between pantoprazole and Nexium.  REVIEW OF SYSTEMS:  All non-GI ROS negative unless otherwise stated in the HPI except for arthritis, back pain, fatigue, skin rash, headaches, sleeping problems, sinus and allergy  Past Medical History:  Diagnosis Date  . Allergy   . Anxiety   . Arthritis   . Cataract   . Chronic insomnia   . GERD (gastroesophageal reflux disease)   . Hyperlipidemia   . Hypertension   . Lower back pain    and mid back pain  . Prostatitis   . Sleep apnea    no cpap    Past Surgical History:  Procedure Laterality Date  . COLONOSCOPY    . POLYPECTOMY    . SEPTOPLASTY     broken nose  . WISDOM TOOTH EXTRACTION      Social History Adam Villanueva  reports that he has never smoked. He has never used smokeless tobacco. He reports current alcohol use. He reports that he does not use drugs.  family history includes Arthritis in his father, maternal grandfather, maternal grandmother, mother, paternal grandfather, and paternal grandmother; Hyperlipidemia in his mother; Hypertension in his father and mother; Rectal cancer in his mother; Throat cancer in his brother.  No Known Allergies     PHYSICAL EXAMINATION: Vital signs: BP 132/70 (BP Location: Left Arm, Patient Position: Sitting, Cuff Size: Normal)   Pulse 80   Ht 5\' 9"  (1.753 m)   Wt 189 lb 6 oz (85.9 kg)   BMI 27.97 kg/m   Constitutional: generally well-appearing, no acute distress Psychiatric: alert and oriented x3, cooperative Eyes: extraocular movements intact, anicteric, conjunctiva pink Mouth: oral pharynx moist, no lesions Neck: supple no lymphadenopathy Cardiovascular: heart regular rate and rhythm, no murmur Lungs: clear to auscultation bilaterally Abdomen: soft, nontender, nondistended, no obvious  ascites, no peritoneal signs, normal bowel sounds, no organomegaly Rectal: Omitted Extremities: no clubbing, cyanosis, or lower extremity edema bilaterally Skin: Ecchymoses on the extremities.  No additional lesions on visible extremities Neuro: No focal deficits.  Cranial nerves intact  ASSESSMENT:  1.  Lymphocytic colitis treated with budesonide successfully.  Relapse off medication with subsequent long-term slow taper.  Currently on 3 mg daily with minimal bowel irregularities. 2.  History of sessile cecal hyperplastic polyp 2018 3.  GERD.  Controlled with PPI 4.  Colonoscopy 2018 with sessile cecal polyp  PLAN:  1.  Continue budesonide for 1 month, then stop. 2.  May use Imodium more liberally, if needed 3.  Budesonide refilled should it be necessary to reinstitute therapy in the future.  Medication risks reviewed 4.  Pantoprazole refilled.  Medication risks reviewed 5.  Reflux precautions 6.  Surveillance colonoscopy 2023 7.  Routine office follow-up 1 year.  He does contact the office in the interim for questions or problems. Total time of 30 minutes was spent preparing to see the patient, reviewing test laboratories, obtaining comprehensive interval history, performing comprehensive physical exam, counseling the patient regarding his above listed issues, ordering medications and directing medical therapy, and documenting clinical information in the health record

## 2020-06-05 NOTE — Patient Instructions (Signed)
We have sent the following medications to your pharmacy for you to pick up at your convenience:  Protonix, Budesonide.  Take 3mg  of Budesonide for one more month and then stop.  If things worsen take Imodium as needed.  Please follow up in one year

## 2020-06-18 ENCOUNTER — Ambulatory Visit: Payer: Medicare Other

## 2020-06-25 ENCOUNTER — Ambulatory Visit: Payer: Medicare Other

## 2020-06-25 ENCOUNTER — Other Ambulatory Visit: Payer: Self-pay | Admitting: Family Medicine

## 2020-06-25 DIAGNOSIS — I10 Essential (primary) hypertension: Secondary | ICD-10-CM

## 2020-06-27 ENCOUNTER — Ambulatory Visit (INDEPENDENT_AMBULATORY_CARE_PROVIDER_SITE_OTHER): Payer: Medicare Other

## 2020-06-27 ENCOUNTER — Other Ambulatory Visit: Payer: Self-pay

## 2020-06-27 DIAGNOSIS — Z0000 Encounter for general adult medical examination without abnormal findings: Secondary | ICD-10-CM | POA: Diagnosis not present

## 2020-06-27 NOTE — Patient Instructions (Signed)
[name] , Thank you for taking time to come for your Medicare Wellness Visit. I appreciate your ongoing commitment to your health goals. Please review the following plan we discussed and let me know if I can assist you in the future.   Screening recommendations/referrals: Colonoscopy: Up to date, next due 01/04/2022 Recommended yearly ophthalmology/optometry visit for glaucoma screening and checkup Recommended yearly dental visit for hygiene and checkup  Vaccinations: Influenza vaccine: Currently due, you may schedule a flu shot with [place] or receive at your local pharmacy  Pneumococcal vaccine: Completed series Tdap vaccine: Up to date, next due 02/24/2023 Shingles vaccine: Currently due for Shingrix, you may receive at your pharmacy.    Advanced directives: Please bring a copy of your medical advanced directives into the office so that we may scan them into your chart.  Conditions/risks identified: None   Next appointment: None   Preventive Care 67 Years and Older, Male Preventive care refers to lifestyle choices and visits with your health care provider that can promote health and wellness. What does preventive care include?  A yearly physical exam. This is also called an annual well check.  Dental exams once or twice a year.  Routine eye exams. Ask your health care provider how often you should have your eyes checked.  Personal lifestyle choices, including:  Daily care of your teeth and gums.  Regular physical activity.  Eating a healthy diet.  Avoiding tobacco and drug use.  Limiting alcohol use.  Practicing safe sex.  Taking low doses of aspirin every day.  Taking vitamin and mineral supplements as recommended by your health care provider. What happens during an annual well check? The services and screenings done by your health care provider during your annual well check will depend on your age, overall health, lifestyle risk factors, and family history of  disease. Counseling  Your health care provider may ask you questions about your:  Alcohol use.  Tobacco use.  Drug use.  Emotional well-being.  Home and relationship well-being.  Sexual activity.  Eating habits.  History of falls.  Memory and ability to understand (cognition).  Work and work Statistician. Screening  You may have the following tests or measurements:  Height, weight, and BMI.  Blood pressure.  Lipid and cholesterol levels. These may be checked every 5 years, or more frequently if you are over 67 years old.  Skin check.  Lung cancer screening. You may have this screening every year starting at age 67 if you have a 30-pack-year history of smoking and currently smoke or have quit within the past 15 years.  Fecal occult blood test (FOBT) of the stool. You may have this test every year starting at age 67.  Flexible sigmoidoscopy or colonoscopy. You may have a sigmoidoscopy every 5 years or a colonoscopy every 10 years starting at age 67.  Prostate cancer screening. Recommendations will vary depending on your family history and other risks.  Hepatitis C blood test.  Hepatitis B blood test.  Sexually transmitted disease (STD) testing.  Diabetes screening. This is done by checking your blood sugar (glucose) after you have not eaten for a while (fasting). You may have this done every 1-3 years.  Abdominal aortic aneurysm (AAA) screening. You may need this if you are a current or former smoker.  Osteoporosis. You may be screened starting at age 67 if you are at high risk. Talk with your health care provider about your test results, treatment options, and if necessary, the need for  more tests. Vaccines  Your health care provider may recommend certain vaccines, such as:  Influenza vaccine. This is recommended every year.  Tetanus, diphtheria, and acellular pertussis (Tdap, Td) vaccine. You may need a Td booster every 10 years.  Zoster vaccine. You may  need this after age 67.  Pneumococcal 13-valent conjugate (PCV13) vaccine. One dose is recommended after age 67.  Pneumococcal polysaccharide (PPSV23) vaccine. One dose is recommended after age 67. Talk to your health care provider about which screenings and vaccines you need and how often you need them. This information is not intended to replace advice given to you by your health care provider. Make sure you discuss any questions you have with your health care provider. Document Released: 10/11/2015 Document Revised: 06/03/2016 Document Reviewed: 07/16/2015 Elsevier Interactive Patient Education  2017 [place] Prevention in the Home Falls can cause injuries. They can happen to people of all ages. There are many things you can do to make your home safe and to help prevent falls. What can I do on the outside of my home?  Regularly fix the edges of walkways and driveways and fix any cracks.  Remove anything that might make you trip as you walk through a door, such as a raised step or threshold.  Trim any bushes or trees on the path to your home.  Use bright outdoor lighting.  Clear any walking paths of anything that might make someone trip, such as rocks or tools.  Regularly check to see if handrails are loose or broken. Make sure that both sides of any steps have handrails.  Any raised decks and porches should have guardrails on the edges.  Have any leaves, snow, or ice cleared regularly.  Use sand or salt on walking paths during winter.  Clean up any spills in your garage right away. This includes oil or grease spills. What can I do in the bathroom?  Use night lights.  Install grab bars by the toilet and in the tub and shower. Do not use towel bars as grab bars.  Use non-skid mats or decals in the tub or shower.  If you need to sit down in the shower, use a plastic, non-slip stool.  Keep the floor dry. Clean up any water that spills on the floor as soon as it  happens.  Remove soap buildup in the tub or shower regularly.  Attach bath mats securely with double-sided non-slip rug tape.  Do not have throw rugs and other things on the floor that can make you trip. What can I do in the bedroom?  Use night lights.  Make sure that you have a light by your bed that is easy to reach.  Do not use any sheets or blankets that are too big for your bed. They should not hang down onto the floor.  Have a firm chair that has side arms. You can use this for support while you get dressed.  Do not have throw rugs and other things on the floor that can make you trip. What can I do in the kitchen?  Clean up any spills right away.  Avoid walking on wet floors.  Keep items that you use a lot in easy-to-reach places.  If you need to reach something above you, use a strong step stool that has a grab bar.  Keep electrical cords out of the way.  Do not use floor polish or wax that makes floors slippery. If you must use wax, use non-skid  floor wax.  Do not have throw rugs and other things on the floor that can make you trip. What can I do with my stairs?  Do not leave any items on the stairs.  Make sure that there are handrails on both sides of the stairs and use them. Fix handrails that are broken or loose. Make sure that handrails are as long as the stairways.  Check any carpeting to make sure that it is firmly attached to the stairs. Fix any carpet that is loose or worn.  Avoid having throw rugs at the top or bottom of the stairs. If you do have throw rugs, attach them to the floor with carpet tape.  Make sure that you have a light switch at the top of the stairs and the bottom of the stairs. If you do not have them, ask someone to add them for you. What else can I do to help prevent falls?  Wear shoes that:  Do not have high heels.  Have rubber bottoms.  Are comfortable and fit you well.  Are closed at the toe. Do not wear sandals.  If you  use a stepladder:  Make sure that it is fully opened. Do not climb a closed stepladder.  Make sure that both sides of the stepladder are locked into place.  Ask someone to hold it for you, if possible.  Clearly mark and make sure that you can see:  Any grab bars or handrails.  First and last steps.  Where the edge of each step is.  Use tools that help you move around (mobility aids) if they are needed. These include:  Canes.  Walkers.  Scooters.  Crutches.  Turn on the lights when you go into a dark area. Replace any light bulbs as soon as they burn out.  Set up your furniture so you have a clear path. Avoid moving your furniture around.  If any of your floors are uneven, fix them.  If there are any pets around you, be aware of where they are.  Review your medicines with your doctor. Some medicines can make you feel dizzy. This can increase your chance of falling. Ask your doctor what other things that you can do to help prevent falls. This information is not intended to replace advice given to you by your health care provider. Make sure you discuss any questions you have with your health care provider. Document Released: 07/11/2009 Document Revised: 02/20/2016 Document Reviewed: 10/19/2014 Elsevier Interactive Patient Education  2017 [employer].

## 2020-06-27 NOTE — Progress Notes (Signed)
Subjective:   Adam Villanueva is a 69 y.o. male who presents for Medicare Annual/Subsequent preventive examination.  I connected with Adam Villanueva  today by telephone and verified that I am speaking with the correct person using two identifiers. Location patient: home Location provider: work Persons participating in the virtual visit: patient, provider.   I discussed the limitations, risks, security and privacy concerns of performing an evaluation and management service by telephone and the availability of in person appointments. I also discussed with the patient that there may be a patient responsible charge related to this service. The patient expressed understanding and verbally consented to this telephonic visit.    Interactive audio and video telecommunications were attempted between this provider and patient, however failed, due to patient having technical difficulties OR patient did not have access to video capability.  We continued and completed visit with audio only.      Review of Systems    N/A  Cardiac Risk Factors include: advanced age (>71men, >4 women);dyslipidemia;hypertension     Objective:    Today's Vitals   06/27/20 1039  PainSc: 3    There is no height or weight on file to calculate BMI.  Advanced Directives 06/27/2020 01/04/2017 12/23/2016  Does Patient Have a Medical Advance Directive? Yes Yes Yes  Type of Paramedic of [place];Living will [place];Living will [place];Living will  Does patient want to make changes to medical advance directive? No - Patient declined - -  Copy of [place] in Chart? No - copy requested No - copy requested -    Current Medications (verified) Outpatient Encounter Medications as of 06/27/2020  Medication Sig  . budesonide (ENTOCORT EC) 3 MG 24 hr capsule Take 3 capsules (9 mg total) by mouth daily.  . cholecalciferol (VITAMIN D) 1000 units  tablet Take 1,000 Units by mouth daily.  Marland Kitchen esomeprazole (NEXIUM) 20 MG capsule 20 mg. Uses only PRN for flare ups.  Marland Kitchen ibuprofen (ADVIL,MOTRIN) 200 MG tablet Take 400 mg by mouth every 6 (six) hours as needed (pain).  Marland Kitchen lisinopril (ZESTRIL) 40 MG tablet TAKE 1 TABLET BY MOUTH  DAILY  . metoprolol succinate (TOPROL-XL) 50 MG 24 hr tablet TAKE 1 TABLET BY MOUTH  DAILY WITH OR IMMEDIATELY  FOLLOWING A MEAL  . Multiple Vitamin (MULTIVITAMIN) tablet Take 1 tablet by mouth daily.  . Omega-3 Fatty Acids (OMEGA-3 FISH OIL PO) Take by mouth daily.   . pantoprazole (PROTONIX) 40 MG tablet Take 1 tablet (40 mg total) by mouth daily.  . rosuvastatin (CRESTOR) 10 MG tablet Take 1 tablet (10 mg total) by mouth daily. (Patient taking differently: Take 10 mg by mouth as needed. )  . Turmeric 400 MG CAPS Take by mouth as needed.  . vitamin C (ASCORBIC ACID) 500 MG tablet Take 500 mg by mouth daily.  . meloxicam (MOBIC) 15 MG tablet  (Patient not taking: Reported on 06/27/2020)  . sildenafil (REVATIO) 20 MG tablet Take 2 to 5 tablets one hour prior to sexual activity. (Patient not taking: Reported on 06/27/2020)  . tamsulosin (FLOMAX) 0.4 MG CAPS capsule Take 1 capsule by mouth daily. (Patient not taking: Reported on 06/27/2020)   No facility-administered encounter medications on file as of 06/27/2020.    Allergies (verified) Patient has no known allergies.   History: Past Medical History:  Diagnosis Date  . Allergy   . Anxiety   . Arthritis   . Cataract   .  Chronic insomnia   . GERD (gastroesophageal reflux disease)   . Hyperlipidemia   . Hypertension   . Lower back pain    and mid back pain  . Prostatitis   . Sleep apnea    no cpap   Past Surgical History:  Procedure Laterality Date  . COLONOSCOPY    . POLYPECTOMY    . SEPTOPLASTY     broken nose  . WISDOM TOOTH EXTRACTION     Family History  Problem Relation Age of Onset  . Arthritis Mother        rheumatoid  . Hyperlipidemia Mother     . Hypertension Mother   . Rectal cancer Mother        24 years  . Arthritis Father   . Hypertension Father   . Arthritis Maternal Grandmother   . Arthritis Maternal Grandfather   . Arthritis Paternal Grandmother   . Arthritis Paternal Grandfather   . Throat cancer Brother   . Colon polyps Neg Hx   . Esophageal cancer Neg Hx   . Stomach cancer Neg Hx    Social History   Socioeconomic History  . Marital status: Divorced    Spouse name: Not on file  . Number of children: Not on file  . Years of education: Not on file  . Highest education level: Not on file  Occupational History  . Not on file  Tobacco Use  . Smoking status: Never Smoker  . Smokeless tobacco: Never Used  Vaping Use  . Vaping Use: Never used  Substance and Sexual Activity  . Alcohol use: Yes    Alcohol/week: 0.0 standard drinks    Comment: occ beer  . Drug use: No  . Sexual activity: Yes    Partners: Female  Other Topics Concern  . Not on file  Social History Narrative  . Not on file   Social Determinants of Health   Financial Resource Strain: Low Risk   . Difficulty of Paying Living Expenses: Not hard at all  Food Insecurity: No Food Insecurity  . Worried About Charity fundraiser in the Last Year: Never true  . Ran Out of Food in the Last Year: Never true  Transportation Needs: No Transportation Needs  . Lack of Transportation (Medical): No  . Lack of Transportation (Non-Medical): No  Physical Activity: Insufficiently Active  . Days of Exercise per Week: 3 days  . Minutes of Exercise per Session: 30 min  Stress: No Stress Concern Present  . Feeling of Stress : Not at all  Social Connections: Socially Integrated  . Frequency of Communication with Friends and Family: Once a week  . Frequency of Social Gatherings with Friends and Family: More than three times a week  . Attends Religious Services: More than 4 times per year  . Active Member of Clubs or Organizations: Yes  . Attends Theatre manager Meetings: More than 4 times per year  . Marital Status: Married    Tobacco Counseling Counseling given: Not Answered   Clinical Intake:  Pre-visit preparation completed: Yes  Pain : 0-10 Pain Score: 3  Pain Type: Chronic pain Pain Location: Back Pain Orientation: Lower Pain Radiating Towards: both legs, and hips Pain Descriptors / Indicators: Shooting Pain Onset: More than a month ago Pain Frequency: Intermittent Pain Relieving Factors: Yoga, Nsaids  Pain Relieving Factors: Yoga, Nsaids  Nutritional Risks: None Diabetes: No  How often do you need to have someone help you when you read instructions, pamphlets, or other  written materials from your doctor or pharmacy?: 1 - Never What is the last grade level you completed in school?: 2 Years College  Diabetic?no  Interpreter Needed?: No  Information entered by :: [place] of Daily Living In your present state of health, do you have any difficulty performing the following activities: 06/27/2020  Hearing? N  Vision? N  Difficulty concentrating or making decisions? N  Walking or climbing stairs? N  Dressing or bathing? N  Doing errands, shopping? N  Preparing Food and eating ? N  Using the Toilet? N  In the past six months, have you accidently leaked urine? N  Do you have problems with loss of bowel control? N  Managing your Medications? N  Managing your Finances? N  Housekeeping or managing your Housekeeping? N  Some recent data might be hidden    Patient Care Team: Eulas Post, MD as PCP - General (Family Medicine)  Indicate any recent Medical Services you may have received from other than [facility] providers in the past year (date may be approximate).     Assessment:   This is a routine wellness examination for [place].  Hearing/Vision screen  Hearing Screening   125Hz  250Hz  500Hz  1000Hz  2000Hz  3000Hz  4000Hz  6000Hz  8000Hz   Right ear:           Left ear:           Vision  Screening Comments: Patient states gets his eyes checked yearly. Has early developing cataracts   Dietary issues and exercise activities discussed: Current Exercise Habits: Home exercise routine, Type of exercise: walking, Time (Minutes): 35, Frequency (Times/Week): 3, Weekly Exercise (Minutes/Week): 105, Intensity: Mild, Exercise limited by: None identified  Goals    . Patient Stated     I will continue to walk 3x per week for at least 30-40 minutes       Depression Screen PHQ 2/9 Scores 06/27/2020 04/26/2020 04/04/2019 03/28/2018 07/05/2017 05/24/2015  PHQ - 2 Score 0 0 1 0 0 0  PHQ- 9 Score 0 - 1 - - -    Fall Risk Fall Risk  06/27/2020 04/26/2020 04/04/2019 03/28/2018  Falls in the past year? 0 0 0 No  Number falls in past yr: 0 0 - -  Injury with Fall? 0 0 - -  Follow up Falls evaluation completed;Falls prevention discussed Falls evaluation completed - -    Any stairs in or around the home? No  If so, are there any without handrails? No  Home free of loose throw rugs in walkways, pet beds, electrical cords, etc? Yes  Adequate lighting in your home to reduce risk of falls? Yes   ASSISTIVE DEVICES UTILIZED TO PREVENT FALLS:  Life alert? No  Use of a cane, walker or w/c? No  Grab bars in the bathroom? Yes  Shower chair or bench in shower? No  Elevated toilet seat or a handicapped toilet? Yes     Cognitive Function:    Cognitive screening not indicated based on direct observation     Immunizations Immunization History  Administered Date(s) Administered  . Fluad Quad(high Dose 65+) 06/22/2019  . Influenza Split 08/01/2012  . Influenza,inj,Quad PF,6+ Mos 05/26/2013, 06/19/2014, 07/04/2015, 07/15/2016, 07/05/2017, 07/29/2018  . PFIZER SARS-COV-2 Vaccination 11/21/2019, 12/12/2019  . Pneumococcal Conjugate-13 04/04/2019  . Pneumococcal Polysaccharide-23 04/26/2020  . Tdap 02/23/2013  . Zoster 02/28/2014    TDAP status: Up to date Flu Vaccine status: Declined, Education has  been provided regarding the importance of this vaccine but patient  still declined. Advised may receive this vaccine at local pharmacy or Health Dept. Aware to provide a copy of the vaccination record if obtained from local pharmacy or Health Dept. Verbalized acceptance and understanding. Pneumococcal vaccine status: Completed during today's visit. Covid-19 vaccine status: Completed vaccines  Qualifies for Shingles Vaccine? Yes   Zostavax completed Yes   Shingrix Completed?: No.    Education has been provided regarding the importance of this vaccine. Patient has been advised to call insurance company to determine out of pocket expense if they have not yet received this vaccine. Advised may also receive vaccine at local pharmacy or Health Dept. Verbalized acceptance and understanding.  Screening Tests Health Maintenance  Topic Date Due  . INFLUENZA VACCINE  04/28/2020  . COLONOSCOPY  01/04/2022  . TETANUS/TDAP  02/24/2023  . COVID-19 Vaccine  Completed  . Hepatitis C Screening  Completed  . PNA vac Low Risk Adult  Completed    Health Maintenance  Health Maintenance Due  Topic Date Due  . INFLUENZA VACCINE  04/28/2020    Colorectal cancer screening: Completed 01/04/2017. Repeat every 5 years  Lung Cancer Screening: (Low Dose CT Chest recommended if Age 74-80 years, 30 pack-year currently smoking OR have quit w/in 15years.) does not qualify.   Lung Cancer Screening Referral: N/A  Additional Screening:  Hepatitis C Screening: does qualify; Completed 01/22/2016  Vision Screening: Recommended annual ophthalmology exams for early detection of glaucoma and other disorders of the eye. Is the patient up to date with their annual eye exam?  Yes  Who is the provider or what is the name of the office in which the patient attends annual eye exams? Dr. Prudencio Burly  If pt is not established with a provider, would they like to be referred to a provider to establish care? No .   Dental Screening:  Recommended annual dental exams for proper oral hygiene  Community Resource Referral / Chronic Care Management: CRR required this visit?  No   CCM required this visit?  No      Plan:     I have personally reviewed and noted the following in the patient's chart:   . Medical and social history . Use of alcohol, tobacco or illicit drugs  . Current medications and supplements . Functional ability and status . Nutritional status . Physical activity . Advanced directives . List of other physicians . Hospitalizations, surgeries, and ER visits in previous 12 months . Vitals . Screenings to include cognitive, depression, and falls . Referrals and appointments  In addition, I have reviewed and discussed with patient certain preventive protocols, quality metrics, and best practice recommendations. A written personalized care plan for preventive services as well as general preventive health recommendations were provided to patient.     Ofilia Neas, LPN   01/13/4080   Nurse Notes: None

## 2020-09-03 ENCOUNTER — Telehealth: Payer: Self-pay | Admitting: Internal Medicine

## 2020-09-03 ENCOUNTER — Other Ambulatory Visit: Payer: Self-pay | Admitting: Family Medicine

## 2020-09-03 DIAGNOSIS — I10 Essential (primary) hypertension: Secondary | ICD-10-CM

## 2020-09-03 NOTE — Telephone Encounter (Signed)
Pt scheduled to see Dr. Henrene Pastor 10/18/20@8 :20am. Left message for pt to call back.

## 2020-09-03 NOTE — Telephone Encounter (Signed)
Pt states he stopped taking budesonide in October. He has noticed that he is having diarrhea again and does not want to wind up back where he was before. Pt has not seen any blood in the stool. Reports some days he may have 3-4 diarrhea stools/day. Please advise.

## 2020-09-03 NOTE — Telephone Encounter (Signed)
Patient called requesting to speak with a nurse regarding some current GI symptoms and possible medication. Did not provide any further information

## 2020-09-03 NOTE — Telephone Encounter (Signed)
1.  Resume budesonide 9 mg daily for 1 month then reduce to 6 mg daily until his follow-up appointment. 2.  Follow-up appointment in about 6 weeks

## 2020-09-04 ENCOUNTER — Other Ambulatory Visit: Payer: Self-pay

## 2020-09-04 MED ORDER — BUDESONIDE 3 MG PO CPEP: ORAL_CAPSULE | ORAL | 3 refills | Status: DC

## 2020-09-04 NOTE — Telephone Encounter (Signed)
Pt aware, script sent to the pharmacy.

## 2020-09-20 ENCOUNTER — Other Ambulatory Visit: Payer: Self-pay | Admitting: Family Medicine

## 2020-09-20 DIAGNOSIS — I10 Essential (primary) hypertension: Secondary | ICD-10-CM

## 2020-10-18 ENCOUNTER — Ambulatory Visit: Payer: Medicare Other | Admitting: Internal Medicine

## 2020-10-31 ENCOUNTER — Telehealth: Payer: Self-pay | Admitting: Family Medicine

## 2020-10-31 NOTE — Progress Notes (Signed)
  Chronic Care Management   Note  10/31/2020 Name: Adam Villanueva MRN: [id number] DOB: [birth year]  Adam Villanueva is a 70 y.o. year old male who is a primary care patient of Burchette, Alinda Sierras, MD. I reached out to [name] by phone today in response to a referral sent by [name]'s PCP, Eulas Post, MD.   Mr. Train was given information about Chronic Care Management services today including:  1. CCM service includes personalized support from designated clinical staff supervised by his physician, including individualized plan of care and coordination with other care providers 2. 24/7 contact phone numbers for assistance for urgent and routine care needs. 3. Service will only be billed when office clinical staff spend 20 minutes or more in a month to coordinate care. 4. Only one practitioner may furnish and bill the service in a calendar month. 5. The patient may stop CCM services at any time (effective at the end of the month) by phone call to the office staff.   Patient agreed to services and verbal consent obtained.   Follow up plan:   Carley Perdue [employer] Scheduler

## 2020-11-07 DIAGNOSIS — H5213 Myopia, bilateral: Secondary | ICD-10-CM | POA: Diagnosis not present

## 2020-11-07 DIAGNOSIS — H25043 Posterior subcapsular polar age-related cataract, bilateral: Secondary | ICD-10-CM | POA: Diagnosis not present

## 2020-11-07 DIAGNOSIS — H2513 Age-related nuclear cataract, bilateral: Secondary | ICD-10-CM | POA: Diagnosis not present

## 2020-11-28 ENCOUNTER — Ambulatory Visit: Payer: Medicare Other | Admitting: Internal Medicine

## 2020-11-28 ENCOUNTER — Encounter: Payer: Self-pay | Admitting: Internal Medicine

## 2020-11-28 VITALS — BP 126/72 | HR 73 | Ht 70.0 in | Wt 188.0 lb

## 2020-11-28 DIAGNOSIS — R197 Diarrhea, unspecified: Secondary | ICD-10-CM | POA: Diagnosis not present

## 2020-11-28 DIAGNOSIS — K219 Gastro-esophageal reflux disease without esophagitis: Secondary | ICD-10-CM | POA: Diagnosis not present

## 2020-11-28 DIAGNOSIS — K52832 Lymphocytic colitis: Secondary | ICD-10-CM

## 2020-11-28 NOTE — Progress Notes (Signed)
HISTORY OF PRESENT ILLNESS:  Adam Villanueva is a 70 y.o. male with a history of lymphocytic colitis, GERD, sessile cecal hyperplastic polyp, and medical problems as listed below.  He was last seen in this office June 05, 2020.  At that time he was doing well on low-dose budesonide.  See that dictation.  Subsequently weaned off of budesonide and did well until mid December when he reported recurrent problems with persistent diarrhea.  He was reinitiated on budesonide.  Symptoms resolved in a few weeks.  Most recently has been on budesonide 3 mg for the past 2 to 3 weeks.  He is asymptomatic.  Continues on PPI intermittently for GERD.  His last colonoscopy was 2018.  No new complaints.  Review of blood work from July 2000 21 months normal hemoglobin 7.0.  He has completed his COVID vaccination series. Patient does report a new complaint of right-sided discomfort.  In the right axillary line.  Radiation to the right lower inguinal region.  This been going on for greater than 1 year.  May last an entire day.  Seems to be worse with certain types of movements or activity.  No weight loss.  No fevers.  REVIEW OF SYSTEMS:  All non-GI ROS negative unless otherwise stated in the HPI except for arthritis, back pain, headaches, fatigue, itching  Past Medical History:  Diagnosis Date  . Allergy   . Anxiety   . Arthritis   . Cataract   . Chronic insomnia   . GERD (gastroesophageal reflux disease)   . Hyperlipidemia   . Hypertension   . Lower back pain    and mid back pain  . Prostatitis   . Sleep apnea    no cpap    Past Surgical History:  Procedure Laterality Date  . COLONOSCOPY    . POLYPECTOMY    . SEPTOPLASTY     broken nose  . WISDOM TOOTH EXTRACTION      Social History RIVEN MABILE  reports that he has never smoked. He has never used smokeless tobacco. He reports current alcohol use. He reports that he does not use drugs.  family history includes Arthritis in his father,  maternal grandfather, maternal grandmother, mother, paternal grandfather, and paternal grandmother; Hyperlipidemia in his mother; Hypertension in his father and mother; Rectal cancer in his mother; Throat cancer in his brother.  No Known Allergies     PHYSICAL EXAMINATION: Vital signs: BP 126/72   Pulse 73   Ht 5\' 10"  (1.778 m)   Wt 188 lb (85.3 kg)   SpO2 99%   BMI 26.98 kg/m   Constitutional: generally well-appearing, no acute distress Psychiatric: alert and oriented x3, cooperative Eyes: extraocular movements intact, anicteric, conjunctiva pink Mouth: oral pharynx moist, no lesions Neck: supple no lymphadenopathy Cardiovascular: heart regular rate and rhythm, no murmur Lungs: clear to auscultation bilaterally Abdomen: soft, nontender, nondistended, no obvious ascites, no peritoneal signs, normal bowel sounds, no organomegaly Rectal: Omitted Extremities: no clubbing, cyanosis, or lower extremity edema bilaterally Skin: no lesions on visible extremities Neuro: No focal deficits.  Cranial nerves intact  ASSESSMENT:  1.  Lymphocytic colitis.  Relapse in December 2021 budesonide.  Retreatment with budesonide has been effective.  Currently on 3 mg daily 2.  Colonoscopy 2018 with sessile cecal hyperplastic polyp 3.  GERD.  On PPI 4.  Vague right-sided pain for greater than 1 year.  No worrisome features   PLAN:  1.  Continue on budesonide 3 mg daily for an additional  2 to 3 weeks, then stop. 2.  Schedule abdominal ultrasound to evaluate vague right-sided pain 3.  Routine colonoscopy around 2023 4.  Routine GI office follow-up about 1 year

## 2020-11-28 NOTE — Patient Instructions (Signed)
You will be contacted by [facility] in the next 2 days to arrange an ultrasound.  The number on your caller ID will be [phone], please answer when they call.  If you have not heard from them in 2 days please call [phone] to schedule.   Stay on Budesonide for 2-3 more weeks and then stop.  Please follow up with Dr. Henrene Pastor in the office in one year

## 2020-12-03 ENCOUNTER — Ambulatory Visit (HOSPITAL_COMMUNITY)
Admission: RE | Admit: 2020-12-03 | Discharge: 2020-12-03 | Disposition: A | Discharge status: Home / Self Care | Payer: Medicare Other | Source: Ambulatory Visit | Attending: Internal Medicine | Admitting: Internal Medicine

## 2020-12-03 ENCOUNTER — Other Ambulatory Visit: Payer: Self-pay

## 2020-12-03 DIAGNOSIS — K52832 Lymphocytic colitis: Secondary | ICD-10-CM | POA: Insufficient documentation

## 2020-12-03 DIAGNOSIS — R109 Unspecified abdominal pain: Secondary | ICD-10-CM | POA: Diagnosis not present

## 2020-12-23 ENCOUNTER — Ambulatory Visit: Payer: Medicare Other

## 2020-12-24 ENCOUNTER — Telehealth: Payer: Self-pay

## 2020-12-24 ENCOUNTER — Telehealth: Payer: Self-pay | Admitting: Pharmacist

## 2020-12-24 DIAGNOSIS — I10 Essential (primary) hypertension: Secondary | ICD-10-CM

## 2020-12-24 DIAGNOSIS — M159 Polyosteoarthritis, unspecified: Secondary | ICD-10-CM

## 2020-12-24 NOTE — Telephone Encounter (Signed)
-----   Message from Viona Gilmore, [facility] sent at 12/24/2020  4:42 PM EDT ----- Regarding: CCM referral Hi,  Can you please put in a CCM referral for Mr. Wile, one of Dr. Erick Blinks patients?  Thank you, Maddie

## 2020-12-24 NOTE — Chronic Care Management (AMB) (Signed)
    Chronic Care Management Pharmacy Assistant   Name: Adam Villanueva  MRN: [id number] DOB: January 15, 1951  Reason for Encounter: Initial Questions for Pharmacist visit on 12-25-2020  Patient Questions:   1. Have you seen any other providers since your last visit?  2. Any changes in your medications or health?  3. Any side effects from any medications?  a. Crestor 10 mg - was giving him some back pain 4. Do you have an symptoms or problems not managed by your medications?  a. Increasing back pain 5. Any concerns about your health right now? No 6. Has your provider asked that you check blood pressure, blood sugar, or follow special diet at home?  a. Checks his blood pressure once a week 7. Do you get any type of exercise on a regular basis?  a. He still part time  b. Walking 8. Can you think of a goal you would like to reach for your health?  a. Less back pain 9. Do you have any problems getting your medications? No 10. Is there anything that you would like to discuss during the appointment? No  The patient was asked to please bring medications, blood pressure/ blood sugar log, and supplements to his appointment.        Medications: Outpatient Encounter Medications as of 12/24/2020  Medication Sig  . budesonide (ENTOCORT EC) 3 MG 24 hr capsule Take 3 capsules by mouth daily for 1 month, then decrease to 2 capsules by mouth daily until office visit. (Patient taking differently: Take 3 mg by mouth daily. Take 3 capsules by mouth daily for 1 month, then decrease to 2 capsules by mouth daily until office visit.)  . cholecalciferol (VITAMIN D) 1000 units tablet Take 1,000 Units by mouth daily.  Marland Kitchen esomeprazole (NEXIUM) 20 MG capsule 20 mg. Uses only PRN for flare ups.  Marland Kitchen ibuprofen (ADVIL,MOTRIN) 200 MG tablet Take 400 mg by mouth every 6 (six) hours as needed (pain).  Marland Kitchen lisinopril (ZESTRIL) 40 MG tablet TAKE 1 TABLET BY MOUTH  DAILY  . metoprolol succinate (TOPROL-XL) 50 MG 24 hr tablet TAKE  1 TABLET BY MOUTH  DAILY WITH OR IMMEDIATELY  FOLLOWING A MEAL  . Multiple Vitamin (MULTIVITAMIN) tablet Take 1 tablet by mouth daily.  . Omega-3 Fatty Acids (OMEGA-3 FISH OIL PO) Take by mouth daily.   . pantoprazole (PROTONIX) 40 MG tablet TAKE 1 TABLET BY MOUTH  DAILY  . rosuvastatin (CRESTOR) 10 MG tablet Take 1 tablet (10 mg total) by mouth daily. (Patient taking differently: Take 10 mg by mouth as needed.)  . sildenafil (REVATIO) 20 MG tablet Take 2 to 5 tablets one hour prior to sexual activity.  . tamsulosin (FLOMAX) 0.4 MG CAPS capsule Take 1 capsule by mouth daily. (Patient not taking: No sig reported)  . Turmeric 400 MG CAPS Take by mouth as needed.  . vitamin C (ASCORBIC ACID) 500 MG tablet Take 500 mg by mouth daily.   No facility-administered encounter medications on file as of 12/24/2020.   Star Rating Drugs:  Dispensed Quantity Pharmacy  Rosuvastatin 10 mg 07.07.2020 90 [facility]    Amilia Revonda Standard, [place] [phone]

## 2020-12-24 NOTE — Progress Notes (Addendum)
Chronic Care Management Pharmacy Note  01/09/2021 Name:  Adam Villanueva MRN:  [id number] DOB:  [birth year]  Subjective: Adam Villanueva is an 70 y.o. year old male who is a primary patient of Burchette, Alinda Sierras, MD.  The CCM team was consulted for assistance with disease management and care coordination needs.    Engaged with patient face to face for initial visit in response to provider referral for pharmacy case management and/or care coordination services.   Consent to Services:  The patient was given the following information about Chronic Care Management services today, agreed to services, and gave verbal consent: 1. CCM service includes personalized support from designated clinical staff supervised by the primary care provider, including individualized plan of care and coordination with other care providers 2. 24/7 contact phone numbers for assistance for urgent and routine care needs. 3. Service will only be billed when office clinical staff spend 20 minutes or more in a month to coordinate care. 4. Only one practitioner may furnish and bill the service in a calendar month. 5.The patient may stop CCM services at any time (effective at the end of the month) by phone call to the office staff. 6. The patient will be responsible for cost sharing (co-pay) of up to 20% of the service fee (after annual deductible is met). Patient agreed to services and consent obtained.  Patient Care Team: Eulas Post, MD as PCP - General (Family Medicine) Viona Gilmore, [facility] as Pharmacist (Pharmacist)  Recent office visits: 06/27/20 Ofilia Neas, LPN: Patient presented for AWV.  04/26/20 Carolann Littler, MD: Patient presented for annual exam. Cholesterol elevated and recommended to restart rosuvastatin 10 mg daily. Administered Pneumovax.  Recent consult visits: 11/28/20 Scarlette Shorts, MD (gastro): Patient presented for lymphocytic colitis follow up. Continue budesonide 3 mg daily for 2-3 weeks then  stop.  Hospital visits: None in previous 6 months  Objective:  Lab Results  Component Value Date   CREATININE 0.92 04/26/2020   BUN 12 04/26/2020   GFR 78.36 04/04/2019   GFRNONAA 87 (L) 01/29/2014   GFRAA >90 01/29/2014   NA 137 04/26/2020   K 4.0 04/26/2020   CALCIUM 9.0 04/26/2020   CO2 26 04/26/2020   GLUCOSE 94 04/26/2020    Lab Results  Component Value Date/Time   HGBA1C 5.6 01/22/2016 10:38 AM   GFR 78.36 04/04/2019 10:22 AM   GFR 77.04 03/28/2018 09:13 AM    Last diabetic Eye exam: No results found for: HMDIABEYEEXA  Last diabetic Foot exam: No results found for: HMDIABFOOTEX   Lab Results  Component Value Date   CHOL 244 (H) 04/26/2020   HDL 35 (L) 04/26/2020   LDLCALC 163 (H) 04/26/2020   LDLDIRECT 78.0 04/04/2019   TRIG 296 (H) 04/26/2020   CHOLHDL 7.0 (H) 04/26/2020    Hepatic Function Latest Ref Rng & Units 04/26/2020 04/04/2019 03/28/2018  Total Protein 6.1 - 8.1 g/dL 6.8 6.9 6.7  Albumin 3.5 - 5.2 g/dL - 4.4 4.4  AST 10 - 35 U/L '20 17 21  ' ALT 9 - 46 U/L '22 18 21  ' Alk Phosphatase 39 - 117 U/L - 75 66  Total Bilirubin 0.2 - 1.2 mg/dL 0.6 0.6 0.6  Bilirubin, Direct 0.0 - 0.2 mg/dL 0.1 0.1 0.1    Lab Results  Component Value Date/Time   TSH 0.87 04/26/2020 09:34 AM   TSH 1.21 04/04/2019 10:22 AM    CBC Latest Ref Rng & Units 04/11/2020 04/04/2019 05/24/2015  WBC 4.0 - 10.5  K/uL 4.8 4.4 6.4  Hemoglobin 13.0 - 17.0 g/dL 17.0 16.4 16.0  Hematocrit 39.0 - 52.0 % 48.0 47.8 50.7  Platelets 150.0 - 400.0 K/uL 226.0 215.0 -    No results found for: VD25OH  Clinical ASCVD: No  The 10-year ASCVD risk score Mikey Bussing DC Jr., et al., 2013) is: 22.2%   Values used to calculate the score:     Age: 68 years     Sex: Male     Is Non-Hispanic African American: No     Diabetic: No     Tobacco smoker: No     Systolic Blood Pressure: 734 mmHg     Is BP treated: Yes     HDL Cholesterol: 35 mg/dL     Total Cholesterol: 244 mg/dL    Depression screen [facility] 2/9  06/27/2020 04/26/2020 04/04/2019  Decreased Interest 0 0 0  Down, Depressed, Hopeless 0 0 1  PHQ - 2 Score 0 0 1  Altered sleeping 0 - 0  Tired, decreased energy 0 - 0  Change in appetite 0 - 0  Feeling bad or failure about yourself  0 - 0  Trouble concentrating 0 - 0  Moving slowly or fidgety/restless 0 - 0  Suicidal thoughts 0 - 0  PHQ-9 Score 0 - 1  Difficult doing work/chores Not difficult at all - Not difficult at all      Social History   Tobacco Use  Smoking Status Never Smoker  Smokeless Tobacco Never Used   BP Readings from Last 3 Encounters:  11/28/20 126/72  06/05/20 132/70  04/26/20 126/74   Pulse Readings from Last 3 Encounters:  11/28/20 73  06/05/20 80  04/26/20 64   Wt Readings from Last 3 Encounters:  11/28/20 188 lb (85.3 kg)  06/05/20 189 lb 6 oz (85.9 kg)  04/26/20 184 lb 8 oz (83.7 kg)   BMI Readings from Last 3 Encounters:  11/28/20 26.98 kg/m  06/05/20 27.97 kg/m  04/26/20 27.25 kg/m    Assessment/Interventions: Review of patient past medical history, allergies, medications, health status, including review of consultants reports, laboratory and other test data, was performed as part of comprehensive evaluation and provision of chronic care management services.   SDOH:  (Social Determinants of Health) assessments and interventions performed: Yes SDOH Interventions   Flowsheet Row Most Recent Value  SDOH Interventions   Financial Strain Interventions Intervention Not Indicated     SDOH Screenings   Alcohol Screen: Low Risk   . Last Alcohol Screening Score (AUDIT): 1  Depression (PHQ2-9): Low Risk   . PHQ-2 Score: 0  Financial Resource Strain: Low Risk   . Difficulty of Paying Living Expenses: Not hard at all  Food Insecurity: No Food Insecurity  . Worried About Charity fundraiser in the Last Year: Never true  . Ran Out of Food in the Last Year: Never true  Housing: Low Risk   . Last Housing Risk Score: 0  Physical Activity:  Insufficiently Active  . Days of Exercise per Week: 3 days  . Minutes of Exercise per Session: 30 min  Social Connections: Socially Integrated  . Frequency of Communication with Friends and Family: Once a week  . Frequency of Social Gatherings with Friends and Family: More than three times a week  . Attends Religious Services: More than 4 times per year  . Active Member of Clubs or Organizations: Yes  . Attends Archivist Meetings: More than 4 times per year  . Marital Status: Married  Stress: No Stress Concern Present  . Feeling of Stress : Not at all  Tobacco Use: Low Risk   . Smoking Tobacco Use: Never Smoker  . Smokeless Tobacco Use: Never Used  Transportation Needs: No Transportation Needs  . Lack of Transportation (Medical): No  . 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  He lives with his wife and reported that he has gained some weight over the past couple of years. He admits that he is probably not eating as well as he should and his wife does the cooking. He usually eats sandwiches, chicken or burgers, and when his wife isn't working they have veggies and meat. He mostly eats chicken but some some fish and red meat.  Since the pandemic he admits to eating more sweets.  Patient was doing yoga prior to pandemic and lately he has been trying to do some walking. He walks about 2-2.5 miles about 3 days a week.  Patient reports he doesn't sleep consistently and stays up late and gets up early. He does feel drowsy after night and takes naps some days. He gets about 4 hours of sleep per night. He doesn't use an alarm clock and just wakes up naturally and reports this has been happening for a while. He also watches TV before bed and recommended limiting screen time.  Patient denies any problems with his  medications other than his rosuvastatin causing back pain. He stopped taking it due to this reason.  CCM Care Plan  No Known Allergies  Medications Reviewed Today    Reviewed by Viona Gilmore, [facility] (Pharmacist) on 12/25/20 at 1  Med List Status: <None>  Medication Order Taking? Sig Documenting Provider Last Dose Status Informant  cholecalciferol (VITAMIN D) 1000 units tablet [id number] Yes Take 1,000 Units by mouth daily. [provider] Taking Active   esomeprazole (NEXIUM) 20 MG capsule [id number] Yes 20 mg. Uses only PRN for flare ups. [provider] Taking Active   ibuprofen (ADVIL,MOTRIN) 200 MG tablet [id number] Yes Take 400 mg by mouth every 6 (six) hours as needed (pain). [provider] Taking Active Self  lisinopril (ZESTRIL) 40 MG tablet [id number] Yes TAKE 1 TABLET BY MOUTH  DAILY Burchette, Alinda Sierras, MD Taking Active   metoprolol succinate (TOPROL-XL) 50 MG 24 hr tablet [id number] Yes TAKE 1 TABLET BY MOUTH  DAILY WITH OR IMMEDIATELY  FOLLOWING A MEAL Burchette, Alinda Sierras, MD Taking Active   Multiple Vitamin (MULTIVITAMIN) tablet [id number] Yes Take 1 tablet by mouth daily. [provider] Taking Active   Omega-3 Fatty Acids (OMEGA-3 FISH OIL PO) [id number] Yes Take 2,400 mg by mouth in the morning and at bedtime. [provider] Taking Active   pantoprazole (PROTONIX) 40 MG tablet [id number] Yes TAKE 1 TABLET BY MOUTH  DAILY Burchette, Alinda Sierras, MD Taking Active   rosuvastatin (CRESTOR) 10 MG tablet [id number] No Take 1 tablet (10 mg total) by mouth daily.  Patient not taking: Reported on 12/25/2020   Eulas Post, MD Not Taking Active   sildenafil (REVATIO) 20 MG tablet [id number] Yes Take 2 to 5 tablets one hour prior to sexual activity. Eulas Post, MD Taking Active   Turmeric 400 MG CAPS [id number] Yes Take by mouth as needed. [provider] Taking Active   vitamin C (ASCORBIC ACID) 500 MG  tablet [id number] Yes Take 500  mg by mouth daily. [provider] Taking Active           Patient Active Problem List   Diagnosis Date Noted  . Osteoarthritis, multiple sites 05/26/2013  . Chronic insomnia 01/26/2012  . Hyperlipidemia 10/03/2010  . Essential hypertension 10/03/2010  . ESOPHAGITIS, REFLUX 10/03/2010  . GERD 10/03/2010  . NAUSEA 10/03/2010  . DYSPHAGIA UNSPECIFIED 10/03/2010  . DYSPHAGIA 10/03/2010  . ABDOMINAL PAIN, LEFT UPPER QUADRANT 10/03/2010  . ABDOMINAL PAIN-MULTIPLE SITES 10/03/2010    Immunization History  Administered Date(s) Administered  . Fluad Quad(high Dose 65+) 06/22/2019  . Influenza Split 08/01/2012  . Influenza,inj,Quad PF,6+ Mos 05/26/2013, 06/19/2014, 07/04/2015, 07/15/2016, 07/05/2017, 07/29/2018  . PFIZER(Purple Top)SARS-COV-2 Vaccination 11/21/2019, 12/12/2019  . Pneumococcal Conjugate-13 04/04/2019  . Pneumococcal Polysaccharide-23 04/26/2020  . Tdap 02/23/2013  . Zoster 02/28/2014    Conditions to be addressed/monitored:  Hypertension, Hyperlipidemia, GERD, Osteoarthritis, BPH and insomnia  Care Plan : [place]  Updates made by Viona Gilmore, [place] since 01/09/2021 12:00 AM    Problem: Problem: Hypertension, Hyperlipidemia, GERD, Osteoarthritis, BPH and insomnia     Long-Range Goal: Patient-Specific Goal   Start Date: 12/25/2020  Expected End Date: 12/25/2021  This Visit's Progress: On track  Priority: High  Note:   Current Barriers:  . Unable to independently monitor therapeutic efficacy . Unable to achieve control of cholesterol   Pharmacist Clinical Goal(s):  Marland Kitchen Patient will achieve adherence to monitoring guidelines and medication adherence to achieve therapeutic efficacy . achieve control of cholesterol as evidenced by next lipid panel  through collaboration with PharmD and provider.   Interventions: . 1:1 collaboration with Eulas Post, MD regarding development and update of comprehensive plan of care as evidenced by  provider attestation and co-signature . Inter-disciplinary care team collaboration (see longitudinal plan of care) . Comprehensive medication review performed; medication list updated in electronic medical record  Hypertension (BP goal <140/90) -Controlled -Current treatment: . Lisinopril 40 mg 1 tablet daily . Metoprolol succinate 50 mg 1 tablet daily -Medications previously tried: n/a  -Current home readings: could not provide readings -Current dietary habits: using salt more than he used to; recommended lower sodium salt -Current exercise habits: walking 3 days a week -Denies hypotensive/hypertensive symptoms -Educated on Daily salt intake goal < 2300 mg; Exercise goal of 150 minutes per week; Importance of home blood pressure monitoring; Proper BP monitoring technique; -Counseled to monitor BP at home weekly, document, and provide log at future appointments -Counseled on diet and exercise extensively Recommended to continue current medication  Hyperlipidemia: (LDL goal < 100) -Uncontrolled -Current treatment: . Fish oil daily 2400 mg 2 capsules daily -Medications previously tried: rosuvastatin (back pain), simvastatin  -Current dietary patterns: eats more fiber in the winter; uses olive oil for frying and sometimes uses butter -Current exercise habits: walking 3 days a week -Educated on Cholesterol goals;  Benefits of statin for ASCVD risk reduction; Importance of limiting foods high in cholesterol; Exercise goal of 150 minutes per week; -Counseled on diet and exercise extensively Recommended to continue current medication Collaborated with PCP about starting another cholesterol medication  GERD (Goal: minimize symptoms) -Uncontrolled -Current treatment  . Esomeprazole 20 mg 1 capsule as needed . Pantoprazole 40 mg 1 capsule every other days -Medications previously tried: none -Counseled on not using esomeprazole and pantoprazole on the same days Educated on  non-pharmacologic management of symptoms such as elevating the head of your bed, avoiding eating 2-3 hours before  bed, avoiding triggering foods such as acidic, spicy, or fatty foods, eating smaller meals, and wearing clothes that are loose around the waist  BPH (Goal: minimize symptoms) -Controlled -Current treatment  . tamsulosin 0.4 mg 1 capsule daily as needed -Medications previously tried: none  -Counseled on nature of tamsulosin and not to be used as an as needed medication  Osteoarthritis (Goal: minimize pain) -Not ideally controlled -Current treatment  . Ibuprofen 200 mg as needed . Aspirin every once in a while (BC powder) . Tylenol 500 mg as needed -Medications previously tried: n/a  -Recommended diclofenac (voltaren gel) for joint pain  Health Maintenance -Vaccine gaps: shingrix -Current therapy:  Marland Kitchen Vitamin D and calcium 1000 units 1 tablet daily . Multivitamin 1 tablet daily - not every day . Turmeric 400 mg 1 capsule as needed . Vitamin C 500 mg 1 tablet daily . Supplements: Livaplex, albaplex, betafood, cataplex, symplex, cyruta -Educated on Cost vs benefit of each product must be carefully weighed by individual consumer -Patient is satisfied with current therapy and denies issues -Recommended to continue current medication  Patient Goals/Self-Care Activities . Patient will:  - take medications as prescribed check blood pressure weekly, document, and provide at future appointments target a minimum of 150 minutes of moderate intensity exercise weekly engage in dietary modifications by adding more fiber to his diet to lower cholesterol  Follow Up Plan: Telephone follow up appointment with care management team member scheduled for: 4 months       Medication Assistance: None required.  Patient affirms current coverage meets needs.  Patient's preferred pharmacy is:  [facility] ([facility]) [place], [place] [address] Phone: [phone] Fax: [phone]  [facility], [place] - [address] [place] [address] Phone: [phone] Fax: [phone]  [facility], [place], [address] [place], [address] [place] [address] Phone: [phone] Fax: [phone]  [facility] [address], [address] [place] [address] Phone: [phone] Fax: [phone]  Uses pill box? Yes- weekly Pt endorses 99% compliance - once a month  We discussed: Current pharmacy is preferred with insurance plan and patient is satisfied with pharmacy services Patient decided to: Continue current medication management strategy  Care Plan and Follow Up Patient Decision:  Patient agrees to Care Plan and Follow-up.  Plan: Telephone follow up appointment with care management team member scheduled for:  4 months  Jeni Salles, PharmD [place] Pharmacist [place] [facility] [place] [phone]

## 2020-12-25 ENCOUNTER — Ambulatory Visit (INDEPENDENT_AMBULATORY_CARE_PROVIDER_SITE_OTHER): Payer: Medicare Other | Admitting: Pharmacist

## 2020-12-25 ENCOUNTER — Other Ambulatory Visit: Payer: Self-pay

## 2020-12-25 DIAGNOSIS — I10 Essential (primary) hypertension: Secondary | ICD-10-CM

## 2020-12-25 DIAGNOSIS — M159 Polyosteoarthritis, unspecified: Secondary | ICD-10-CM | POA: Diagnosis not present

## 2020-12-25 DIAGNOSIS — E785 Hyperlipidemia, unspecified: Secondary | ICD-10-CM | POA: Diagnosis not present

## 2020-12-26 ENCOUNTER — Ambulatory Visit: Payer: Medicare Other

## 2021-01-08 NOTE — Progress Notes (Incomplete)
Chronic Care Management Pharmacy Note  12/24/2020 Name:  Adam Villanueva MRN:  [id number] DOB:  [birth year]  Subjective: Adam Villanueva is an 70 y.o. year old male who is a primary patient of Burchette, Alinda Sierras, MD.  The CCM team was consulted for assistance with disease management and care coordination needs.    Engaged with patient face to face for initial visit in response to provider referral for pharmacy case management and/or care coordination services.   Consent to Services:  The patient was given the following information about Chronic Care Management services today, agreed to services, and gave verbal consent: 1. CCM service includes personalized support from designated clinical staff supervised by the primary care provider, including individualized plan of care and coordination with other care providers 2. 24/7 contact phone numbers for assistance for urgent and routine care needs. 3. Service will only be billed when office clinical staff spend 20 minutes or more in a month to coordinate care. 4. Only one practitioner may furnish and bill the service in a calendar month. 5.The patient may stop CCM services at any time (effective at the end of the month) by phone call to the office staff. 6. The patient will be responsible for cost sharing (co-pay) of up to 20% of the service fee (after annual deductible is met). Patient agreed to services and consent obtained.  Patient Care Team: Eulas Post, MD as PCP - General (Family Medicine) Viona Gilmore, [facility] as Pharmacist (Pharmacist)  Recent office visits: 06/27/20 Ofilia Neas, LPN: Patient presented for AWV.  04/26/20 Carolann Littler, MD: Patient presented for annual exam. Cholesterol elevated and recommended to restart rosuvastatin 10 mg daily. Administered Pneumovax.  Recent consult visits: 11/28/20 Scarlette Shorts, MD (gastro): Patient presented for lymphocytic colitis follow up. Continue budesonide 3 mg daily for 2-3 weeks then  stop.  Hospital visits: None in previous 6 months  Objective:  Lab Results  Component Value Date   CREATININE 0.92 04/26/2020   BUN 12 04/26/2020   GFR 78.36 04/04/2019   GFRNONAA 87 (L) 01/29/2014   GFRAA >90 01/29/2014   NA 137 04/26/2020   K 4.0 04/26/2020   CALCIUM 9.0 04/26/2020   CO2 26 04/26/2020   GLUCOSE 94 04/26/2020    Lab Results  Component Value Date/Time   HGBA1C 5.6 01/22/2016 10:38 AM   GFR 78.36 04/04/2019 10:22 AM   GFR 77.04 03/28/2018 09:13 AM    Last diabetic Eye exam: No results found for: HMDIABEYEEXA  Last diabetic Foot exam: No results found for: HMDIABFOOTEX   Lab Results  Component Value Date   CHOL 244 (H) 04/26/2020   HDL 35 (L) 04/26/2020   LDLCALC 163 (H) 04/26/2020   LDLDIRECT 78.0 04/04/2019   TRIG 296 (H) 04/26/2020   CHOLHDL 7.0 (H) 04/26/2020    Hepatic Function Latest Ref Rng & Units 04/26/2020 04/04/2019 03/28/2018  Total Protein 6.1 - 8.1 g/dL 6.8 6.9 6.7  Albumin 3.5 - 5.2 g/dL - 4.4 4.4  AST 10 - 35 U/L _0 ALT 9 - 46 U/L _1 Alk Phosphatase 39 - 117 U/L - 75 66  Total Bilirubin 0.2 - 1.2 mg/dL 0.6 0.6 0.6  Bilirubin, Direct 0.0 - 0.2 mg/dL 0.1 0.1 0.1    Lab Results  Component Value Date/Time   TSH 0.87 04/26/2020 09:34 AM   TSH 1.21 04/04/2019 10:22 AM    CBC Latest Ref Rng & Units 04/11/2020 04/04/2019 05/24/2015  WBC 4.0 - 10.5  K/uL 4.8 4.4 6.4  Hemoglobin 13.0 - 17.0 g/dL 17.0 16.4 16.0  Hematocrit 39.0 - 52.0 % 48.0 47.8 50.7  Platelets 150.0 - 400.0 K/uL 226.0 215.0 -    No results found for: VD25OH  Clinical ASCVD: No  The 10-year ASCVD risk score Mikey Bussing DC Jr., et al., 2013) is: 22.2%   Values used to calculate the score:     Age: 86 years     Sex: Male     Is Non-Hispanic African American: No     Diabetic: No     Tobacco smoker: No     Systolic Blood Pressure: 160 mmHg     Is BP treated: Yes     HDL Cholesterol: 35 mg/dL     Total Cholesterol: 244 mg/dL    Depression screen [facility] 2/9  06/27/2020 04/26/2020 04/04/2019  Decreased Interest 0 0 0  Down, Depressed, Hopeless 0 0 1  PHQ - 2 Score 0 0 1  Altered sleeping 0 - 0  Tired, decreased energy 0 - 0  Change in appetite 0 - 0  Feeling bad or failure about yourself  0 - 0  Trouble concentrating 0 - 0  Moving slowly or fidgety/restless 0 - 0  Suicidal thoughts 0 - 0  PHQ-9 Score 0 - 1  Difficult doing work/chores Not difficult at all - Not difficult at all      Social History   Tobacco Use  Smoking Status Never Smoker  Smokeless Tobacco Never Used   BP Readings from Last 3 Encounters:  11/28/20 126/72  06/05/20 132/70  04/26/20 126/74   Pulse Readings from Last 3 Encounters:  11/28/20 73  06/05/20 80  04/26/20 64   Wt Readings from Last 3 Encounters:  11/28/20 188 lb (85.3 kg)  06/05/20 189 lb 6 oz (85.9 kg)  04/26/20 184 lb 8 oz (83.7 kg)   BMI Readings from Last 3 Encounters:  11/28/20 26.98 kg/m  06/05/20 27.97 kg/m  04/26/20 27.25 kg/m    Assessment/Interventions: Review of patient past medical history, allergies, medications, health status, including review of consultants reports, laboratory and other test data, was performed as part of comprehensive evaluation and provision of chronic care management services.   SDOH:  (Social Determinants of Health) assessments and interventions performed: {yes/no:20286}  SDOH Screenings   Alcohol Screen: Low Risk   . Last Alcohol Screening Score (AUDIT): 1  Depression (PHQ2-9): Low Risk   . PHQ-2 Score: 0  Financial Resource Strain: Low Risk   . Difficulty of Paying Living Expenses: Not hard at all  Food Insecurity: No Food Insecurity  . Worried About Charity fundraiser in the Last Year: Never true  . Ran Out of Food in the Last Year: Never true  Housing: Low Risk   . Last Housing Risk Score: 0  Physical Activity: Insufficiently Active  . Days of Exercise per Week: 3 days  . Minutes of Exercise per Session: 30 min  Social Connections: Socially  Integrated  . Frequency of Communication with Friends and Family: Once a week  . Frequency of Social Gatherings with Friends and Family: More than three times a week  . Attends Religious Services: More than 4 times per year  . Active Member of Clubs or Organizations: Yes  . Attends Archivist Meetings: More than 4 times per year  . Marital Status: Married  Stress: No Stress Concern Present  . Feeling of Stress : Not at all  Tobacco Use: Low Risk   .  Smoking Tobacco Use: Never Smoker  . Smokeless Tobacco Use: Never Used  Transportation Needs: No Transportation Needs  . Lack of Transportation (Medical): No  . 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  Lives with wife - gained some weight over the past couple of years, probably not eating as well as he should, wife is workin part time and cooking, he makes sandwiches, Sports administrator, when she isnt working Nurse, learning disability - chicken mostly, some fish and red meat ;  Since the pandemic he eats more sweets  Exercise - was doing yoga prior to pandemic, trying to do some walking, 2-2.5 miles not every day 3 days a week, trying to get   Doesn't sleep consistently - stays up late and gets up early, feels drowsy after nights - naps some days; 4 hours of sleep; doesn't use an alarm clock; has been happening for a while; watches TV; back pain  Patient denies any problems with his medications other than his rosuvastatin causing back pain. He stopped taking it due to this reason.  CCM Care Plan  No Known Allergies  Medications Reviewed Today    Reviewed by Irene Shipper, MD (Physician) on 11/28/20 at 1223  Med List Status: <None>  Medication Order Taking? Sig Documenting Provider Last Dose  Status Informant  budesonide (ENTOCORT EC) 3 MG 24 hr capsule [id number] Yes Take 3 capsules by mouth daily for 1 month, then decrease to 2 capsules by mouth daily until office visit.  Patient taking differently: Take 3 mg by mouth daily. Take 3 capsules by mouth daily for 1 month, then decrease to 2 capsules by mouth daily until office visit.   Irene Shipper, MD Taking Active   cholecalciferol (VITAMIN D) 1000 units tablet [id number] Yes Take 1,000 Units by mouth daily. [provider] Taking Active   esomeprazole (NEXIUM) 20 MG capsule [id number] Yes 20 mg. Uses only PRN for flare ups. [provider] Taking Active   ibuprofen (ADVIL,MOTRIN) 200 MG tablet [id number] Yes Take 400 mg by mouth every 6 (six) hours as needed (pain). [provider] Taking Active Self  lisinopril (ZESTRIL) 40 MG tablet [id number] Yes TAKE 1 TABLET BY MOUTH  DAILY Burchette, Alinda Sierras, MD Taking Active   metoprolol succinate (TOPROL-XL) 50 MG 24 hr tablet [id number] Yes TAKE 1 TABLET BY MOUTH  DAILY WITH OR IMMEDIATELY  FOLLOWING A MEAL Burchette, Alinda Sierras, MD Taking Active   Multiple Vitamin (MULTIVITAMIN) tablet [id number] Yes Take 1 tablet by mouth daily. [provider] Taking Active   Omega-3 Fatty Acids (OMEGA-3 FISH OIL PO) [id number] Yes Take by mouth daily.  [provider] Taking Active   pantoprazole (PROTONIX) 40 MG tablet [id number] Yes TAKE 1 TABLET BY MOUTH  DAILY Burchette, Alinda Sierras, MD Taking Active   rosuvastatin (CRESTOR) 10 MG tablet [id number] Yes Take 1 tablet (10 mg total) by mouth daily.  Patient taking differently: Take 10 mg by mouth as needed.   Eulas Post, MD Taking Active   sildenafil (REVATIO) 20 MG tablet [id number] Yes Take 2 to 5 tablets one hour prior to sexual activity. Eulas Post, MD Taking Active   tamsulosin (FLOMAX) 0.4 MG CAPS capsule [id number] No Take 1  capsule by mouth daily.  Patient not taking: No sig reported   [provider] Not Taking Active   Turmeric 400 MG CAPS [id number] Yes Take by mouth as needed. [provider] Taking Active   vitamin C (ASCORBIC ACID) 500 MG tablet [id number] Yes Take 500 mg by mouth daily. [provider] Taking Active           Patient Active Problem List   Diagnosis Date Noted  . Osteoarthritis, multiple sites 05/26/2013  . Chronic insomnia 01/26/2012  . Hyperlipidemia 10/03/2010  . Essential hypertension 10/03/2010  . ESOPHAGITIS, REFLUX 10/03/2010  . GERD 10/03/2010  . NAUSEA 10/03/2010  . DYSPHAGIA UNSPECIFIED 10/03/2010  . DYSPHAGIA 10/03/2010  . ABDOMINAL PAIN, LEFT UPPER QUADRANT 10/03/2010  . ABDOMINAL PAIN-MULTIPLE SITES 10/03/2010    Immunization History  Administered Date(s) Administered  . Fluad Quad(high Dose 65+) 06/22/2019  . Influenza Split 08/01/2012  . Influenza,inj,Quad PF,6+ Mos 05/26/2013, 06/19/2014, 07/04/2015, 07/15/2016, 07/05/2017, 07/29/2018  . PFIZER(Purple Top)SARS-COV-2 Vaccination 11/21/2019, 12/12/2019  . Pneumococcal Conjugate-13 04/04/2019  . Pneumococcal Polysaccharide-23 04/26/2020  . Tdap 02/23/2013  . Zoster 02/28/2014    Conditions to be addressed/monitored:  Hypertension, Hyperlipidemia, GERD, Osteoarthritis, BPH and insomnia  There are no care plans that you recently modified to display for this patient.   Current Barriers:  . {pharmacybarriers:24917} . ***  Pharmacist Clinical Goal(s):  Marland Kitchen Patient will {PHARMACYGOALCHOICES:24921} through collaboration with PharmD and provider.  . ***  Interventions: . 1:1 collaboration with Eulas Post, MD regarding development and update of comprehensive plan of care as evidenced by provider attestation and co-signature . Inter-disciplinary care team collaboration (see longitudinal plan of care) . Comprehensive medication review performed; medication list updated in electronic medical record  BP Readings from Last 3 Encounters:  11/28/20  126/72  06/05/20 132/70  04/26/20 126/74     Hypertension (BP goal <140/90) -Controlled -Current treatment: . Lisinopril 40 mg 1 tablet daily . Metoprolol succinate 50 mg 1 tablet daily -Medications previously tried: ***   -Current home readings:  - wife checks it every now and then -Current dietary habits: *** - using more than he used, not tasting it as much - recommended lower salt -Current exercise habits: *** -{ACTIONS;DENIES/REPORTS:21021675::"Denies"} hypotensive/hypertensive symptoms -Educated on {CCM BP Counseling:25124} -Counseled to monitor BP at home ***, document, and provide log at future appointments -{CCMPHARMDINTERVENTION:25122}  Lab Results  Component Value Date   CHOL 244 (H) 04/26/2020   HDL 35 (L) 04/26/2020   LDLCALC 163 (H) 04/26/2020   LDLDIRECT 78.0 04/04/2019   TRIG 296 (H) 04/26/2020   CHOLHDL 7.0 (H) 04/26/2020    Hyperlipidemia: (LDL goal < 100) -Uncontrolled -Current treatment: . Rosuvastatin 10 mg daily - not taking daily?  Marland Kitchen Fish oil daily 2400 mg 2 capsules daily -Medications previously tried: simvastatin -Current dietary patterns: *** -Current exercise habits: *** -Educated on {CCM HLD Counseling:25126} -{CCMPHARMDINTERVENTION:25122}  Eats a lotcheerios of bananas; eating fiber in the winter -olive oil; sometimes butter -not every day  -pravastatin -  -vitamin D  GERD (Goal: ***) -{US controlled/uncontrolled:25276} -Current treatment  . Esomeprazole 20 mg 1 capsule as needed . Pantoprazole 40 mg 1 capsule every other days -Medications previously tried: ***  -{CCMPHARMDINTERVENTION:25122}  -gastroesophageal reflux disease -recommended not taking both in one day tums PRN  Non-pharmacologic management of symptoms such as elevating the head of your bed, avoiding eating 2-3 hours before bed, avoiding triggering foods such as acidic, spicy, or fatty foods, eating smaller meals, and wearing clothes that are  loose around the  waist  BPH (Goal: ***) -{US controlled/uncontrolled:25276} -Current treatment  . tamsulosin 0.4 mg 1 capsule daily PRN - using prn -Medications previously tried: ***  -{CCMPHARMDINTERVENTION:25122}  -try with food  ED (Goal: ***) -{US controlled/uncontrolled:25276} -Current treatment  . Sildenafil 20 mg 1 tablet prior to sexual activity -Medications previously tried: ***  -{CCMPHARMDINTERVENTION:25122}   Arthritis (Goal: ***) -{US controlled/uncontrolled:25276} -Current treatment  . Ibuprofen  . Aspirin every once in a while (BC powder) . Tylenol  -Medications previously tried: ***  -{CCMPHARMDINTERVENTION:25122}  -flank pain sometimes -constant pain  Diclofenac gel - Voltaren gel aspercreem    Vaccines   Reviewed and discussed patient's vaccination history.    Immunization History  Administered Date(s) Administered  . Fluad Quad(high Dose 65+) 06/22/2019  . Influenza Split 08/01/2012  . Influenza,inj,Quad PF,6+ Mos 05/26/2013, 06/19/2014, 07/04/2015, 07/15/2016, 07/05/2017, 07/29/2018  . PFIZER(Purple Top)SARS-COV-2 Vaccination 11/21/2019, 12/12/2019  . Pneumococcal Conjugate-13 04/04/2019  . Pneumococcal Polysaccharide-23 04/26/2020  . Tdap 02/23/2013  . Zoster 02/28/2014    Plan  Recommended patient receive *** vaccine in *** office.   Health Maintenance -Vaccine gaps: covid booster? Early fall;  Influenza? At [facility], shingrix -Current therapy:  Marland Kitchen Vitamin D and calcium 1000 units 1 tablet daily . Multivitamin 1 tablet daily - not every day . Turmeric 400 mg 1 capsule as needed . Vitamin C 500 mg 1 tablet daily -Educated on {ccm supplement counseling:25128} -{CCM Patient satisfied:25129} -{CCMPHARMDINTERVENTION:25122} Livaplex, albaplex, betafood, cataplex, symplex, cyruta  Patient Goals/Self-Care Activities . Patient will:  - {pharmacypatientgoals:24919}  Follow Up Plan: {CM FOLLOW UP SEGB:15176}   Medication Assistance:  {MEDASSISTANCEINFO:25044}  Patient's preferred pharmacy is:  [facility] [place], [address] [address] Phone: [phone] Fax: [phone]  [facility] [place], [address] [place] [address] Phone: [phone] Fax: [phone]  [place], [address], [address] [place], [place] [address] [address] Phone: [phone] Fax: [phone]  [facility] [address] [facility] [address] [place] [address] Phone: [phone] Fax: [phone]  Uses pill box? Yes- weekly Pt endorses 99% compliance - once a month  We discussed: {Pharmacy options:24294} Patient decided to: {US Pharmacy HALP:37902}  Care Plan and Follow Up Patient Decision:  {FOLLOWUP:24991}  Plan: {CM FOLLOW UP IOXB:35329}  Jeni Salles, PharmD [facility] [place]  4 months -

## 2021-01-09 NOTE — Patient Instructions (Addendum)
Hi Adam Villanueva,  It was great to get to meet you in person! Below is a summary of some of the topics we discussed.   Please reach out to me if you have any questions or need anything before our follow up!  Best, Maddie  Jeni Salles, PharmD, [facility]  Visit Information  Goals Addressed   None    Patient Care Plan: CCM Pharmacy Care Plan    Problem Identified: Problem: Hypertension, Hyperlipidemia, GERD, Osteoarthritis, BPH and insomnia     Long-Range Goal: Patient-Specific Goal   Start Date: 12/25/2020  Expected End Date: 12/25/2021  This Visit's Progress: On track  Priority: High  Note:   Current Barriers:  . Unable to independently monitor therapeutic efficacy . Unable to achieve control of cholesterol   Pharmacist Clinical Goal(s):  Marland Kitchen Patient will achieve adherence to monitoring guidelines and medication adherence to achieve therapeutic efficacy . achieve control of cholesterol as evidenced by next lipid panel  through collaboration with PharmD and provider.   Interventions: . 1:1 collaboration with Eulas Post, MD regarding development and update of comprehensive plan of care as evidenced by provider attestation and co-signature . Inter-disciplinary care team collaboration (see longitudinal plan of care) . Comprehensive medication review performed; medication list updated in electronic medical record  Hypertension (BP goal <140/90) -Controlled -Current treatment: . Lisinopril 40 mg 1 tablet daily . Metoprolol succinate 50 mg 1 tablet daily -Medications previously tried: n/a  -Current home readings: could not provide readings -Current dietary habits: using salt more than he used to; recommended lower sodium salt -Current exercise habits: walking 3 days a week -Denies hypotensive/hypertensive symptoms -Educated on Daily salt intake goal < 2300 mg; Exercise goal of 150 minutes per  week; Importance of home blood pressure monitoring; Proper BP monitoring technique; -Counseled to monitor BP at home weekly, document, and provide log at future appointments -Counseled on diet and exercise extensively Recommended to continue current medication  Hyperlipidemia: (LDL goal < 100) -Uncontrolled -Current treatment: . Fish oil daily 2400 mg 2 capsules daily -Medications previously tried: rosuvastatin (back pain), simvastatin  -Current dietary patterns: eats more fiber in the winter; uses olive oil for frying and sometimes uses butter -Current exercise habits: walking 3 days a week -Educated on Cholesterol goals;  Benefits of statin for ASCVD risk reduction; Importance of limiting foods high in cholesterol; Exercise goal of 150 minutes per week; -Counseled on diet and exercise extensively Recommended to continue current medication Collaborated with PCP about starting another cholesterol medication  GERD (Goal: minimize symptoms) -Uncontrolled -Current treatment  . Esomeprazole 20 mg 1 capsule as needed . Pantoprazole 40 mg 1 capsule every other days -Medications previously tried: none -Counseled on not using esomeprazole and pantoprazole on the same days Educated on non-pharmacologic management of symptoms such as elevating the head of your bed, avoiding eating 2-3 hours before bed, avoiding triggering foods such as acidic, spicy, or fatty foods, eating smaller meals, and wearing clothes that are loose around the waist  BPH (Goal: minimize symptoms) -Controlled -Current treatment  . tamsulosin 0.4 mg 1 capsule daily as needed -Medications previously tried: none  -Counseled on nature of tamsulosin and not to be used as an as needed medication  Osteoarthritis (Goal: minimize pain) -Not ideally controlled -Current treatment  . Ibuprofen 200 mg as needed . Aspirin every once in a while (BC powder) . Tylenol 500 mg as needed -Medications previously tried: n/a   -Recommended diclofenac (voltaren gel) for joint  pain  Health Maintenance -Vaccine gaps: shingrix -Current therapy:  Marland Kitchen Vitamin D and calcium 1000 units 1 tablet daily . Multivitamin 1 tablet daily - not every day . Turmeric 400 mg 1 capsule as needed . Vitamin C 500 mg 1 tablet daily . Supplements: Livaplex, albaplex, betafood, cataplex, symplex, cyruta -Educated on Cost vs benefit of each product must be carefully weighed by individual consumer -Patient is satisfied with current therapy and denies issues -Recommended to continue current medication  Patient Goals/Self-Care Activities . Patient will:  - take medications as prescribed check blood pressure weekly, document, and provide at future appointments target a minimum of 150 minutes of moderate intensity exercise weekly engage in dietary modifications by adding more fiber to his diet to lower cholesterol  Follow Up Plan: Telephone follow up appointment with care management team member scheduled for: 4 months      Mr. Walls was given information about Chronic Care Management services today including:  1. CCM service includes personalized support from designated clinical staff supervised by his physician, including individualized plan of care and coordination with other care providers 2. 24/7 contact phone numbers for assistance for urgent and routine care needs. 3. Standard insurance, coinsurance, copays and deductibles apply for chronic care management only during months in which we provide at least 20 minutes of these services. Most insurances cover these services at 100%, however patients may be responsible for any copay, coinsurance and/or deductible if applicable. This service may help you avoid the need for more expensive face-to-face services. 4. Only one practitioner may furnish and bill the service in a calendar month. 5. The patient may stop CCM services at any time (effective at the end of the month) by phone call to  the office staff.  Patient agreed to services and verbal consent obtained.   The patient verbalized understanding of instructions, educational materials, and care plan provided today and agreed to receive a mailed copy of patient instructions, educational materials, and care plan.  Telephone follow up appointment with pharmacy team member scheduled for:4 months  Viona Gilmore, [facility]  High Cholesterol  High cholesterol is a condition in which the blood has high levels of a white, waxy substance similar to fat (cholesterol). The liver makes all the cholesterol that the body needs. The human body needs small amounts of cholesterol to help build cells. A person gets extra or excess cholesterol from the food that he or she eats. The blood carries cholesterol from the liver to the rest of the body. If you have high cholesterol, deposits (plaques) may build up on the walls of your arteries. Arteries are the blood vessels that carry blood away from your heart. These plaques make the arteries narrow and stiff. Cholesterol plaques increase your risk for heart attack and stroke. Work with your health care provider to keep your cholesterol levels in a healthy range. What increases the risk? The following factors may make you more likely to develop this condition:  Eating foods that are high in animal fat (saturated fat) or cholesterol.  Being overweight.  Not getting enough exercise.  A family history of high cholesterol (familial hypercholesterolemia).  Use of tobacco products.  Having diabetes. What are the signs or symptoms? There are no symptoms of this condition. How is this diagnosed? This condition may be diagnosed based on the results of a blood test.  If you are older than 68 years of age, your health care provider may check your cholesterol levels every 4-6 years.  You may be checked more often if you have high cholesterol or other risk factors for heart disease. The blood test for  cholesterol measures:  "Bad" cholesterol, or LDL cholesterol. This is the main type of cholesterol that causes heart disease. The desired level is less than 100 mg/dL.  "Good" cholesterol, or HDL cholesterol. HDL helps protect against heart disease by cleaning the arteries and carrying the LDL to the liver for processing. The desired level for HDL is 60 mg/dL or higher.  Triglycerides. These are fats that your body can store or burn for energy. The desired level is less than 150 mg/dL.  Total cholesterol. This measures the total amount of cholesterol in your blood and includes LDL, HDL, and triglycerides. The desired level is less than 200 mg/dL. How is this treated? This condition may be treated with:  Diet changes. You may be asked to eat foods that have more fiber and less saturated fats or added sugar.  Lifestyle changes. These may include regular exercise, maintaining a healthy weight, and quitting use of tobacco products.  Medicines. These are given when diet and lifestyle changes have not worked. You may be prescribed a statin medicine to help lower your cholesterol levels. Follow these instructions at home: Eating and drinking  Eat a healthy, balanced diet. This diet includes: ? Daily servings of a variety of fresh, frozen, or canned fruits and vegetables. ? Daily servings of whole grain foods that are rich in fiber. ? Foods that are low in saturated fats and trans fats. These include poultry and fish without skin, lean cuts of meat, and low-fat dairy products. ? A variety of fish, especially oily fish that contain omega-3 fatty acids. Aim to eat fish at least 2 times a week.  Avoid foods and drinks that have added sugar.  Use healthy cooking methods, such as roasting, grilling, broiling, baking, poaching, steaming, and stir-frying. Do not fry your food except for stir-frying.   Lifestyle  Get regular exercise. Aim to exercise for a total of 150 minutes a week. Increase your  activity level by doing activities such as gardening, walking, and taking the stairs.  Do not use any products that contain nicotine or tobacco, such as cigarettes, e-cigarettes, and chewing tobacco. If you need help quitting, ask your health care provider.   General instructions  Take over-the-counter and prescription medicines only as told by your health care provider.  Keep all follow-up visits as told by your health care provider. This is important. Where to find more information  American Heart Association: www.heart.org  National Heart, Lung, and Blood Institute: https://wilson-eaton.com/ Contact a health care provider if:  You have trouble achieving or maintaining a healthy diet or weight.  You are starting an exercise program.  You are unable to stop smoking. Get help right away if:  You have chest pain.  You have trouble breathing.  You have any symptoms of a stroke. "BE FAST" is an easy way to remember the main warning signs of a stroke: ? B - Balance. Signs are dizziness, sudden trouble walking, or loss of balance. ? E - Eyes. Signs are trouble seeing or a sudden change in vision. ? F - Face. Signs are sudden weakness or numbness of the face, or the face or eyelid drooping on one side. ? A - Arms. Signs are weakness or numbness in an arm. This happens suddenly and usually on one side of the body. ? S - Speech. Signs are sudden trouble speaking, slurred speech,  or trouble understanding what people say. ? T - Time. Time to call emergency services. Write down what time symptoms started.  You have other signs of a stroke, such as: ? A sudden, severe headache with no known cause. ? Nausea or vomiting. ? Seizure. These symptoms may represent a serious problem that is an emergency. Do not wait to see if the symptoms will go away. Get medical help right away. Call your local emergency services (911 in the [place] Do not drive yourself to the hospital. Summary  Cholesterol plaques  increase your risk for heart attack and stroke. Work with your health care provider to keep your cholesterol levels in a healthy range.  Eat a healthy, balanced diet, get regular exercise, and maintain a healthy weight.  Do not use any products that contain nicotine or tobacco, such as cigarettes, e-cigarettes, and chewing tobacco.  Get help right away if you have any symptoms of a stroke. This information is not intended to replace advice given to you by your health care provider. Make sure you discuss any questions you have with your health care provider. Document Revised: 08/14/2019 Document Reviewed: 08/14/2019 Elsevier Patient Education  2021 [employer].

## 2021-01-12 ENCOUNTER — Other Ambulatory Visit: Payer: Self-pay | Admitting: Family Medicine

## 2021-01-12 MED ORDER — ATORVASTATIN CALCIUM 20 MG PO TABS: 20.0000 mg | ORAL_TABLET | Freq: Every day | ORAL | 3 refills | Status: DC

## 2021-01-15 ENCOUNTER — Telehealth: Payer: Self-pay | Admitting: Pharmacist

## 2021-01-15 DIAGNOSIS — E785 Hyperlipidemia, unspecified: Secondary | ICD-10-CM

## 2021-01-15 NOTE — Telephone Encounter (Signed)
Called patient to make him aware of the medication change to replace rosuvastatin. Patient aware to pick up atorvastatin from the pharmacy and will take daily.  Follow up lab appointment scheduled for June for repeat lipid panel and hepatic labs.

## 2021-02-10 ENCOUNTER — Telehealth: Payer: Self-pay | Admitting: Pharmacist

## 2021-02-10 NOTE — Chronic Care Management (AMB) (Signed)
Chronic Care Management Pharmacy Assistant   Name: Adam Villanueva  MRN: [id number] DOB: 09/15/1951   Reason for Encounter: Disease State/ Hyperlipidemia Assessment Call.    Conditions to be addressed/monitored: HLD   Recent office visits:  None.   Recent consult visits:  None.   Hospital visits:  None in previous 6 months  Medications: Outpatient Encounter Medications as of 02/10/2021  Medication Sig  . atorvastatin (LIPITOR) 20 MG tablet Take 1 tablet (20 mg total) by mouth daily.  . cholecalciferol (VITAMIN D) 1000 units tablet Take 1,000 Units by mouth daily.  Marland Kitchen esomeprazole (NEXIUM) 20 MG capsule 20 mg. Uses only PRN for flare ups.  Marland Kitchen ibuprofen (ADVIL,MOTRIN) 200 MG tablet Take 400 mg by mouth every 6 (six) hours as needed (pain).  Marland Kitchen lisinopril (ZESTRIL) 40 MG tablet TAKE 1 TABLET BY MOUTH  DAILY  . metoprolol succinate (TOPROL-XL) 50 MG 24 hr tablet TAKE 1 TABLET BY MOUTH  DAILY WITH OR IMMEDIATELY  FOLLOWING A MEAL  . Multiple Vitamin (MULTIVITAMIN) tablet Take 1 tablet by mouth daily.  . Omega-3 Fatty Acids (OMEGA-3 FISH OIL PO) Take 2,400 mg by mouth in the morning and at bedtime.  . pantoprazole (PROTONIX) 40 MG tablet TAKE 1 TABLET BY MOUTH  DAILY  . sildenafil (REVATIO) 20 MG tablet Take 2 to 5 tablets one hour prior to sexual activity.  . Turmeric 400 MG CAPS Take by mouth as needed.  . vitamin C (ASCORBIC ACID) 500 MG tablet Take 500 mg by mouth daily.   No facility-administered encounter medications on file as of 02/10/2021.    Comprehensive medication review performed; Spoke to patient regarding cholesterol  Lipid Panel    Component Value Date/Time   CHOL 244 (H) 04/26/2020 0934   TRIG 296 (H) 04/26/2020 0934   HDL 35 (L) 04/26/2020 0934   LDLCALC 163 (H) 04/26/2020 0934   LDLDIRECT 78.0 04/04/2019 1022    10-year ASCVD risk score: The 10-year ASCVD risk score [name], et al., 2013) is: 22.2%   Values used to calculate the score:     Age: 70  years     Sex: Male     Is Non-Hispanic African American: No     Diabetic: No     Tobacco smoker: No     Systolic Blood Pressure: 970 mmHg     Is BP treated: Yes     HDL Cholesterol: 35 mg/dL     Total Cholesterol: 244 mg/dL  . Current antihyperlipidemic regimen:  o Atorvastatin 20mg  - take once daily.  o Fish oil daily 2400mg  - 2 capsules daily.   . Previous antihyperlipidemic medications tried: rosuvastatin (back pain), simvastatin.  Marland Kitchen ASCVD risk enhancing conditions: age >85 and HTN  . What recent interventions/DTPs have been made by any provider to improve Cholesterol control since last CPP Visit: yes. CPP added atorvastatin.  . Any recent hospitalizations or ED visits since last visit with CPP? No  . What diet changes have been made to improve Cholesterol?  o Patient states he has been trying to eat better foods lower in cholesterol such as more vegetables and fruits.   . What exercise is being done to improve Cholesterol?  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 Patient is also able to do things around home.   Adherence Review: Does the patient have >5 day gap between last estimated fill dates? Yes  Medication Adherence:  Spoke with patient about the atorvastatin per Jeni Salles to see how he has been doing on it since it was sent to [facility]. Patient was not aware it was ever sent and has not started yet. Advised patient to pick medication up and start once daily. Patient verbalized understanding and will go pick up and start. Message sent to CPP.   Star Rating Drugs:  Atorvastatin 20mg  - no fill history in chart.  Lisinopril 40mg  - last filled on 07/05/20 [facility] at [facility] [phone]

## 2021-02-19 ENCOUNTER — Other Ambulatory Visit: Payer: Self-pay

## 2021-02-19 ENCOUNTER — Telehealth: Payer: Self-pay | Admitting: Internal Medicine

## 2021-02-19 MED ORDER — BUDESONIDE 3 MG PO CPEP: ORAL_CAPSULE | ORAL | 0 refills | Status: AC

## 2021-02-19 MED ORDER — BUDESONIDE 3 MG PO CPEP: ORAL_CAPSULE | ORAL | 0 refills | Status: DC

## 2021-02-19 NOTE — Telephone Encounter (Signed)
Chart reviewed.  History of lymphocytic colitis with good clinical response to budesonide, now seemingly with breakthrough symptoms titrating off.  Plan for the following:  -Start budesonide 9 mg daily for 12 weeks then slowly taper to 6 mg x 4 weeks, then 3 mg daily. Hold at 3 mg daily x6 months.  - Schedule follow-up in the GI clinic

## 2021-02-19 NOTE — Telephone Encounter (Signed)
Spoke with pt and he is aware. Script sent to pharmacy. Pt scheduled to see Dr. Henrene Pastor 04/08/21@2 :40pm. Pt aware.

## 2021-02-19 NOTE — Telephone Encounter (Signed)
Inbound call from patient stating they are starting to have the same issues that he as experiencing at his last appt.  Wants to know if they should come in or what can be done.  Please advise.

## 2021-02-19 NOTE — Telephone Encounter (Signed)
Adam Villanueva pt calling, h/o lymphocytic colitis. States he is having diarrhea again like he did in the past, reports it started about 2 weeks ago. He was treated previously with budesonide. Dr. Loletha Grayer as DOD please advise.

## 2021-02-25 ENCOUNTER — Telehealth: Payer: Self-pay | Admitting: Pharmacist

## 2021-02-25 NOTE — Chronic Care Management (AMB) (Signed)
    Chronic Care Management Pharmacy Assistant   Name: Adam Villanueva  MRN: [id number] DOB: 07-26-1951  Reason for Encounter: Medication Review/ Atorvastatin Medication Adherence.    Medications: Outpatient Encounter Medications as of 02/25/2021  Medication Sig  . atorvastatin (LIPITOR) 20 MG tablet Take 1 tablet (20 mg total) by mouth daily.  . budesonide (ENTOCORT EC) 3 MG 24 hr capsule Take 3 capsules (9 mg total) by mouth daily for 90 days, THEN 2 capsules (6 mg total) daily for 30 days, THEN 1 capsule (3 mg total) daily.  . cholecalciferol (VITAMIN D) 1000 units tablet Take 1,000 Units by mouth daily.  Marland Kitchen esomeprazole (NEXIUM) 20 MG capsule 20 mg. Uses only PRN for flare ups.  Marland Kitchen ibuprofen (ADVIL,MOTRIN) 200 MG tablet Take 400 mg by mouth every 6 (six) hours as needed (pain).  Marland Kitchen lisinopril (ZESTRIL) 40 MG tablet TAKE 1 TABLET BY MOUTH  DAILY  . metoprolol succinate (TOPROL-XL) 50 MG 24 hr tablet TAKE 1 TABLET BY MOUTH  DAILY WITH OR IMMEDIATELY  FOLLOWING A MEAL  . Multiple Vitamin (MULTIVITAMIN) tablet Take 1 tablet by mouth daily.  . Omega-3 Fatty Acids (OMEGA-3 FISH OIL PO) Take 2,400 mg by mouth in the morning and at bedtime.  . pantoprazole (PROTONIX) 40 MG tablet TAKE 1 TABLET BY MOUTH  DAILY  . sildenafil (REVATIO) 20 MG tablet Take 2 to 5 tablets one hour prior to sexual activity.  . Turmeric 400 MG CAPS Take by mouth as needed.  . vitamin C (ASCORBIC ACID) 500 MG tablet Take 500 mg by mouth daily.   No facility-administered encounter medications on file as of 02/25/2021.    Medication Adherence Notes:  Spoke with patient per Jeni Salles the clinical pharmacist for a follow up on patients Atorvastatin. Patient has since gotten his Atorvastatin medication from [facility] and has been taking for about two weeks now. Patient reports no issues such as muscle aches or anything unusual since starting medication. Reviewed patients other medications as well and patient  reports taking all others as prescribed and no issues at this time.  Patient thanked me for my call.    [place]  Clinical Pharmacist Assistant [phone]

## 2021-03-04 ENCOUNTER — Other Ambulatory Visit: Payer: Self-pay

## 2021-03-04 ENCOUNTER — Other Ambulatory Visit (INDEPENDENT_AMBULATORY_CARE_PROVIDER_SITE_OTHER): Payer: Medicare Other

## 2021-03-04 DIAGNOSIS — E785 Hyperlipidemia, unspecified: Secondary | ICD-10-CM | POA: Diagnosis not present

## 2021-03-04 LAB — LIPID PANEL
Cholesterol: 178 mg/dL (ref 0–200)
HDL: 36 mg/dL — ABNORMAL LOW (ref >39.00)
LDL Cholesterol: 114 mg/dL — ABNORMAL HIGH (ref 0–99)
NonHDL: 142.27
Total CHOL/HDL Ratio: 5
Triglycerides: 143 mg/dL (ref 0.0–149.0)
VLDL: 28.6 mg/dL (ref 0.0–40.0)

## 2021-03-04 LAB — HEPATIC FUNCTION PANEL
ALT: 17 U/L (ref 0–53)
AST: 16 U/L (ref 0–37)
Albumin: 4.2 g/dL (ref 3.5–5.2)
Alkaline Phosphatase: 69 U/L (ref 39–117)
Bilirubin, Direct: 0.1 mg/dL (ref 0.0–0.3)
Total Bilirubin: 0.6 mg/dL (ref 0.2–1.2)
Total Protein: 6.5 g/dL (ref 6.0–8.3)

## 2021-04-08 ENCOUNTER — Ambulatory Visit: Payer: Medicare Other | Admitting: Internal Medicine

## 2021-04-08 ENCOUNTER — Encounter: Payer: Self-pay | Admitting: Internal Medicine

## 2021-04-08 VITALS — BP 140/84 | HR 76 | Ht 70.0 in | Wt 187.0 lb

## 2021-04-08 DIAGNOSIS — K52832 Lymphocytic colitis: Secondary | ICD-10-CM | POA: Diagnosis not present

## 2021-04-08 DIAGNOSIS — K219 Gastro-esophageal reflux disease without esophagitis: Secondary | ICD-10-CM | POA: Diagnosis not present

## 2021-04-08 NOTE — Progress Notes (Signed)
HISTORY OF PRESENT ILLNESS:  Adam Villanueva is a 70 y.o. male with a history of GERD, sessile hyperplastic cecal polyp, and lymphocytic colitis.  He was last evaluated November 28, 2020.  See that dictation for details.  He completed budesonide taper about 2 weeks after that visit.  He did well until late May 2022 when he contacted the office with increased frequency of bowel movements.  He was concerned about relapse of his lymphocytic colitis.  My partner placed him back on budesonide 9 mg daily.  This follow-up arranged.  The patient tells me that in less than 1 week his diarrhea resolved.  He continues on budesonide 9 mg daily.  He is actually having issues with constipation at this point.  He is also noticed ecchymoses on the forearms with minor trauma.  Review of systems is otherwise negative.  His last colonoscopy was April 2018.  Blood work from March 04, 2021 shows normal liver tests  REVIEW OF SYSTEMS:  All non-GI ROS negative unless otherwise stated in HPI except for arthritis  Past Medical History:  Diagnosis Date   Allergy    Anxiety    Arthritis    Cataract    Chronic insomnia    GERD (gastroesophageal reflux disease)    Hyperlipidemia    Hypertension    Lower back pain    and mid back pain   Prostatitis    Sleep apnea    no cpap    Past Surgical History:  Procedure Laterality Date   COLONOSCOPY     POLYPECTOMY     SEPTOPLASTY     broken nose   WISDOM TOOTH EXTRACTION      Social History Adam Villanueva  reports that he has never smoked. He has never used smokeless tobacco. He reports current alcohol use. He reports that he does not use drugs.  family history includes Arthritis in his father, maternal grandfather, maternal grandmother, mother, paternal grandfather, and paternal grandmother; Hyperlipidemia in his mother; Hypertension in his father and mother; Rectal cancer in his mother; Throat cancer in his brother.  No Known Allergies     PHYSICAL  EXAMINATION: Vital signs: BP 140/84   Pulse 76   Ht 5\' 10"  (1.778 m)   Wt 187 lb (84.8 kg)   BMI 26.83 kg/m   Constitutional: generally well-appearing, no acute distress Psychiatric: alert and oriented x3, cooperative Eyes: extraocular movements intact, anicteric, conjunctiva pink Mouth: oral pharynx moist, no lesions Neck: supple no lymphadenopathy Cardiovascular: heart regular rate and rhythm, no murmur Lungs: clear to auscultation bilaterally Abdomen: soft, nontender, nondistended, no obvious ascites, no peritoneal signs, normal bowel sounds, no organomegaly Rectal: Omitted Extremities: no clubbing, cyanosis, or lower extremity edema bilaterally Skin: no lesions on visible extremities save ecchymoses on the forearms Neuro: No focal deficits.  Cranial nerves intact  ASSESSMENT:  1.  Lymphocytic colitis.  Possible recent relapse.  Quick resolution of symptoms shortly after initiating budesonide. 2.  GERD.  Asymptomatic on pantoprazole 3.  History of large hyperplastic cecal polyp 2018   PLAN:  1.  Taper budesonide.  Decrease budesonide to 6 mg daily for 1 month then 3 mg daily for 1 month then stop 2.  Reflux precautions 3.  Continue PPI 4.  Due for surveillance colonoscopy around April 2023 5.  Routine office follow-up 1 year.  Sooner if needed Total time of 30 minutes was spent preparing to see the patient, obtaining history, performing medically appropriate physical examination, counseling the patient regarding his above  listed conditions, directing medical therapy and medication taper, documenting clinical information in the health record

## 2021-04-08 NOTE — Patient Instructions (Signed)
If you are age 68 or older, your body mass index should be between 23-30. Your Body mass index is 26.83 kg/m. If this is out of the aforementioned range listed, please consider follow up with your Primary Care Provider.  If you are age 57 or younger, your body mass index should be between 19-25. Your Body mass index is 26.83 kg/m. If this is out of the aformentioned range listed, please consider follow up with your Primary Care Provider.   __________________________________________________________  The [facility] providers would like to encourage you to use [facility] to communicate with providers for non-urgent requests or questions.  Due to long hold times on the telephone, sending your provider a message by [facility] may be a faster and more efficient way to get a response.  Please allow 48 business hours for a response.  Please remember that this is for non-urgent requests.   Decrease your Budesonide to 6mg  a day (2 tablets) for one month and then take 3mg  (1 tablet) for a month, and then stop.  Please follow up in one year

## 2021-04-25 ENCOUNTER — Other Ambulatory Visit: Payer: Self-pay

## 2021-04-28 ENCOUNTER — Ambulatory Visit (INDEPENDENT_AMBULATORY_CARE_PROVIDER_SITE_OTHER): Payer: Medicare Other | Admitting: Family Medicine

## 2021-04-28 ENCOUNTER — Encounter: Payer: Self-pay | Admitting: Family Medicine

## 2021-04-28 ENCOUNTER — Telehealth: Payer: Self-pay | Admitting: Pharmacist

## 2021-04-28 ENCOUNTER — Other Ambulatory Visit: Payer: Self-pay

## 2021-04-28 VITALS — BP 130/72 | HR 68 | Temp 98.0°F | Ht 70.0 in | Wt 186.8 lb

## 2021-04-28 DIAGNOSIS — Z0000 Encounter for general adult medical examination without abnormal findings: Secondary | ICD-10-CM

## 2021-04-28 LAB — CBC WITH DIFFERENTIAL/PLATELET
Basophils Absolute: 0 10*3/uL (ref 0.0–0.1)
Basophils Relative: 0.9 % (ref 0.0–3.0)
Eosinophils Absolute: 0.2 10*3/uL (ref 0.0–0.7)
Eosinophils Relative: 4.4 % (ref 0.0–5.0)
HCT: 47.5 % (ref 39.0–52.0)
Hemoglobin: 16.7 g/dL (ref 13.0–17.0)
Lymphocytes Relative: 23.4 % (ref 12.0–46.0)
Lymphs Abs: 1.1 10*3/uL (ref 0.7–4.0)
MCHC: 35.1 g/dL (ref 30.0–36.0)
MCV: 91.8 fl (ref 78.0–100.0)
Monocytes Absolute: 0.5 10*3/uL (ref 0.1–1.0)
Monocytes Relative: 10 % (ref 3.0–12.0)
Neutro Abs: 2.9 10*3/uL (ref 1.4–7.7)
Neutrophils Relative %: 61.3 % (ref 43.0–77.0)
Platelets: 212 10*3/uL (ref 150.0–400.0)
RBC: 5.18 Mil/uL (ref 4.22–5.81)
RDW: 13.6 % (ref 11.5–15.5)
WBC: 4.7 10*3/uL (ref 4.0–10.5)

## 2021-04-28 LAB — BASIC METABOLIC PANEL
BUN: 11 mg/dL (ref 6–23)
CO2: 27 mEq/L (ref 19–32)
Calcium: 8.9 mg/dL (ref 8.4–10.5)
Chloride: 102 mEq/L (ref 96–112)
Creatinine, Ser: 1.04 mg/dL (ref 0.40–1.50)
GFR: 74 mL/min (ref >60.00)
Glucose, Bld: 92 mg/dL (ref 70–99)
Potassium: 4.2 mEq/L (ref 3.5–5.1)
Sodium: 138 mEq/L (ref 135–145)

## 2021-04-28 LAB — TSH: TSH: 1.36 u[IU]/mL (ref 0.35–5.50)

## 2021-04-28 LAB — PSA: PSA: 5.98 ng/mL — ABNORMAL HIGH (ref 0.10–4.00)

## 2021-04-28 NOTE — Progress Notes (Signed)
Established Patient Office Visit  Subjective:  Patient ID: Adam Villanueva, male    DOB: [birth year]  Age: 70 y.o. MRN: [id number]  CC:  Chief Complaint  Patient presents with   Annual Exam    No new concerns     HPI Adam Villanueva presents for physical exam.  He has history of hypertension, GERD, osteoarthritis, hyperlipidemia.  He had recent lipid and hepatic panel which were stable.  Has had high normal PSAs in the past and has seen urology.  No history of prostate cancer.  Health maintenance reviewed  -Gets yearly flu vaccines -Has had COVID vaccines with 1 booster -Pneumonia vaccines complete -Had Zostavax but no history of Shingrix vaccine -Tetanus due 2024 -Previous hepatitis C antibody negative -Colonoscopy due 2023  Social history-is married has been married for 5 years.  Had a stepson from first marriage.  Retired.  He worked with a Geophysicist/field seismologist center for 43 years.  No history of nicotine use.  No regular alcohol use.  Spends a lot of his time now volunteering with the church.  Family history-mother had rheumatoid arthritis.  She also had hyperlipidemia and hypertension.  Mother had rectal cancer.  Father died of complications of Alzheimer's disease in his late 74s.  He also had hypertension history.  1 brother who died of complications of "throat "cancer.  Sounds like this was oropharyngeal cancer.  Past Medical History:  Diagnosis Date   Allergy    Anxiety    Arthritis    Cataract    Chronic insomnia    GERD (gastroesophageal reflux disease)    Hyperlipidemia    Hypertension    Lower back pain    and mid back pain   Prostatitis    Sleep apnea    no cpap    Past Surgical History:  Procedure Laterality Date   COLONOSCOPY     POLYPECTOMY     SEPTOPLASTY     broken nose   WISDOM TOOTH EXTRACTION      Family History  Problem Relation Age of Onset   Arthritis Mother        rheumatoid   Hyperlipidemia Mother    Hypertension Mother     Rectal cancer Mother        16 years   Arthritis Father    Hypertension Father    Alzheimer's disease Father    Throat cancer Brother    Arthritis Maternal Grandmother    Arthritis Maternal Grandfather    Arthritis Paternal Grandmother    Arthritis Paternal Grandfather    Colon polyps Neg Hx    Esophageal cancer Neg Hx    Stomach cancer Neg Hx     Social History   Socioeconomic History   Marital status: Divorced    Spouse name: Not on file   Number of children: Not on file   Years of education: Not on file   Highest education level: Not on file  Occupational History   Not on file  Tobacco Use   Smoking status: Never   Smokeless tobacco: Never  Vaping Use   Vaping Use: Never used  Substance and Sexual Activity   Alcohol use: Yes    Alcohol/week: 0.0 standard drinks    Comment: occ beer   Drug use: No   Sexual activity: Yes    Partners: Female  Other Topics Concern   Not on file  Social History Narrative   Not on file   Social Determinants of Health  Financial Resource Strain: Low Risk    Difficulty of Paying Living Expenses: Not hard at all  Food Insecurity: No Food Insecurity   Worried About Charity fundraiser in the Last Year: Never true   Ran Out of Food in the Last Year: Never true  Transportation Needs: No Transportation Needs   Lack of Transportation (Medical): No   Lack of Transportation (Non-Medical): No  Physical Activity: Insufficiently Active   Days of Exercise per Week: 3 days   Minutes of Exercise per Session: 30 min  Stress: No Stress Concern Present   Feeling of Stress : Not at all  Social Connections: Socially Integrated   Frequency of Communication with Friends and Family: Once a week   Frequency of Social Gatherings with Friends and Family: More than three times a week   Attends Religious Services: More than 4 times per year   Active Member of [employer] or Organizations: Yes   Attends Music therapist: More than 4 times per  year   Marital Status: Married  Human resources officer Violence: Not At Risk   Fear of Current or Ex-Partner: No   Emotionally Abused: No   Physically Abused: No   Sexually Abused: No    Outpatient Medications Prior to Visit  Medication Sig Dispense Refill   atorvastatin (LIPITOR) 20 MG tablet Take 1 tablet (20 mg total) by mouth daily. 90 tablet 3   budesonide (ENTOCORT EC) 3 MG 24 hr capsule Take 3 capsules (9 mg total) by mouth daily for 90 days, THEN 2 capsules (6 mg total) daily for 30 days, THEN 1 capsule (3 mg total) daily. 510 capsule 0   cholecalciferol (VITAMIN D) 1000 units tablet Take 1,000 Units by mouth daily.     esomeprazole (NEXIUM) 20 MG capsule 20 mg. Uses only PRN for flare ups.     ibuprofen (ADVIL,MOTRIN) 200 MG tablet Take 400 mg by mouth every 6 (six) hours as needed (pain).     lisinopril (ZESTRIL) 40 MG tablet TAKE 1 TABLET BY MOUTH  DAILY 90 tablet 3   metoprolol succinate (TOPROL-XL) 50 MG 24 hr tablet TAKE 1 TABLET BY MOUTH  DAILY WITH OR IMMEDIATELY  FOLLOWING A MEAL 90 tablet 3   Multiple Vitamin (MULTIVITAMIN) tablet Take 1 tablet by mouth daily.     Omega-3 Fatty Acids (OMEGA-3 FISH OIL PO) Take 2,400 mg by mouth in the morning and at bedtime.     pantoprazole (PROTONIX) 40 MG tablet TAKE 1 TABLET BY MOUTH  DAILY 90 tablet 3   sildenafil (REVATIO) 20 MG tablet Take 2 to 5 tablets one hour prior to sexual activity. 50 tablet 5   Turmeric 400 MG CAPS Take by mouth as needed.     vitamin C (ASCORBIC ACID) 500 MG tablet Take 500 mg by mouth daily.     No facility-administered medications prior to visit.    No Known Allergies  ROS Review of Systems  Constitutional:  Negative for activity change, appetite change and fever.  Respiratory:  Negative for cough and shortness of breath.   Cardiovascular:  Negative for chest pain and leg swelling.  Gastrointestinal:  Negative for abdominal pain and vomiting.  Endocrine: Negative for polydipsia and polyuria.   Genitourinary:  Negative for dysuria, flank pain and hematuria.  Musculoskeletal:  Positive for back pain. Negative for joint swelling.  Neurological:  Negative for weakness and numbness.     Objective:    Physical Exam Constitutional:      General: He  is not in acute distress.    Appearance: He is well-developed.  HENT:     Head: Normocephalic and atraumatic.     Right Ear: External ear normal.     Left Ear: External ear normal.  Eyes:     Conjunctiva/sclera: Conjunctivae normal.     Pupils: Pupils are equal, round, and reactive to light.  Neck:     Thyroid: No thyromegaly.  Cardiovascular:     Rate and Rhythm: Normal rate and regular rhythm.     Heart sounds: Normal heart sounds. No murmur heard. Pulmonary:     Effort: No respiratory distress.     Breath sounds: No wheezing or rales.  Abdominal:     General: Bowel sounds are normal. There is no distension.     Palpations: Abdomen is soft. There is no mass.     Tenderness: There is no abdominal tenderness. There is no guarding or rebound.  Musculoskeletal:     Cervical back: Normal range of motion and neck supple.     Right lower leg: No edema.     Left lower leg: No edema.  Lymphadenopathy:     Cervical: No cervical adenopathy.  Skin:    Findings: No rash.     Comments: Does have several small petechiae both forearms.  He is dealt with this frequently in the past.  No other bruises noted.  Neurological:     Mental Status: He is alert and oriented to person, place, and time.     Cranial Nerves: No cranial nerve deficit.     Deep Tendon Reflexes: Reflexes normal.    BP 130/72 (BP Location: Left Arm, Patient Position: Sitting, Cuff Size: Normal)   Pulse 68   Temp 98 F (36.7 C) (Oral)   Ht '5\' 10"'$  (1.778 m)   Wt 186 lb 12.8 oz (84.7 kg)   SpO2 98%   BMI 26.80 kg/m  Wt Readings from Last 3 Encounters:  04/28/21 186 lb 12.8 oz (84.7 kg)  04/08/21 187 lb (84.8 kg)  11/28/20 188 lb (85.3 kg)     Health  Maintenance Due  Topic Date Due   Zoster Vaccines- Shingrix (1 of 2) Never done   COVID-19 Vaccine (3 - Pfizer risk series) 01/09/2020   INFLUENZA VACCINE  04/28/2021    There are no preventive care reminders to display for this patient.  Lab Results  Component Value Date   TSH 0.87 04/26/2020   Lab Results  Component Value Date   WBC 4.8 04/11/2020   HGB 17.0 04/11/2020   HCT 48.0 04/11/2020   MCV 90.1 04/11/2020   PLT 226.0 04/11/2020   Lab Results  Component Value Date   NA 137 04/26/2020   K 4.0 04/26/2020   CO2 26 04/26/2020   GLUCOSE 94 04/26/2020   BUN 12 04/26/2020   CREATININE 0.92 04/26/2020   BILITOT 0.6 03/04/2021   ALKPHOS 69 03/04/2021   AST 16 03/04/2021   ALT 17 03/04/2021   PROT 6.5 03/04/2021   ALBUMIN 4.2 03/04/2021   CALCIUM 9.0 04/26/2020   GFR 78.36 04/04/2019   Lab Results  Component Value Date   CHOL 178 03/04/2021   Lab Results  Component Value Date   HDL 36.00 (L) 03/04/2021   Lab Results  Component Value Date   LDLCALC 114 (H) 03/04/2021   Lab Results  Component Value Date   TRIG 143.0 03/04/2021   Lab Results  Component Value Date   CHOLHDL 5 03/04/2021   Lab Results  Component Value Date   HGBA1C 5.6 01/22/2016      Assessment & Plan:   Problem List Items Addressed This Visit   None Visit Diagnoses     Physical exam    -  Primary   Relevant Orders   Basic metabolic panel   TSH   CBC with Differential/Platelet   PSA     Discussed several health maintenance issues as follows  -Recommend annual flu vaccine -Recommend Shingrix vaccine and he will check on insurance coverage -repeat colonoscopy next year -Pneumonia vaccines complete -He is contemplating whether to get another COVID booster vaccine. -Obtain lab work minus lipid and hepatic which were done in June.   No orders of the defined types were placed in this encounter.   Follow-up: No follow-ups on file.    Carolann Littler, MD

## 2021-04-28 NOTE — Progress Notes (Signed)
    Chronic Care Management Pharmacy Assistant   Name: Adam Villanueva  MRN: [id number] DOB: Apr 17, 1951  Call to patient to reschedule follow up call for 04/29/2021 per Jeni Salles. Patient agreed to reschedule for 06/24/2021 at [place] Pharmacist Assistant [phone]

## 2021-04-29 ENCOUNTER — Telehealth: Payer: Medicare Other

## 2021-05-11 ENCOUNTER — Other Ambulatory Visit: Payer: Self-pay | Admitting: Family Medicine

## 2021-05-11 DIAGNOSIS — I10 Essential (primary) hypertension: Secondary | ICD-10-CM

## 2021-05-12 ENCOUNTER — Encounter: Payer: Self-pay | Admitting: Family Medicine

## 2021-05-12 ENCOUNTER — Other Ambulatory Visit: Payer: Self-pay

## 2021-05-12 ENCOUNTER — Ambulatory Visit (INDEPENDENT_AMBULATORY_CARE_PROVIDER_SITE_OTHER): Payer: Medicare Other | Admitting: Family Medicine

## 2021-05-12 VITALS — BP 112/68 | HR 83 | Temp 98.0°F | Wt 189.4 lb

## 2021-05-12 DIAGNOSIS — K645 Perianal venous thrombosis: Secondary | ICD-10-CM | POA: Diagnosis not present

## 2021-05-12 DIAGNOSIS — B372 Candidiasis of skin and nail: Secondary | ICD-10-CM | POA: Diagnosis not present

## 2021-05-12 MED ORDER — NYSTATIN 100000 UNIT/GM EX CREA: TOPICAL_CREAM | CUTANEOUS | 0 refills | Status: DC

## 2021-05-12 MED ORDER — HYDROCORTISONE ACETATE 25 MG RE SUPP: 25.0000 mg | Freq: Two times a day (BID) | RECTAL | 0 refills | Status: DC

## 2021-05-12 NOTE — Progress Notes (Signed)
Subjective:    Patient ID: Adam Villanueva, male    DOB: 1951-09-13, 70 y.o.   MRN: [id number]  Chief Complaint  Patient presents with   Pain    Pain in rectum, noticed it 4 days ago, is getting worse. Mostly constant, but eases off at times.    HPI Patient was seen today for acute concern.  Pt with rectal pain  and possible hemorrhoid x 4 days.  Pt tried OTC preparation H cream and soaking.  Pt endorses blood on toilet paper this am.  Denies dizziness, palpitations, weakness, constipation, diarrhea.  Past Medical History:  Diagnosis Date   Allergy    Anxiety    Arthritis    Cataract    Chronic insomnia    GERD (gastroesophageal reflux disease)    Hyperlipidemia    Hypertension    Lower back pain    and mid back pain   Prostatitis    Sleep apnea    no cpap    No Known Allergies  ROS General: Denies fever, chills, night sweats, changes in weight, changes in appetite HEENT: Denies headaches, ear pain, changes in vision, rhinorrhea, sore throat CV: Denies CP, palpitations, SOB, orthopnea Pulm: Denies SOB, cough, wheezing GI: Denies abdominal pain, nausea, vomiting, diarrhea, constipation  +rectal pain, hemorrhoid, blood on toilet paper GU: Denies dysuria, hematuria, frequency  Msk: Denies muscle cramps, joint pains Neuro: Denies weakness, numbness, tingling Skin: Denies rashes, bruising Psych: Denies depression, anxiety, hallucinations     Objective:    Blood pressure 112/68, pulse 83, temperature 98 F (36.7 C), temperature source Oral, weight 189 lb 6.4 oz (85.9 kg), SpO2 97 %.  Gen. Pleasant, well-nourished, in no distress, appears uncomfortable sitting in chair, normal affect   HEENT: [place]/AT, face symmetric, conjunctiva clear, no scleral icterus, PERRLA, EOMI, nares patent without drainage Lungs: no accessory muscle use Cardiovascular: RRR, no peripheral edema Abdomen: BS present, soft, NT/ND GU: erythematous skin of upper gluteal cleft with satellite lesions  present and small midline fissure.  A large firm external hemorrhoid with erythema noted.  Small internal hemorrhoid noted.  Normal rectal tone.  No stool in rectal vault.  Hemoccult negative. Musculoskeletal: No deformities, no cyanosis or clubbing, normal tone Neuro:  A&Ox3, CN II-XII intact, normal gait Skin:  Warm, dry, intact.  Petechiae on bilateral forearms   Wt Readings from Last 3 Encounters:  05/12/21 189 lb 6.4 oz (85.9 kg)  04/28/21 186 lb 12.8 oz (84.7 kg)  04/08/21 187 lb (84.8 kg)    Lab Results  Component Value Date   WBC 4.7 04/28/2021   HGB 16.7 04/28/2021   HCT 47.5 04/28/2021   PLT 212.0 04/28/2021   GLUCOSE 92 04/28/2021   CHOL 178 03/04/2021   TRIG 143.0 03/04/2021   HDL 36.00 (L) 03/04/2021   LDLDIRECT 78.0 04/04/2019   LDLCALC 114 (H) 03/04/2021   ALT 17 03/04/2021   AST 16 03/04/2021   NA 138 04/28/2021   K 4.2 04/28/2021   CL 102 04/28/2021   CREATININE 1.04 04/28/2021   BUN 11 04/28/2021   CO2 27 04/28/2021   TSH 1.36 04/28/2021   PSA 5.98 (H) 04/28/2021   INR 1.0 04/11/2020   HGBA1C 5.6 01/22/2016    Assessment/Plan:  Thrombosed external hemorrhoid  -Continue Epsom salt soak -Suppository -Follow-up with GI - Plan: Ambulatory referral to Gastroenterology, hydrocortisone (ANUSOL-HC) 25 MG suppository  Yeast dermatitis  - Plan: nystatin cream (MYCOSTATIN)  F/u as needed  Grier Mitts, MD

## 2021-05-14 ENCOUNTER — Ambulatory Visit: Payer: Medicare Other | Admitting: Physician Assistant

## 2021-05-14 ENCOUNTER — Ambulatory Visit
Admission: EM | Admit: 2021-05-14 | Discharge: 2021-05-14 | Discharge status: Absent without leave | Payer: Medicare Other

## 2021-05-14 ENCOUNTER — Telehealth: Payer: Self-pay | Admitting: Internal Medicine

## 2021-05-14 NOTE — Telephone Encounter (Signed)
Pt states he has a hemorrhoid that is very painful and swollen. Reports his PCP prescribed supp but they have not helped. He has been soaking in a warm tub. Discussed with pt that I could get him in with Dr. Henrene Pastor 8/24. Pt states he will try PCP or urgent care that he cannot wait that long. Reports last time he "had to be cut on" for the hemorrhoid. Pt did not want to schedule appt.

## 2021-05-14 NOTE — Telephone Encounter (Signed)
Pls call pt. He would like to take appt on Wednesday.

## 2021-05-14 NOTE — Telephone Encounter (Signed)
Pt scheduled to see Dr. Henrene Pastor 05/21/21 at 3:40pm. Pt aware of appt.

## 2021-05-21 ENCOUNTER — Ambulatory Visit: Payer: Medicare Other | Admitting: Internal Medicine

## 2021-05-21 ENCOUNTER — Encounter: Payer: Self-pay | Admitting: Internal Medicine

## 2021-05-21 VITALS — BP 112/70 | HR 88 | Ht 70.0 in | Wt 190.4 lb

## 2021-05-21 DIAGNOSIS — K219 Gastro-esophageal reflux disease without esophagitis: Secondary | ICD-10-CM | POA: Diagnosis not present

## 2021-05-21 DIAGNOSIS — K645 Perianal venous thrombosis: Secondary | ICD-10-CM | POA: Diagnosis not present

## 2021-05-21 DIAGNOSIS — K52832 Lymphocytic colitis: Secondary | ICD-10-CM

## 2021-05-21 NOTE — Progress Notes (Signed)
HISTORY OF PRESENT ILLNESS:  Adam Villanueva is a 70 y.o. male with lymphocytic colitis, GERD, and history of large hyperplastic cecal polyp who presents today upon referral from his primary provider regarding rectal pain and rectal bleeding.  He was evaluated by Dr. Volanda Napoleon May 12, 2021 and was diagnosed with thrombosed external hemorrhoid and small anal fissure.  I last saw the patient April 08, 2021.  At that time he was instructed to taper his budesonide.  He is currently on 3 mg daily.  He continues with regular bowel habits.  About 2 weeks ago he developed severe rectal pain.  Constant.  With or without defecation.  This lasted for about 1 week.  Subsequently developed bleeding with relief of pain.  He was prescribed hydrocortisone suppositories.  Feeling better with minimal discomfort at this time.  REVIEW OF SYSTEMS:  All non-GI ROS negative unless otherwise stated in the HPI except for arthritis  Past Medical History:  Diagnosis Date   Allergy    Anxiety    Arthritis    Cataract    Chronic insomnia    GERD (gastroesophageal reflux disease)    Hyperlipidemia    Hypertension    Lower back pain    and mid back pain   Prostatitis    Sleep apnea    no cpap    Past Surgical History:  Procedure Laterality Date   COLONOSCOPY     POLYPECTOMY     SEPTOPLASTY     broken nose   WISDOM TOOTH EXTRACTION      Social History Adam Villanueva  reports that he has never smoked. He has never used smokeless tobacco. He reports current alcohol use. He reports that he does not use drugs.  family history includes Alzheimer's disease in his father; Arthritis in his father, maternal grandfather, maternal grandmother, mother, paternal grandfather, and paternal grandmother; Hyperlipidemia in his mother; Hypertension in his father and mother; Rectal cancer in his mother; Throat cancer in his brother.  No Known Allergies     PHYSICAL EXAMINATION: Vital signs: BP 112/70 (BP Location: Left Arm,  Patient Position: Sitting, Cuff Size: Normal)   Pulse 88   Ht '5\' 10"'$  (1.778 m)   Wt 190 lb 6 oz (86.4 kg)   SpO2 97%   BMI 27.32 kg/m   Constitutional: generally well-appearing, no acute distress Psychiatric: alert and oriented x3, cooperative Eyes: Anicteric Mouth: Mask neck: supple no lymphadenopathy Abdomen: Not reexamined  Rectal: Enlarged external hemorrhoid that appears to have been thrombosed with superficial ulceration suggesting spontaneous decompression Neuro: No obvious deficits   ASSESSMENT:  1.  Thrombosed hemorrhoid.  Improving 2.  Lymphocytic colitis.  Tapering budesonide.  Currently on 3 mg daily 3.  GERD.  On PPI.  Asymptomatic. 4.  History of hyperplastic cecal polyp   PLAN:  1.  Ongoing supportive care with sitz baths and topical hydrocortisone as needed 2.  Continue to taper budesonide as directed 3.  Reflux precautions 4.  Continue PPI 5.  Routine office follow-up 1 year 6.  Surveillance colonoscopy around April 2023

## 2021-05-21 NOTE — Patient Instructions (Signed)
If you are age 68 or older, your body mass index should be between 23-30. Your Body mass index is 27.32 kg/m. If this is out of the aforementioned range listed, please consider follow up with your Primary Care Provider.  If you are age 61 or younger, your body mass index should be between 19-25. Your Body mass index is 27.32 kg/m. If this is out of the aformentioned range listed, please consider follow up with your Primary Care Provider.   __________________________________________________________  The [facility] providers would like to encourage you to use [facility] to communicate with providers for non-urgent requests or questions.  Due to long hold times on the telephone, sending your provider a message by [facility] may be a faster and more efficient way to get a response.  Please allow 48 business hours for a response.  Please remember that this is for non-urgent requests.   Please follow up as needed

## 2021-06-03 ENCOUNTER — Other Ambulatory Visit: Payer: Self-pay | Admitting: Family Medicine

## 2021-06-03 DIAGNOSIS — I10 Essential (primary) hypertension: Secondary | ICD-10-CM

## 2021-06-23 ENCOUNTER — Telehealth: Payer: Self-pay | Admitting: Pharmacist

## 2021-06-23 NOTE — Chronic Care Management (AMB) (Signed)
    Chronic Care Management Pharmacy Assistant   Name: Adam Villanueva  MRN: [id number] DOB: 06/09/1951  06/24/21 APPOINTMENT REMINDER   [name] was reminded to have all medications, supplements and any blood glucose and blood pressure readings available for review with Jeni Salles, Pharm. D, at his telephone visit on 06/24/21 at 11am..   Questions: Have you had any recent office visit or specialist visit outside of [place]? Has an appointment with urologist this week at [facility].   Are there any concerns you would like to discuss during your office visit? No concerns at this time.  Are you having any problems obtaining your medications? No issues with pharmacy currently.  If patient has any PAP medications ask if they are having any problems getting their PAP medication or refill? No patient assistance at this time.   Notes: Sat on hold with [facility]. Unable to verify fill dates. Patient states his lisinopril just came in mail recently. He has quite a bit of atorvastatin left he does not take everyday.  Care Gaps:  AWV - scheduled for 06/30/21 Zoster vaccines - never done COVID-19 vaccine booster 3 - overdue since 01/09/20 Flu vaccine - due  Star Rating Drug:  Atorvastatin 20mg  - last filled on 01/13/21 [facility]  Lisinopril 40mg  - last filled on 07/05/20 [facility] at [facility]   Any gaps in medications fill history? Yes.  [place]  Clinical Pharmacist Assistant [phone]

## 2021-06-24 ENCOUNTER — Ambulatory Visit (INDEPENDENT_AMBULATORY_CARE_PROVIDER_SITE_OTHER): Payer: Medicare Other | Admitting: Pharmacist

## 2021-06-24 DIAGNOSIS — E785 Hyperlipidemia, unspecified: Secondary | ICD-10-CM

## 2021-06-24 DIAGNOSIS — I10 Essential (primary) hypertension: Secondary | ICD-10-CM

## 2021-06-24 NOTE — Progress Notes (Signed)
Chronic Care Management Pharmacy Note  06/30/2021 Name:  Adam Villanueva MRN:  [id number] DOB:  04/30/1951  Summary: LDL is not at goal < 100 BP is at goal < 130/80 Pt is not taking atorvastatin or tamsulosin daily   Recommendations/Changes made from today's visit: -Recommended taking tamsulosin immediately after a meal to prevent BP dropping -Requested update rx for atorvastatin for taking every other day   Plan: BP assessment in 1-2 months   Subjective: Adam Villanueva is an 70 y.o. year old male who is a primary patient of Burchette, Alinda Sierras, MD.  The CCM team was consulted for assistance with disease management and care coordination needs.    Engaged with patient by telephone for follow up visit in response to provider referral for pharmacy case management and/or care coordination services.   Consent to Services:  The patient was given information about Chronic Care Management services, agreed to services, and gave verbal consent prior to initiation of services.  Please see initial visit note for detailed documentation.   Patient Care Team: Eulas Post, MD as PCP - General (Family Medicine) Viona Gilmore, [facility] as Pharmacist (Pharmacist)  Recent office visits: 05/12/21 Grier Mitts, MD: Patient presented for hemorrhoids. Prescribed Nystatin and hydrocortisone 25 mg.  04/28/21 Carolann Littler, MD: Patient presented for annual exam. PSA was elevated - recommended follow up with urology.  Recent consult visits: 05/21/21 Scarlette Shorts, MD (gastro): Patient presented for hemorrhoids follow up. Continue to taper budesonide.  04/08/21 Scarlette Shorts, MD (gastro): Patient presented for lymphocytic colitis follow up. Decrease budesonide to 6 mg daily x 1 month and then 3 mg daily x 1 month and then stop.  11/28/20 Scarlette Shorts, MD (gastro): Patient presented for lymphocytic colitis follow up. Continue budesonide 3 mg daily for 2-3 weeks then stop.  Hospital visits: None in previous 6  months  Objective:  Lab Results  Component Value Date   CREATININE 1.04 04/28/2021   BUN 11 04/28/2021   GFR 74.00 04/28/2021   GFRNONAA 87 (L) 01/29/2014   GFRAA >90 01/29/2014   NA 138 04/28/2021   K 4.2 04/28/2021   CALCIUM 8.9 04/28/2021   CO2 27 04/28/2021   GLUCOSE 92 04/28/2021    Lab Results  Component Value Date/Time   HGBA1C 5.6 01/22/2016 10:38 AM   GFR 74.00 04/28/2021 09:40 AM   GFR 78.36 04/04/2019 10:22 AM    Last diabetic Eye exam: No results found for: HMDIABEYEEXA  Last diabetic Foot exam: No results found for: HMDIABFOOTEX   Lab Results  Component Value Date   CHOL 178 03/04/2021   HDL 36.00 (L) 03/04/2021   LDLCALC 114 (H) 03/04/2021   LDLDIRECT 78.0 04/04/2019   TRIG 143.0 03/04/2021   CHOLHDL 5 03/04/2021    Hepatic Function Latest Ref Rng & Units 03/04/2021 04/26/2020 04/04/2019  Total Protein 6.0 - 8.3 g/dL 6.5 6.8 6.9  Albumin 3.5 - 5.2 g/dL 4.2 - 4.4  AST 0 - 37 U/L _0 ALT 0 - 53 U/L _1 Alk Phosphatase 39 - 117 U/L 69 - 75  Total Bilirubin 0.2 - 1.2 mg/dL 0.6 0.6 0.6  Bilirubin, Direct 0.0 - 0.3 mg/dL 0.1 0.1 0.1    Lab Results  Component Value Date/Time   TSH 1.36 04/28/2021 09:40 AM   TSH 0.87 04/26/2020 09:34 AM    CBC Latest Ref Rng & Units 04/28/2021 04/11/2020 04/04/2019  WBC 4.0 - 10.5 K/uL 4.7 4.8 4.4  Hemoglobin 13.0 -  17.0 g/dL 16.7 17.0 16.4  Hematocrit 39.0 - 52.0 % 47.5 48.0 47.8  Platelets 150.0 - 400.0 K/uL 212.0 226.0 215.0    No results found for: VD25OH  Clinical ASCVD: No  The 10-year ASCVD risk score (Arnett DK, et al., 2019) is: 16.1%   Values used to calculate the score:     Age: 70 years     Sex: Male     Is Non-Hispanic African American: No     Diabetic: No     Tobacco smoker: No     Systolic Blood Pressure: 762 mmHg     Is BP treated: Yes     HDL Cholesterol: 36 mg/dL     Total Cholesterol: 178 mg/dL    Depression screen [facility] 2/9 06/27/2020 04/26/2020 04/04/2019  Decreased Interest 0 0 0   Down, Depressed, Hopeless 0 0 1  PHQ - 2 Score 0 0 1  Altered sleeping 0 - 0  Tired, decreased energy 0 - 0  Change in appetite 0 - 0  Feeling bad or failure about yourself  0 - 0  Trouble concentrating 0 - 0  Moving slowly or fidgety/restless 0 - 0  Suicidal thoughts 0 - 0  PHQ-9 Score 0 - 1  Difficult doing work/chores Not difficult at all - Not difficult at all      Social History   Tobacco Use  Smoking Status Never  Smokeless Tobacco Never   BP Readings from Last 3 Encounters:  05/21/21 112/70  05/12/21 112/68  04/28/21 130/72   Pulse Readings from Last 3 Encounters:  05/21/21 88  05/12/21 83  04/28/21 68   Wt Readings from Last 3 Encounters:  05/21/21 190 lb 6 oz (86.4 kg)  05/12/21 189 lb 6.4 oz (85.9 kg)  04/28/21 186 lb 12.8 oz (84.7 kg)   BMI Readings from Last 3 Encounters:  05/21/21 27.32 kg/m  05/12/21 27.18 kg/m  04/28/21 26.80 kg/m    Assessment/Interventions: Review of patient past medical history, allergies, medications, health status, including review of consultants reports, laboratory and other test data, was performed as part of comprehensive evaluation and provision of chronic care management services.   SDOH:  (Social Determinants of Health) assessments and interventions performed: Yes   SDOH Screenings   Alcohol Screen: Not on file  Depression (PHQ2-9): Not on file  Financial Resource Strain: Low Risk    Difficulty of Paying Living Expenses: Not hard at all  Food Insecurity: Not on file  Housing: Not on file  Physical Activity: Not on file  Social Connections: Not on file  Stress: Not on file  Tobacco Use: Low Risk    Smoking Tobacco Use: Never   Smokeless Tobacco Use: Never  Transportation Needs: Not on file   [place]  No Known Allergies  Medications Reviewed Today     Reviewed by Irene Shipper, MD (Physician) on 05/21/21 at 1600  Med List Status: <None>   Medication Order Taking? Sig Documenting Provider Last  Dose Status Informant  atorvastatin (LIPITOR) 20 MG tablet [id number] Yes Take 1 tablet (20 mg total) by mouth daily. Burchette, Alinda Sierras, MD Taking Active   budesonide (ENTOCORT EC) 3 MG 24 hr capsule [id number] Yes Take 3 capsules (9 mg total) by mouth daily for 90 days, THEN 2 capsules (6 mg total) daily for 30 days, THEN 1 capsule (3 mg total) daily. Cirigliano, Vito V, DO Taking Active   cholecalciferol (VITAMIN D) 1000 units tablet [id number] Yes Take 1,000 Units by mouth  daily. [provider] Taking Active   esomeprazole (NEXIUM) 20 MG capsule [id number] Yes 20 mg. Uses only PRN for flare ups. [provider] Taking Active   ibuprofen (ADVIL,MOTRIN) 200 MG tablet [id number] Yes Take 400 mg by mouth every 6 (six) hours as needed (pain). [provider] Taking Active Self  lisinopril (ZESTRIL) 40 MG tablet [id number] Yes TAKE 1 TABLET BY MOUTH  DAILY Burchette, Alinda Sierras, MD Taking Active   metoprolol succinate (TOPROL-XL) 50 MG 24 hr tablet [id number] Yes TAKE 1 TABLET BY MOUTH  DAILY WITH OR IMMEDIATELY  FOLLOWING A MEAL Burchette, Alinda Sierras, MD Taking Active   Multiple Vitamin (MULTIVITAMIN) tablet [id number] Yes Take 1 tablet by mouth daily. [provider] Taking Active   nystatin cream (MYCOSTATIN) [id number] Yes Apply 1 application twice a day to rash. Billie Ruddy, MD Taking Active   Omega-3 Fatty Acids (OMEGA-3 FISH OIL PO) [id number] Yes Take 2,400 mg by mouth in the morning and at bedtime. [provider] Taking Active   pantoprazole (PROTONIX) 40 MG tablet [id number] Yes TAKE 1 TABLET BY MOUTH  DAILY Burchette, Alinda Sierras, MD Taking Active   sildenafil (REVATIO) 20 MG tablet [id number] Yes Take 2 to 5 tablets one hour prior to sexual activity. Eulas Post, MD Taking Active   Turmeric 400 MG CAPS [id number] Yes Take by mouth as needed. [provider] Taking Active   vitamin C (ASCORBIC ACID) 500 MG tablet [id number] Yes Take 500 mg by mouth  daily. [provider] Taking Active             Patient Active Problem List   Diagnosis Date Noted   Osteoarthritis, multiple sites 05/26/2013   Chronic insomnia 01/26/2012   Hyperlipidemia 10/03/2010   Essential hypertension 10/03/2010   ESOPHAGITIS, REFLUX 10/03/2010   GERD 10/03/2010   NAUSEA 10/03/2010   DYSPHAGIA UNSPECIFIED 10/03/2010   DYSPHAGIA 10/03/2010   ABDOMINAL PAIN, LEFT UPPER QUADRANT 10/03/2010   ABDOMINAL PAIN-MULTIPLE SITES 10/03/2010    Immunization History  Administered Date(s) Administered   Fluad Quad(high Dose 65+) 06/22/2019   Influenza Split 08/01/2012   Influenza,inj,Quad PF,6+ Mos 05/26/2013, 06/19/2014, 07/04/2015, 07/15/2016, 07/05/2017, 07/29/2018   PFIZER(Purple Top)SARS-COV-2 Vaccination 11/21/2019, 12/12/2019   Pneumococcal Conjugate-13 04/04/2019   Pneumococcal Polysaccharide-23 04/26/2020   Tdap 02/23/2013   Zoster, Live 02/28/2014   Patient reports he saw a urologist and was started on tamsulosin. At first, he wasn't taking it every day because it was dropping his blood pressure. He feels dizzy when this happens.  Patient is also not taking his atorvastatin every day as he feels this is contributing to his back pain. He can tolerate taking it every other day and does not notice the back pain as much. Patient is working on losing some weight and has made changes with his diet.   He is also newly supplementing with standard process and liver flex, alva flex, and AB beta food.  Conditions to be addressed/monitored:  Hypertension, Hyperlipidemia, GERD, Osteoarthritis, BPH and insomnia  Conditions addressed this visit: Hypertension, hyperlipidemia  Care Plan : CCM Pharmacy Care Plan  Updates made by Viona Gilmore, [place] since 06/30/2021 12:00 AM     Problem: Problem: Hypertension, Hyperlipidemia, GERD, Osteoarthritis, BPH and insomnia      Long-Range Goal: Patient-Specific Goal   Start Date: 12/25/2020  Expected End  Date: 12/25/2021  Recent Progress: On track  Priority: High  Note:   Current Barriers:  Unable to independently monitor therapeutic efficacy Unable to achieve  control of cholesterol   Pharmacist Clinical Goal(s):  Patient will achieve adherence to monitoring guidelines and medication adherence to achieve therapeutic efficacy achieve control of cholesterol as evidenced by next lipid panel  through collaboration with PharmD and provider.   Interventions: 1:1 collaboration with Eulas Post, MD regarding development and update of comprehensive plan of care as evidenced by provider attestation and co-signature Inter-disciplinary care team collaboration (see longitudinal plan of care) Comprehensive medication review performed; medication list updated in electronic medical record  Hypertension (BP goal <140/90) -Controlled -Current treatment: Lisinopril 40 mg 1 tablet daily Metoprolol succinate 50 mg 1 tablet daily -Medications previously tried: n/a  -Current home readings: 125/75, 110/68 (checking once a week) -Current dietary habits: using salt more than he used to; recommended lower sodium salt -Current exercise habits: walking 3 days a week -Reports hypotensive/hypertensive symptoms -Educated on Daily salt intake goal < 2300 mg; Exercise goal of 150 minutes per week; Importance of home blood pressure monitoring; Proper BP monitoring technique; -Counseled to monitor BP at home weekly, document, and provide log at future appointments -Counseled on diet and exercise extensively Recommended to continue current medication  Hyperlipidemia: (LDL goal < 100) -Uncontrolled -Current treatment: Fish oil daily 2400 mg 2 capsules daily Atorvastatin 20 mg 1 tablet daily - taking every other day  -Medications previously tried: rosuvastatin (back pain), simvastatin  -Current dietary patterns: eats more fiber in the winter; uses olive oil for frying and sometimes uses butter; fries food  3-4 times a week -Current exercise habits: walking 3 days a week for 2-2.5 miles -Educated on Cholesterol goals;  Benefits of statin for ASCVD risk reduction; Importance of limiting foods high in cholesterol; Exercise goal of 150 minutes per week; -Counseled on diet and exercise extensively Recommended to continue current medication Collaborated with PCP about starting another cholesterol medication and every other day dosing.  GERD (Goal: minimize symptoms) -Uncontrolled -Current treatment  Esomeprazole 20 mg 1 capsule as needed Pantoprazole 40 mg 1 capsule every other days -Medications previously tried: none -Counseled on not using esomeprazole and pantoprazole on the same days Educated on non-pharmacologic management of symptoms such as elevating the head of your bed, avoiding eating 2-3 hours before bed, avoiding triggering foods such as acidic, spicy, or fatty foods, eating smaller meals, and wearing clothes that are loose around the waist  BPH (Goal: minimize symptoms) -Controlled -Current treatment  tamsulosin 0.4 mg 1 capsule daily -Medications previously tried: none  -Counseled on taking after a meal to prevent BP dropping.  Osteoarthritis (Goal: minimize pain) -Not ideally controlled -Current treatment  Ibuprofen 200 mg as needed Aspirin every once in a while (BC powder) Tylenol 500 mg as needed -Medications previously tried: n/a  -Recommended diclofenac (voltaren gel) for joint pain  Health Maintenance -Vaccine gaps: shingrix -Current therapy:  Vitamin D and calcium 1000 units 1 tablet daily Multivitamin 1 tablet daily - not every day Turmeric 400 mg 1 capsule as needed Vitamin C 500 mg 1 tablet daily Supplements: Livaplex, albaplex, betafood, cataplex, symplex, cyruta -Educated on Cost vs benefit of each product must be carefully weighed by individual consumer -Patient is satisfied with current therapy and denies issues -Recommended to continue current  medication  Patient Goals/Self-Care Activities Patient will:  - take medications as prescribed check blood pressure weekly, document, and provide at future appointments target a minimum of 150 minutes of moderate intensity exercise weekly engage in dietary modifications by adding more fiber to his diet to lower cholesterol  Follow Up Plan:  Telephone follow up appointment with care management team member scheduled for: 6 months        Medication Assistance: None required.  Patient affirms current coverage meets needs.  Patient's preferred pharmacy is:  [facility] ([facility]) [place], [place] [address] Phone: [phone] Fax: [phone]  [facility] [place], [place] - [address] [place] [address] Phone: [phone] Fax: [phone]  [facility]  ([place]) - [place], [place] [address] [place] [address] [place] [address] Phone: [phone] Fax: [place] [phone] [place], [place] - [address] [place] [address] Phone: [phone] Fax: [phone]  [facility] ([facility]) - [place], [place] [address] Phone: [phone] Fax: [phone]  Uses pill box? Yes- weekly Pt endorses 99% compliance - once a month  We discussed: Current pharmacy is preferred with insurance plan and patient is satisfied with pharmacy services Patient decided to: Continue current medication management strategy  Care Plan and Follow Up Patient Decision:  Patient agrees to Care Plan and Follow-up.  Plan: Telephone follow up appointment with care management team member scheduled for:  6 months  Jeni Salles, PharmD [place] Pharmacist [place] at  [place] [phone]

## 2021-06-25 DIAGNOSIS — R350 Frequency of micturition: Secondary | ICD-10-CM | POA: Diagnosis not present

## 2021-06-27 DIAGNOSIS — E785 Hyperlipidemia, unspecified: Secondary | ICD-10-CM | POA: Diagnosis not present

## 2021-06-27 DIAGNOSIS — I10 Essential (primary) hypertension: Secondary | ICD-10-CM

## 2021-06-30 ENCOUNTER — Ambulatory Visit (INDEPENDENT_AMBULATORY_CARE_PROVIDER_SITE_OTHER): Payer: Medicare Other

## 2021-06-30 ENCOUNTER — Other Ambulatory Visit: Payer: Self-pay

## 2021-06-30 VITALS — BP 132/72 | HR 79 | Temp 98.4°F | Ht 70.0 in | Wt 192.0 lb

## 2021-06-30 DIAGNOSIS — Z23 Encounter for immunization: Secondary | ICD-10-CM | POA: Diagnosis not present

## 2021-06-30 DIAGNOSIS — Z0000 Encounter for general adult medical examination without abnormal findings: Secondary | ICD-10-CM

## 2021-06-30 NOTE — Patient Instructions (Signed)
Adam Villanueva , Thank you for taking time to come for your Medicare Wellness Visit. I appreciate your ongoing commitment to your health goals. Please review the following plan we discussed and let me know if I can assist you in the future.   Screening recommendations/referrals: Colonoscopy: 01/04/2017 Recommended yearly ophthalmology/optometry visit for glaucoma screening and checkup Recommended yearly dental visit for hygiene and checkup  Vaccinations: Influenza vaccine: due in fall 2022  Pneumococcal vaccine: completed series  Tdap vaccine: 02/23/2013 Shingles vaccine: will consider     Advanced directives: will provide copies   Conditions/risks identified: none   Next appointment: none   Preventive Care 64 Years and Older, Male Preventive care refers to lifestyle choices and visits with your health care provider that can promote health and wellness. What does preventive care include? A yearly physical exam. This is also called an annual well check. Dental exams once or twice a year. Routine eye exams. Ask your health care provider how often you should have your eyes checked. Personal lifestyle choices, including: Daily care of your teeth and gums. Regular physical activity. Eating a healthy diet. Avoiding tobacco and drug use. Limiting alcohol use. Practicing safe sex. Taking low doses of aspirin every day. Taking vitamin and mineral supplements as recommended by your health care provider. What happens during an annual well check? The services and screenings done by your health care provider during your annual well check will depend on your age, overall health, lifestyle risk factors, and family history of disease. Counseling  Your health care provider may ask you questions about your: Alcohol use. Tobacco use. Drug use. Emotional well-being. Home and relationship well-being. Sexual activity. Eating habits. History of falls. Memory and ability to understand  (cognition). Work and work Statistician. Screening  You may have the following tests or measurements: Height, weight, and BMI. Blood pressure. Lipid and cholesterol levels. These may be checked every 5 years, or more frequently if you are over 70 years old. Skin check. Lung cancer screening. You may have this screening every year starting at age 31 if you have a 30-pack-year history of smoking and currently smoke or have quit within the past 15 years. Fecal occult blood test (FOBT) of the stool. You may have this test every year starting at age 26. Flexible sigmoidoscopy or colonoscopy. You may have a sigmoidoscopy every 5 years or a colonoscopy every 10 years starting at age 22. Prostate cancer screening. Recommendations will vary depending on your family history and other risks. Hepatitis C blood test. Hepatitis B blood test. Sexually transmitted disease (STD) testing. Diabetes screening. This is done by checking your blood sugar (glucose) after you have not eaten for a while (fasting). You may have this done every 1-3 years. Abdominal aortic aneurysm (AAA) screening. You may need this if you are a current or former smoker. Osteoporosis. You may be screened starting at age 7 if you are at high risk. Talk with your health care provider about your test results, treatment options, and if necessary, the need for more tests. Vaccines  Your health care provider may recommend certain vaccines, such as: Influenza vaccine. This is recommended every year. Tetanus, diphtheria, and acellular pertussis (Tdap, Td) vaccine. You may need a Td booster every 10 years. Zoster vaccine. You may need this after age 35. Pneumococcal 13-valent conjugate (PCV13) vaccine. One dose is recommended after age 54. Pneumococcal polysaccharide (PPSV23) vaccine. One dose is recommended after age 5. Talk to your health care provider about which screenings and vaccines  you need and how often you need them. This  information is not intended to replace advice given to you by your health care provider. Make sure you discuss any questions you have with your health care provider. Document Released: 10/11/2015 Document Revised: 06/03/2016 Document Reviewed: 07/16/2015 Elsevier Interactive Patient Education  2017 [place] Prevention in the Home Falls can cause injuries. They can happen to people of all ages. There are many things you can do to make your home safe and to help prevent falls. What can I do on the outside of my home? Regularly fix the edges of walkways and driveways and fix any cracks. Remove anything that might make you trip as you walk through a door, such as a raised step or threshold. Trim any bushes or trees on the path to your home. Use bright outdoor lighting. Clear any walking paths of anything that might make someone trip, such as rocks or tools. Regularly check to see if handrails are loose or broken. Make sure that both sides of any steps have handrails. Any raised decks and porches should have guardrails on the edges. Have any leaves, snow, or ice cleared regularly. Use sand or salt on walking paths during winter. Clean up any spills in your garage right away. This includes oil or grease spills. What can I do in the bathroom? Use night lights. Install grab bars by the toilet and in the tub and shower. Do not use towel bars as grab bars. Use non-skid mats or decals in the tub or shower. If you need to sit down in the shower, use a plastic, non-slip stool. Keep the floor dry. Clean up any water that spills on the floor as soon as it happens. Remove soap buildup in the tub or shower regularly. Attach bath mats securely with double-sided non-slip rug tape. Do not have throw rugs and other things on the floor that can make you trip. What can I do in the bedroom? Use night lights. Make sure that you have a light by your bed that is easy to reach. Do not use any sheets or  blankets that are too big for your bed. They should not hang down onto the floor. Have a firm chair that has side arms. You can use this for support while you get dressed. Do not have throw rugs and other things on the floor that can make you trip. What can I do in the kitchen? Clean up any spills right away. Avoid walking on wet floors. Keep items that you use a lot in easy-to-reach places. If you need to reach something above you, use a strong step stool that has a grab bar. Keep electrical cords out of the way. Do not use floor polish or wax that makes floors slippery. If you must use wax, use non-skid floor wax. Do not have throw rugs and other things on the floor that can make you trip. What can I do with my stairs? Do not leave any items on the stairs. Make sure that there are handrails on both sides of the stairs and use them. Fix handrails that are broken or loose. Make sure that handrails are as long as the stairways. Check any carpeting to make sure that it is firmly attached to the stairs. Fix any carpet that is loose or worn. Avoid having throw rugs at the top or bottom of the stairs. If you do have throw rugs, attach them to the floor with carpet tape. Make sure  that you have a light switch at the top of the stairs and the bottom of the stairs. If you do not have them, ask someone to add them for you. What else can I do to help prevent falls? Wear shoes that: Do not have high heels. Have rubber bottoms. Are comfortable and fit you well. Are closed at the toe. Do not wear sandals. If you use a stepladder: Make sure that it is fully opened. Do not climb a closed stepladder. Make sure that both sides of the stepladder are locked into place. Ask someone to hold it for you, if possible. Clearly mark and make sure that you can see: Any grab bars or handrails. First and last steps. Where the edge of each step is. Use tools that help you move around (mobility aids) if they are  needed. These include: Canes. Walkers. Scooters. Crutches. Turn on the lights when you go into a dark area. Replace any light bulbs as soon as they burn out. Set up your furniture so you have a clear path. Avoid moving your furniture around. If any of your floors are uneven, fix them. If there are any pets around you, be aware of where they are. Review your medicines with your doctor. Some medicines can make you feel dizzy. This can increase your chance of falling. Ask your doctor what other things that you can do to help prevent falls. This information is not intended to replace advice given to you by your health care provider. Make sure you discuss any questions you have with your health care provider. Document Released: 07/11/2009 Document Revised: 02/20/2016 Document Reviewed: 10/19/2014 Elsevier Interactive Patient Education  2017 [employer].

## 2021-06-30 NOTE — Progress Notes (Signed)
Subjective:   Adam Villanueva is a 70 y.o. male who presents for an Subsequent  Medicare Annual Wellness Visit.  Review of Systems    N/a       Objective:    There were no vitals filed for this visit. There is no height or weight on file to calculate BMI.  Advanced Directives 06/27/2020 01/04/2017 12/23/2016  Does Patient Have a Medical Advance Directive? Yes Yes Yes  Type of Paramedic of [place];Living will [place];Living will [place];Living will  Does patient want to make changes to medical advance directive? No - Patient declined - -  Copy of [place] in Chart? No - copy requested No - copy requested -    Current Medications (verified) Outpatient Encounter Medications as of 06/30/2021  Medication Sig   atorvastatin (LIPITOR) 20 MG tablet Take 1 tablet (20 mg total) by mouth daily.   budesonide (ENTOCORT EC) 3 MG 24 hr capsule Take 3 capsules (9 mg total) by mouth daily for 90 days, THEN 2 capsules (6 mg total) daily for 30 days, THEN 1 capsule (3 mg total) daily.   cholecalciferol (VITAMIN D) 1000 units tablet Take 1,000 Units by mouth daily.   esomeprazole (NEXIUM) 20 MG capsule 20 mg. Uses only PRN for flare ups.   ibuprofen (ADVIL,MOTRIN) 200 MG tablet Take 400 mg by mouth every 6 (six) hours as needed (pain).   lisinopril (ZESTRIL) 40 MG tablet TAKE 1 TABLET BY MOUTH  DAILY   metoprolol succinate (TOPROL-XL) 50 MG 24 hr tablet TAKE 1 TABLET BY MOUTH  DAILY WITH OR IMMEDIATELY  FOLLOWING A MEAL   Multiple Vitamin (MULTIVITAMIN) tablet Take 1 tablet by mouth daily.   nystatin cream (MYCOSTATIN) Apply 1 application twice a day to rash.   Omega-3 Fatty Acids (OMEGA-3 FISH OIL PO) Take 2,400 mg by mouth in the morning and at bedtime.   pantoprazole (PROTONIX) 40 MG tablet TAKE 1 TABLET BY MOUTH  DAILY   sildenafil (REVATIO) 20 MG tablet Take 2 to 5 tablets one hour prior to sexual activity.    tamsulosin (FLOMAX) 0.4 MG CAPS capsule Take 0.4 mg by mouth.   Turmeric 400 MG CAPS Take by mouth as needed.   vitamin C (ASCORBIC ACID) 500 MG tablet Take 500 mg by mouth daily.   No facility-administered encounter medications on file as of 06/30/2021.    Allergies (verified) Patient has no known allergies.   History: Past Medical History:  Diagnosis Date   Allergy    Anxiety    Arthritis    Cataract    Chronic insomnia    GERD (gastroesophageal reflux disease)    Hyperlipidemia    Hypertension    Lower back pain    and mid back pain   Prostatitis    Sleep apnea    no cpap   Past Surgical History:  Procedure Laterality Date   COLONOSCOPY     POLYPECTOMY     SEPTOPLASTY     broken nose   WISDOM TOOTH EXTRACTION     Family History  Problem Relation Age of Onset   Arthritis Mother        rheumatoid   Hyperlipidemia Mother    Hypertension Mother    Rectal cancer Mother        27 years   Arthritis Father    Hypertension Father    Alzheimer's disease Father    Throat cancer Brother  Arthritis Maternal Grandmother    Arthritis Maternal Grandfather    Arthritis Paternal Grandmother    Arthritis Paternal Grandfather    Colon polyps Neg Hx    Esophageal cancer Neg Hx    Stomach cancer Neg Hx    Social History   Socioeconomic History   Marital status: Divorced    Spouse name: Not on file   Number of children: Not on file   Years of education: Not on file   Highest education level: Not on file  Occupational History   Not on file  Tobacco Use   Smoking status: Never   Smokeless tobacco: Never  Vaping Use   Vaping Use: Never used  Substance and Sexual Activity   Alcohol use: Yes    Alcohol/week: 0.0 standard drinks    Comment: occ beer   Drug use: No   Sexual activity: Yes    Partners: Female  Other Topics Concern   Not on file  Social History Narrative   Not on file   Social Determinants of Health   Financial Resource Strain: Low Risk     Difficulty of Paying Living Expenses: Not hard at all  Food Insecurity: Not on file  Transportation Needs: Not on file  Physical Activity: Not on file  Stress: Not on file  Social Connections: Not on file    Tobacco Counseling Counseling given: Not Answered   Clinical Intake:                 Diabetic?no          Activities of Daily Living No flowsheet data found.  Patient Care Team: Eulas Post, MD as PCP - General (Family Medicine) Viona Gilmore, [facility] as Pharmacist (Pharmacist)  Indicate any recent Medical Services you may have received from other than [facility] providers in the past year (date may be approximate).     Assessment:   This is a routine wellness examination for Adam Villanueva.  Hearing/Vision screen No results found.  Dietary issues and exercise activities discussed:     Goals Addressed   None    Depression Screen PHQ 2/9 Scores 06/27/2020 04/26/2020 04/04/2019 03/28/2018 07/05/2017 05/24/2015  PHQ - 2 Score 0 0 1 0 0 0  PHQ- 9 Score 0 - 1 - - -    Fall Risk Fall Risk  06/27/2020 04/26/2020 04/04/2019 03/28/2018  Falls in the past year? 0 0 0 No  Number falls in past yr: 0 0 - -  Injury with Fall? 0 0 - -  Follow up Falls evaluation completed;Falls prevention discussed Falls evaluation completed - -    FALL RISK PREVENTION PERTAINING TO THE HOME:  Any stairs in or around the home? Yes  If so, are there any without handrails? No  Home free of loose throw rugs in walkways, pet beds, electrical cords, etc? Yes  Adequate lighting in your home to reduce risk of falls? Yes   ASSISTIVE DEVICES UTILIZED TO PREVENT FALLS:  Life alert? No  Use of a cane, walker or w/c? No  Grab bars in the bathroom? Yes  Shower chair or bench in shower? Yes  Elevated toilet seat or a handicapped toilet? Yes   TIMED UP AND GO:  Was the test performed? Yes .  Length of time to ambulate 10 feet: 7 sec.   Gait steady and fast without use of assistive  device  Cognitive Function:    Normal cognitive status assessed by direct observation by this Nurse Health Advisor. No abnormalities  found.      Immunizations Immunization History  Administered Date(s) Administered   Fluad Quad(high Dose 65+) 06/22/2019   Influenza Split 08/01/2012   Influenza,inj,Quad PF,6+ Mos 05/26/2013, 06/19/2014, 07/04/2015, 07/15/2016, 07/05/2017, 07/29/2018   PFIZER(Purple Top)SARS-COV-2 Vaccination 11/21/2019, 12/12/2019   Pneumococcal Conjugate-13 04/04/2019   Pneumococcal Polysaccharide-23 04/26/2020   Tdap 02/23/2013   Zoster, Live 02/28/2014    TDAP status: Up to date  Flu Vaccine status: Up to date  Pneumococcal vaccine status: Up to date  Covid-19 vaccine status: Completed vaccines  Qualifies for Shingles Vaccine? Yes   Zostavax completed No   Shingrix Completed?: No.    Education has been provided regarding the importance of this vaccine. Patient has been advised to call insurance company to determine out of pocket expense if they have not yet received this vaccine. Advised may also receive vaccine at local pharmacy or Health Dept. Verbalized acceptance and understanding.  Screening Tests Health Maintenance  Topic Date Due   Zoster Vaccines- Shingrix (1 of 2) Never done   COVID-19 Vaccine (3 - Pfizer risk series) 01/09/2020   INFLUENZA VACCINE  04/28/2021   COLONOSCOPY (Pts 45-69yrs Insurance coverage will need to be confirmed)  01/04/2022   TETANUS/TDAP  02/24/2023   Hepatitis C Screening  Completed   HPV VACCINES  Aged Out    Health Maintenance  Health Maintenance Due  Topic Date Due   Zoster Vaccines- Shingrix (1 of 2) Never done   COVID-19 Vaccine (3 - Pfizer risk series) 01/09/2020   INFLUENZA VACCINE  04/28/2021    Colorectal cancer screening: Type of screening: Colonoscopy. Completed 01/04/2017. Repeat every 5 years  Lung Cancer Screening: (Low Dose CT Chest recommended if Age 18-80 years, 30 pack-year currently smoking  OR have quit w/in 15years.) does not qualify.   Lung Cancer Screening Referral: n/a  Additional Screening:  Hepatitis C Screening: does not qualify; Completed 01/22/2016  Vision Screening: Recommended annual ophthalmology exams for early detection of glaucoma and other disorders of the eye. Is the patient up to date with their annual eye exam?  Yes  Who is the provider or what is the name of the office in which the patient attends annual eye exams? Dr.Lyles  If pt is not established with a provider, would they like to be referred to a provider to establish care? No .   Dental Screening: Recommended annual dental exams for proper oral hygiene  Community Resource Referral / Chronic Care Management: CRR required this visit?  No   CCM required this visit?  No      Plan:     I have personally reviewed and noted the following in the patient's chart:   Medical and social history Use of alcohol, tobacco or illicit drugs  Current medications and supplements including opioid prescriptions. Patient is not currently taking opioid prescriptions. Functional ability and status Nutritional status Physical activity Advanced directives List of other physicians Hospitalizations, surgeries, and ER visits in previous 12 months Vitals Screenings to include cognitive, depression, and falls Referrals and appointments  In addition, I have reviewed and discussed with patient certain preventive protocols, quality metrics, and best practice recommendations. A written personalized care plan for preventive services as well as general preventive health recommendations were provided to patient.     Randel Pigg, LPN   [id number]/12/[id number]   Nurse Notes: none

## 2021-06-30 NOTE — Patient Instructions (Signed)
Hi Rivaan,   It was great to get to catch up with you again! Below is a summary of some of the topics we discussed.   Please reach out to me if you have any questions or need anything before our follow up!  Best, Maddie  Jeni Salles, PharmD, [place] [facility] [place]   Visit Information   Goals Addressed   None    Patient Care Plan: CCM Pharmacy Care Plan     Problem Identified: Problem: Hypertension, Hyperlipidemia, GERD, Osteoarthritis, BPH and insomnia      Long-Range Goal: Patient-Specific Goal   Start Date: 12/25/2020  Expected End Date: 12/25/2021  Recent Progress: On track  Priority: High  Note:   Current Barriers:  Unable to independently monitor therapeutic efficacy Unable to achieve control of cholesterol   Pharmacist Clinical Goal(s):  Patient will achieve adherence to monitoring guidelines and medication adherence to achieve therapeutic efficacy achieve control of cholesterol as evidenced by next lipid panel  through collaboration with PharmD and provider.   Interventions: 1:1 collaboration with Eulas Post, MD regarding development and update of comprehensive plan of care as evidenced by provider attestation and co-signature Inter-disciplinary care team collaboration (see longitudinal plan of care) Comprehensive medication review performed; medication list updated in electronic medical record  Hypertension (BP goal <140/90) -Controlled -Current treatment: Lisinopril 40 mg 1 tablet daily Metoprolol succinate 50 mg 1 tablet daily -Medications previously tried: n/a  -Current home readings: 125/75, 110/68 (checking once a week) -Current dietary habits: using salt more than he used to; recommended lower sodium salt -Current exercise habits: walking 3 days a week -Reports hypotensive/hypertensive symptoms -Educated on Daily salt intake goal < 2300 mg; Exercise goal of 150 minutes per  week; Importance of home blood pressure monitoring; Proper BP monitoring technique; -Counseled to monitor BP at home weekly, document, and provide log at future appointments -Counseled on diet and exercise extensively Recommended to continue current medication  Hyperlipidemia: (LDL goal < 100) -Uncontrolled -Current treatment: Fish oil daily 2400 mg 2 capsules daily Atorvastatin 20 mg 1 tablet daily - taking every other day  -Medications previously tried: rosuvastatin (back pain), simvastatin  -Current dietary patterns: eats more fiber in the winter; uses olive oil for frying and sometimes uses butter; fries food 3-4 times a week -Current exercise habits: walking 3 days a week for 2-2.5 miles -Educated on Cholesterol goals;  Benefits of statin for ASCVD risk reduction; Importance of limiting foods high in cholesterol; Exercise goal of 150 minutes per week; -Counseled on diet and exercise extensively Recommended to continue current medication Collaborated with PCP about starting another cholesterol medication and every other day dosing.  GERD (Goal: minimize symptoms) -Uncontrolled -Current treatment  Esomeprazole 20 mg 1 capsule as needed Pantoprazole 40 mg 1 capsule every other days -Medications previously tried: none -Counseled on not using esomeprazole and pantoprazole on the same days Educated on non-pharmacologic management of symptoms such as elevating the head of your bed, avoiding eating 2-3 hours before bed, avoiding triggering foods such as acidic, spicy, or fatty foods, eating smaller meals, and wearing clothes that are loose around the waist  BPH (Goal: minimize symptoms) -Controlled -Current treatment  tamsulosin 0.4 mg 1 capsule daily -Medications previously tried: none  -Counseled on taking after a meal to prevent BP dropping.  Osteoarthritis (Goal: minimize pain) -Not ideally controlled -Current treatment  Ibuprofen 200 mg as needed Aspirin every once in a  while (BC powder) Tylenol 500 mg as needed -Medications  previously tried: n/a  -Recommended diclofenac (voltaren gel) for joint pain  Health Maintenance -Vaccine gaps: shingrix -Current therapy:  Vitamin D and calcium 1000 units 1 tablet daily Multivitamin 1 tablet daily - not every day Turmeric 400 mg 1 capsule as needed Vitamin C 500 mg 1 tablet daily Supplements: Livaplex, albaplex, betafood, cataplex, symplex, cyruta -Educated on Cost vs benefit of each product must be carefully weighed by individual consumer -Patient is satisfied with current therapy and denies issues -Recommended to continue current medication  Patient Goals/Self-Care Activities Patient will:  - take medications as prescribed check blood pressure weekly, document, and provide at future appointments target a minimum of 150 minutes of moderate intensity exercise weekly engage in dietary modifications by adding more fiber to his diet to lower cholesterol  Follow Up Plan: Telephone follow up appointment with care management team member scheduled for: 6 months       Patient verbalizes understanding of instructions provided today and agrees to view in [place].  Telephone follow up appointment with pharmacy team member scheduled for:6 months  Viona Gilmore, [facility]

## 2021-07-01 NOTE — Addendum Note (Signed)
Addended by: Eulas Post on: 07/01/2021 08:53 AM   Modules accepted: Orders

## 2021-07-07 NOTE — Progress Notes (Signed)
Subjective:   Adam Villanueva is a 70 y.o. male who presents for an Subsequent  Medicare Annual Wellness Visit.  Review of Systems     Cardiac Risk Factors include: hypertension;advanced age (>18men, >83 women);male gender     Objective:    Today's Vitals   06/30/21 0954  BP: 132/72  Pulse: 79  Temp: 98.4 F (36.9 C)  SpO2: 96%  Weight: 192 lb (87.1 kg)  Height: 5\' 10"  (1.778 m)   Body mass index is 27.55 kg/m.  Advanced Directives 06/30/2021 06/27/2020 01/04/2017 12/23/2016  Does Patient Have a Medical Advance Directive? Yes Yes Yes Yes  Type of Paramedic of [place];Living will [place];Living will [place];Living will [place];Living will  Does patient want to make changes to medical advance directive? - No - Patient declined - -  Copy of [place] in Chart? No - copy requested No - copy requested No - copy requested -    Current Medications (verified) Outpatient Encounter Medications as of 06/30/2021  Medication Sig   atorvastatin (LIPITOR) 20 MG tablet Take 1 tablet (20 mg total) by mouth daily.   budesonide (ENTOCORT EC) 3 MG 24 hr capsule Take 3 capsules (9 mg total) by mouth daily for 90 days, THEN 2 capsules (6 mg total) daily for 30 days, THEN 1 capsule (3 mg total) daily.   cholecalciferol (VITAMIN D) 1000 units tablet Take 1,000 Units by mouth daily.   esomeprazole (NEXIUM) 20 MG capsule 20 mg. Uses only PRN for flare ups.   ibuprofen (ADVIL,MOTRIN) 200 MG tablet Take 400 mg by mouth every 6 (six) hours as needed (pain).   lisinopril (ZESTRIL) 40 MG tablet TAKE 1 TABLET BY MOUTH  DAILY   metoprolol succinate (TOPROL-XL) 50 MG 24 hr tablet TAKE 1 TABLET BY MOUTH  DAILY WITH OR IMMEDIATELY  FOLLOWING A MEAL   Multiple Vitamin (MULTIVITAMIN) tablet Take 1 tablet by mouth daily.   Omega-3 Fatty Acids (OMEGA-3 FISH OIL PO) Take 2,400 mg by mouth in the morning and at  bedtime.   pantoprazole (PROTONIX) 40 MG tablet TAKE 1 TABLET BY MOUTH  DAILY   sildenafil (REVATIO) 20 MG tablet Take 2 to 5 tablets one hour prior to sexual activity.   tamsulosin (FLOMAX) 0.4 MG CAPS capsule Take 0.4 mg by mouth.   Turmeric 400 MG CAPS Take by mouth as needed.   vitamin C (ASCORBIC ACID) 500 MG tablet Take 500 mg by mouth daily.   nystatin cream (MYCOSTATIN) Apply 1 application twice a day to rash. (Patient not taking: Reported on 06/30/2021)   No facility-administered encounter medications on file as of 06/30/2021.    Allergies (verified) Patient has no known allergies.   History: Past Medical History:  Diagnosis Date   Allergy    Anxiety    Arthritis    Cataract    Chronic insomnia    GERD (gastroesophageal reflux disease)    Hyperlipidemia    Hypertension    Lower back pain    and mid back pain   Prostatitis    Sleep apnea    no cpap   Past Surgical History:  Procedure Laterality Date   COLONOSCOPY     POLYPECTOMY     SEPTOPLASTY     broken nose   WISDOM TOOTH EXTRACTION     Family History  Problem Relation Age of Onset   Arthritis Mother        rheumatoid  Hyperlipidemia Mother    Hypertension Mother    Rectal cancer Mother        21 years   Arthritis Father    Hypertension Father    Alzheimer's disease Father    Throat cancer Brother    Arthritis Maternal Grandmother    Arthritis Maternal Grandfather    Arthritis Paternal Grandmother    Arthritis Paternal Grandfather    Colon polyps Neg Hx    Esophageal cancer Neg Hx    Stomach cancer Neg Hx    Social History   Socioeconomic History   Marital status: Divorced    Spouse name: Not on file   Number of children: Not on file   Years of education: Not on file   Highest education level: Not on file  Occupational History   Not on file  Tobacco Use   Smoking status: Never   Smokeless tobacco: Never  Vaping Use   Vaping Use: Never used  Substance and Sexual Activity   Alcohol  use: Yes    Alcohol/week: 0.0 standard drinks    Comment: occ beer   Drug use: No   Sexual activity: Yes    Partners: Female  Other Topics Concern   Not on file  Social History Narrative   Not on file   Social Determinants of Health   Financial Resource Strain: Low Risk    Difficulty of Paying Living Expenses: Not hard at all  Food Insecurity: No Food Insecurity   Worried About Charity fundraiser in the Last Year: Never true   [place] in the Last Year: Never true  Transportation Needs: No Transportation Needs   Lack of Transportation (Medical): No   Lack of Transportation (Non-Medical): No  Physical Activity: Sufficiently Active   Days of Exercise per Week: 4 days   Minutes of Exercise per Session: 60 min  Stress: No Stress Concern Present   Feeling of Stress : Not at all  Social Connections: Moderately Integrated   Frequency of Communication with Friends and Family: Twice a week   Frequency of Social Gatherings with Friends and Family: Twice a week   Attends Religious Services: More than 4 times per year   Active Member of [employer] or Organizations: No   Attends Music therapist: Never   Marital Status: Married    Tobacco Counseling Counseling given: Not Answered   Clinical Intake:  Pre-visit preparation completed: Yes  Pain : No/denies pain     Nutritional Risks: None Diabetes: No  How often do you need to have someone help you when you read instructions, pamphlets, or other written materials from your doctor or pharmacy?: 1 - Never What is the last grade level you completed in school?: college  Diabetic?no   Interpreter Needed?: No  Information entered by :: [place] of Daily Living In your present state of health, do you have any difficulty performing the following activities: 07/02/2021 06/30/2021  Hearing? N N  Vision? N N  Difficulty concentrating or making decisions? N N  Walking or climbing stairs? N N   Dressing or bathing? N N  Doing errands, shopping? N N  Preparing Food and eating ? N N  Using the Toilet? N N  In the past six months, have you accidently leaked urine? N N  Do you have problems with loss of bowel control? N N  Managing your Medications? N N  Managing your Finances? N N  Housekeeping or managing your Housekeeping? N  N  Some recent data might be hidden    Patient Care Team: Eulas Post, MD as PCP - General (Family Medicine) Viona Gilmore, [facility] as Pharmacist (Pharmacist)  Indicate any recent Medical Services you may have received from other than [facility] providers in the past year (date may be approximate).     Assessment:   This is a routine wellness examination for [place].  Hearing/Vision screen Vision Screening - Comments:: Annual eye exams wear glasses   Dietary issues and exercise activities discussed: Current Exercise Habits: Home exercise routine, Type of exercise: walking, Time (Minutes): 30, Frequency (Times/Week): 3, Weekly Exercise (Minutes/Week): 90, Intensity: Mild, Exercise limited by: None identified   Goals Addressed             This Visit's Progress    Patient Stated   On track    I will continue to walk 3x per week for at least 30-40 minutes       Depression Screen PHQ 2/9 Scores 07/02/2021 06/30/2021 06/30/2021 06/27/2020 04/26/2020 04/04/2019 03/28/2018  PHQ - 2 Score 0 0 0 0 0 1 0  PHQ- 9 Score - - - 0 - 1 -    Fall Risk Fall Risk  06/30/2021 06/27/2020 04/26/2020 04/04/2019 03/28/2018  Falls in the past year? 0 0 0 0 No  Number falls in past yr: 0 0 0 - -  Injury with Fall? 0 0 0 - -  Follow up Falls evaluation completed Falls evaluation completed;Falls prevention discussed Falls evaluation completed - -    FALL RISK PREVENTION PERTAINING TO THE HOME:  Any stairs in or around the home? Yes  If so, are there any without handrails? No  Home free of loose throw rugs in walkways, pet beds, electrical cords, etc? Yes  Adequate  lighting in your home to reduce risk of falls? Yes   ASSISTIVE DEVICES UTILIZED TO PREVENT FALLS:  Life alert? No  Use of a cane, walker or w/c? No  Grab bars in the bathroom? Yes  Shower chair or bench in shower? Yes  Elevated toilet seat or a handicapped toilet? Yes   TIMED UP AND GO:  Was the test performed? Yes .  Length of time to ambulate 10 feet: 7 sec.   Gait steady and fast without use of assistive device  Cognitive Function:    Normal cognitive status assessed by direct observation by this Nurse Health Advisor. No abnormalities found.      Immunizations Immunization History  Administered Date(s) Administered   Fluad Quad(high Dose 65+) 06/22/2019, 06/30/2021   Influenza Split 08/01/2012   Influenza,inj,Quad PF,6+ Mos 05/26/2013, 06/19/2014, 07/04/2015, 07/15/2016, 07/05/2017, 07/29/2018   PFIZER(Purple Top)SARS-COV-2 Vaccination 11/21/2019, 12/12/2019   Pneumococcal Conjugate-13 04/04/2019   Pneumococcal Polysaccharide-23 04/26/2020   Tdap 02/23/2013   Zoster, Live 02/28/2014    TDAP status: Up to date  Flu Vaccine status: Up to date  Pneumococcal vaccine status: Up to date  Covid-19 vaccine status: Completed vaccines  Qualifies for Shingles Vaccine? Yes   Zostavax completed No   Shingrix Completed?: No.    Education has been provided regarding the importance of this vaccine. Patient has been advised to call insurance company to determine out of pocket expense if they have not yet received this vaccine. Advised may also receive vaccine at local pharmacy or Health Dept. Verbalized acceptance and understanding.  Screening Tests Health Maintenance  Topic Date Due   Zoster Vaccines- Shingrix (1 of 2) Never done   COVID-19 Vaccine (3 - Pfizer risk  series) 01/09/2020   COLONOSCOPY (Pts 45-50yrs Insurance coverage will need to be confirmed)  01/04/2022   TETANUS/TDAP  02/24/2023   INFLUENZA VACCINE  Completed   Hepatitis C Screening  Completed   HPV  VACCINES  Aged Out    Health Maintenance  Health Maintenance Due  Topic Date Due   Zoster Vaccines- Shingrix (1 of 2) Never done   COVID-19 Vaccine (3 - Pfizer risk series) 01/09/2020    Colorectal cancer screening: Type of screening: Colonoscopy. Completed 01/04/2017. Repeat every 5 years  Lung Cancer Screening: (Low Dose CT Chest recommended if Age 48-80 years, 30 pack-year currently smoking OR have quit w/in 15years.) does not qualify.   Lung Cancer Screening Referral: n/a  Additional Screening:  Hepatitis C Screening: does not qualify; Completed 01/22/2016  Vision Screening: Recommended annual ophthalmology exams for early detection of glaucoma and other disorders of the eye. Is the patient up to date with their annual eye exam?  Yes  Who is the provider or what is the name of the office in which the patient attends annual eye exams? Dr.Lyles  If pt is not established with a provider, would they like to be referred to a provider to establish care? No .   Dental Screening: Recommended annual dental exams for proper oral hygiene  Community Resource Referral / Chronic Care Management: CRR required this visit?  No   CCM required this visit?  No      Plan:     I have personally reviewed and noted the following in the patient's chart:   Medical and social history Use of alcohol, tobacco or illicit drugs  Current medications and supplements including opioid prescriptions. Patient is not currently taking opioid prescriptions. Functional ability and status Nutritional status Physical activity Advanced directives List of other physicians Hospitalizations, surgeries, and ER visits in previous 12 months Vitals Screenings to include cognitive, depression, and falls Referrals and appointments  In addition, I have reviewed and discussed with patient certain preventive protocols, quality metrics, and best practice recommendations. A written personalized care plan for  preventive services as well as general preventive health recommendations were provided to patient.     Randel Pigg, LPN   14/43/1540   Nurse Notes: none

## 2021-08-06 ENCOUNTER — Telehealth: Payer: Self-pay | Admitting: Pharmacist

## 2021-08-06 NOTE — Chronic Care Management (AMB) (Signed)
Chronic Care Management Pharmacy Assistant   Name: Adam Villanueva  MRN: [id number] DOB: 22-Jul-1951  Reason for Encounter: Disease State / Hypertension Assessment Call   Conditions to be addressed/monitored: HTN  Recent office visits:  06/30/2021 Randel Pigg LPN - Medicare annual wellness exam  Recent consult visits:  None  Hospital visits:  None  Medications: Outpatient Encounter Medications as of 08/06/2021  Medication Sig   atorvastatin (LIPITOR) 20 MG tablet Take 1 tablet (20 mg total) by mouth daily.   budesonide (ENTOCORT EC) 3 MG 24 hr capsule Take 3 capsules (9 mg total) by mouth daily for 90 days, THEN 2 capsules (6 mg total) daily for 30 days, THEN 1 capsule (3 mg total) daily.   cholecalciferol (VITAMIN D) 1000 units tablet Take 1,000 Units by mouth daily.   esomeprazole (NEXIUM) 20 MG capsule 20 mg. Uses only PRN for flare ups.   ibuprofen (ADVIL,MOTRIN) 200 MG tablet Take 400 mg by mouth every 6 (six) hours as needed (pain).   lisinopril (ZESTRIL) 40 MG tablet TAKE 1 TABLET BY MOUTH  DAILY   metoprolol succinate (TOPROL-XL) 50 MG 24 hr tablet TAKE 1 TABLET BY MOUTH  DAILY WITH OR IMMEDIATELY  FOLLOWING A MEAL   Multiple Vitamin (MULTIVITAMIN) tablet Take 1 tablet by mouth daily.   nystatin cream (MYCOSTATIN) Apply 1 application twice a day to rash. (Patient not taking: Reported on 06/30/2021)   Omega-3 Fatty Acids (OMEGA-3 FISH OIL PO) Take 2,400 mg by mouth in the morning and at bedtime.   pantoprazole (PROTONIX) 40 MG tablet TAKE 1 TABLET BY MOUTH  DAILY   sildenafil (REVATIO) 20 MG tablet Take 2 to 5 tablets one hour prior to sexual activity.   tamsulosin (FLOMAX) 0.4 MG CAPS capsule Take 0.4 mg by mouth.   Turmeric 400 MG CAPS Take by mouth as needed.   vitamin C (ASCORBIC ACID) 500 MG tablet Take 500 mg by mouth daily.   No facility-administered encounter medications on file as of 08/06/2021.   Fill History:  atorvastatin (LIPITOR) tablet 01/13/2021 90     metoprolol succinate (TOPROL-XL) 24 hr tablet 03/22/2021 90   PANTOPRAZOLE SODIUM  40 MG TBEC 07/27/2020 90   Reviewed chart prior to disease state call. Spoke with patient regarding BP  Recent Office Vitals: BP Readings from Last 3 Encounters:  06/30/21 132/72  05/21/21 112/70  05/12/21 112/68   Pulse Readings from Last 3 Encounters:  06/30/21 79  05/21/21 88  05/12/21 83    Wt Readings from Last 3 Encounters:  06/30/21 192 lb (87.1 kg)  05/21/21 190 lb 6 oz (86.4 kg)  05/12/21 189 lb 6.4 oz (85.9 kg)     Kidney Function Lab Results  Component Value Date/Time   CREATININE 1.04 04/28/2021 09:40 AM   CREATININE 0.92 04/26/2020 09:34 AM   CREATININE 0.96 04/04/2019 10:22 AM   GFR 74.00 04/28/2021 09:40 AM   GFRNONAA 87 (L) 01/29/2014 06:41 PM   GFRAA >90 01/29/2014 06:41 PM    BMP Latest Ref Rng & Units 04/28/2021 04/26/2020 04/04/2019  Glucose 70 - 99 mg/dL 92 94 92  BUN 6 - 23 mg/dL 11 12 12   Creatinine 0.40 - 1.50 mg/dL 1.04 0.92 0.96  BUN/Creat Ratio 6 - 22 (calc) - NOT APPLICABLE -  Sodium 578 - 145 mEq/L 138 137 134(L)  Potassium 3.5 - 5.1 mEq/L 4.2 4.0 4.7  Chloride 96 - 112 mEq/L 102 101 97  CO2 19 - 32 mEq/L 27 26 27  Calcium 8.4 - 10.5 mg/dL 8.9 9.0 8.9    Current antihypertensive regimen:  Lisinopril 40 mg daily Metoprolol 50 mg daily  How often are you checking your Blood Pressure? daily  Current home BP readings: Patient states they have been running around 125/75, he doesn't recall actual readings.  What recent interventions/DTPs have been made by any provider to improve Blood Pressure control since last CPP Visit: None  Any recent hospitalizations or ED visits since last visit with CPP? No  What diet changes have been made to improve Blood Pressure Control?  Patient is trying to to eat less carbs.  For breakfast he has been eating toast and eggs, for lunch he eats out or will have yogurt and fruit, for dinner he will have a meat and two  vegetables.  What exercise is being done to improve your Blood Pressure Control?  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 He does a lot of walking.  Adherence Review: Is the patient currently on ACE/ARB medication? Yes Does the patient have >5 day gap between last estimated fill dates? Yes  Care Gaps: AWV - completed 06/30/21 Zoster vaccines - never done COVID-19 vaccine booster 3 - overdue since 01/09/20 Last BP - 132/70 06/30/2021  Star Rating Drugs: Atorvastatin 20mg  - last filled on 01/13/21 90DS  at [facility], verified with pharmacist, never filled at [facility] verified with Ysidro Evert Lisinopril 40mg  - last filled on 06/03/2021 90DS at [facility] verified with [place] [facility] Pharmacist Assistant [phone]

## 2021-08-24 ENCOUNTER — Other Ambulatory Visit: Payer: Self-pay | Admitting: Family Medicine

## 2021-08-24 DIAGNOSIS — I10 Essential (primary) hypertension: Secondary | ICD-10-CM

## 2021-08-27 ENCOUNTER — Telehealth: Payer: Self-pay | Admitting: Pharmacist

## 2021-08-27 NOTE — Chronic Care Management (AMB) (Signed)
    Chronic Care Management Pharmacy Assistant   Name: Adam Villanueva  MRN: [id number] DOB: Nov 25, 1950  Reason for Encounter: Schedule lab visit for Dr. Elease Hashimoto. Spoke with patient, scheduled for Oct 13, 2021.  Patient aware he will need to be fasting, nothing to eat or drink 8 hours prior to appointment however, he can have black coffee or water.   Care Gaps: AWV - completed 06/30/21 Zoster vaccines - never done COVID-19 vaccine - overdue  Last BP - 132/70 06/30/2021  Star Rating Drugs: Atorvastatin 20mg  - last filled on 01/13/21 90DS  at [place], verified with pharmacist, never filled at [facility] verified with Ysidro Evert Lisinopril 40mg  - last filled on 06/03/2021 90DS at [facility] Pharmacist Assistant [phone]

## 2021-10-13 ENCOUNTER — Other Ambulatory Visit: Payer: Medicare Other

## 2021-10-14 ENCOUNTER — Other Ambulatory Visit (INDEPENDENT_AMBULATORY_CARE_PROVIDER_SITE_OTHER): Payer: Medicare Other

## 2021-10-14 DIAGNOSIS — E785 Hyperlipidemia, unspecified: Secondary | ICD-10-CM

## 2021-10-14 LAB — LIPID PANEL
Cholesterol: 237 mg/dL — ABNORMAL HIGH (ref 0–200)
HDL: 35.4 mg/dL — ABNORMAL LOW (ref >39.00)
Total CHOL/HDL Ratio: 7
Triglycerides: 565 mg/dL — ABNORMAL HIGH (ref 0.0–149.0)

## 2021-10-14 LAB — LDL CHOLESTEROL, DIRECT: Direct LDL: 101 mg/dL

## 2021-12-19 ENCOUNTER — Telehealth: Payer: Self-pay | Admitting: Pharmacist

## 2021-12-19 NOTE — Chronic Care Management (AMB) (Signed)
? ? ?  Chronic Care Management ?Pharmacy Assistant  ? ?Name: Adam Villanueva  MRN: [id number] DOB: [birth year] ? ?12/19/21 APPOINTMENT REMINDER ? ? ? ?Patient was reminded to have all medications, supplements and any blood glucose and blood pressure readings available for review with Jeni Salles, Pharm. D, for telephone visit on 12/22/21 at 1. ? ? ? ?Care Gaps: ?Zoster Vaccine - Overdue ?COVID Booster - Overdue ?BP- 125/75 ( 08/06/21) ?AWV- 10/22 ? ?Star Rating Drug: ?Atorvastatin '20mg'$  - last filled on 11/24/21 90 DS  at [facility]  ?Lisinopril '40mg'$  - last filled on 11/24/21 100 DS at [facility]  ? ?Any gaps in medications fill history? ?none ? ? ?SIG:   ? ?Medications: ?Outpatient Encounter Medications as of 12/19/2021  ?Medication Sig  ? pantoprazole (PROTONIX) 40 MG tablet TAKE 1 TABLET BY MOUTH  DAILY  ? atorvastatin (LIPITOR) 20 MG tablet Take 1 tablet (20 mg total) by mouth daily.  ? cholecalciferol (VITAMIN D) 1000 units tablet Take 1,000 Units by mouth daily.  ? esomeprazole (NEXIUM) 20 MG capsule 20 mg. Uses only PRN for flare ups.  ? ibuprofen (ADVIL,MOTRIN) 200 MG tablet Take 400 mg by mouth every 6 (six) hours as needed (pain).  ? lisinopril (ZESTRIL) 40 MG tablet TAKE 1 TABLET BY MOUTH  DAILY  ? metoprolol succinate (TOPROL-XL) 50 MG 24 hr tablet TAKE 1 TABLET BY MOUTH  DAILY WITH OR IMMEDIATELY  FOLLOWING A MEAL  ? Multiple Vitamin (MULTIVITAMIN) tablet Take 1 tablet by mouth daily.  ? nystatin cream (MYCOSTATIN) Apply 1 application twice a day to rash. (Patient not taking: Reported on 06/30/2021)  ? Omega-3 Fatty Acids (OMEGA-3 FISH OIL PO) Take 2,400 mg by mouth in the morning and at bedtime.  ? sildenafil (REVATIO) 20 MG tablet Take 2 to 5 tablets one hour prior to sexual activity.  ? tamsulosin (FLOMAX) 0.4 MG CAPS capsule Take 0.4 mg by mouth.  ? Turmeric 400 MG CAPS Take by mouth as needed.  ? vitamin C (ASCORBIC ACID) 500 MG tablet Take 500 mg by mouth daily.  ? ?No facility-administered encounter  medications on file as of 12/19/2021.  ? ? ? ?Ned Clines CMA ?Clinical Pharmacist Assistant ?[phone] ? ?

## 2021-12-22 ENCOUNTER — Ambulatory Visit (INDEPENDENT_AMBULATORY_CARE_PROVIDER_SITE_OTHER): Payer: Medicare Other | Admitting: Pharmacist

## 2021-12-22 DIAGNOSIS — I10 Essential (primary) hypertension: Secondary | ICD-10-CM

## 2021-12-22 DIAGNOSIS — E785 Hyperlipidemia, unspecified: Secondary | ICD-10-CM

## 2021-12-22 DIAGNOSIS — Z1211 Encounter for screening for malignant neoplasm of colon: Secondary | ICD-10-CM

## 2021-12-22 NOTE — Patient Instructions (Signed)
Hi Adam Villanueva, ? ?It was great to catch up with you again! Please keep on working on changes to your diet to lower your cholesterol. ? ?Please reach out to me if you have any questions or need anything! ? ?Best, ?Maddie ? ?Jeni Salles, PharmD, BCACP ?Clinical Pharmacist ?Therapist, music at [facility] ?[phone] ? ? Visit Information ? ? Goals Addressed   ?None ?  ? ?Patient Care Plan: [place]  ?  ? ?Problem Identified: Problem: Hypertension, Hyperlipidemia, GERD, Osteoarthritis, BPH and insomnia   ?  ? ?Long-Range Goal: Patient-Specific Goal   ?Start Date: 12/25/2020  ?Expected End Date: 12/25/2021  ?Recent Progress: On track  ?Priority: High  ?Note:   ?Current Barriers:  ?Unable to independently monitor therapeutic efficacy ?Unable to achieve control of cholesterol  ? ?Pharmacist Clinical Goal(s):  ?Patient will achieve adherence to monitoring guidelines and medication adherence to achieve therapeutic efficacy ?achieve control of cholesterol as evidenced by next lipid panel  through collaboration with PharmD and provider.  ? ?Interventions: ?1:1 collaboration with Eulas Post, MD regarding development and update of comprehensive plan of care as evidenced by provider attestation and co-signature ?Inter-disciplinary care team collaboration (see longitudinal plan of care) ?Comprehensive medication review performed; medication list updated in electronic medical record ? ?Hypertension (BP goal <140/90) ?-Controlled ?-Current treatment: ?Lisinopril 40 mg 1 tablet daily - Appropriate, Effective, Safe, Accessible ?Metoprolol succinate 50 mg 1 tablet daily - Appropriate, Effective, Safe, Accessible ?-Medications previously tried: n/a  ?-Current home readings: < 130/80 (checking once a week) ?-Current dietary habits: using salt more than he used to; recommended lower sodium salt ?-Current exercise habits: walking 3 days a week ?-Reports hypotensive/hypertensive symptoms ?-Educated on Daily salt  intake goal < 2300 mg; ?Exercise goal of 150 minutes per week; ?Importance of home blood pressure monitoring; ?Proper BP monitoring technique; ?-Counseled to monitor BP at home weekly, document, and provide log at future appointments ?-Counseled on diet and exercise extensively ?Recommended to continue current medication ? ?Hyperlipidemia: (LDL goal < 100) ?-Uncontrolled ?-Current treatment: ?Fish oil daily 1000 mg 1 capsule daily - Appropriate, Query effective, Safe, Accessible ?Atorvastatin 20 mg 1 tablet daily -Appropriate, Query effective, Safe, Accessible ?-Medications previously tried: rosuvastatin (back pain), simvastatin  ?-Current dietary patterns: eats more fiber in the winter; uses olive oil for frying and sometimes uses butter; fries food 3-4 times a week; recommended use of an air fryer ?-Current exercise habits: walking 3 days a week for 2-2.5 miles ?-Educated on Cholesterol goals;  ?Benefits of statin for ASCVD risk reduction; ?Importance of limiting foods high in cholesterol; ?Exercise goal of 150 minutes per week; ?-Counseled on diet and exercise extensively ?Recommended to continue current medication ?Recommended repeat lipid panel and consider addition of Vascepa if TGs are elevated. ? ?GERD (Goal: minimize symptoms) ?-Uncontrolled ?-Current treatment  ?Esomeprazole 20 mg 1 capsule as needed - Query Appropriate, Effective, Safe, Accessible ?Pantoprazole 40 mg 1 capsule every other day - Appropriate, Effective, Safe, Accessible ?-Medications previously tried: none ?-Counseled on not using esomeprazole and pantoprazole on the same days ?Educated on non-pharmacologic management of symptoms such as elevating the head of your bed, avoiding eating 2-3 hours before bed, avoiding triggering foods such as acidic, spicy, or fatty foods, eating smaller meals, and wearing clothes that are loose around the waist ? ?BPH (Goal: minimize symptoms) ?-Controlled ?-Current treatment  ?tamsulosin 0.4 mg 1 capsule  daily as needed - Appropriate, Query effective, Safe, Accessible ?-Medications previously tried: none  ?-Counseled on taking after a meal  to prevent BP dropping. ? ?Osteoarthritis (Goal: minimize pain) ?-Not ideally controlled ?-Current treatment  ?Ibuprofen 200 mg as needed - Appropriate, Effective, Safe, Accessible ?Aspirin every once in a while (BC powder) - Appropriate, Effective, Safe, Accessible ?Tylenol 500 mg as needed ?-Medications previously tried: n/a  ?-Recommended diclofenac (voltaren gel) for joint pain ? ?Health Maintenance ?-Vaccine gaps: shingrix (2nd dose) ?-Current therapy:  ?Vitamin D and calcium 1000 units 1 tablet daily ?Multivitamin 1 tablet daily - not every day ?Turmeric 400 mg 1 capsule as needed ?Vitamin C 500 mg 1 tablet daily ?Supplements: Livaplex, albaplex, betafood, cataplex, symplex, cyruta ?-Educated on Cost vs benefit of each product must be carefully weighed by individual consumer ?-Patient is satisfied with current therapy and denies issues ?-Recommended to continue current medication ? ?Patient Goals/Self-Care Activities ?Patient will:  ?- take medications as prescribed ?check blood pressure weekly, document, and provide at future appointments ?target a minimum of 150 minutes of moderate intensity exercise weekly ?engage in dietary modifications by adding more fiber to his diet to lower cholesterol ? ?Follow Up Plan: The care management team will reach out to the patient again over the next 7 days.   ? ?  ?  ? ?Patient verbalizes understanding of instructions and care plan provided today and agrees to view in [facility]. Active MyChart status confirmed with patient.   ?The pharmacy team will reach out to the patient again over the next 7 days.  ? ?Viona Gilmore, RPH  ?

## 2021-12-22 NOTE — Addendum Note (Signed)
Addended by: Eulas Post on: 12/22/2021 09:21 PM ? ? Modules accepted: Orders ? ?

## 2021-12-22 NOTE — Progress Notes (Signed)
? ?Chronic Care Management ?Pharmacy Note ? ?12/22/2021 ?Name:  Adam Villanueva MRN:  [id number] DOB:  [birth year] ? ?Summary: ?LDL is not at goal < 100 ?TGs > 500 ?BP is at goal < 130/80 ?  ?Recommendations/Changes made from today's visit: ?-Recommended repeat lipid panel and consider addition of Zetia or PCSK9 if LDL > 100 ?-Recommend switching OTC fish oil to Vascepa if TGs > 500 at recheck ?  ?Plan: ?HLD assessment in 2 months ?Follow up in 6 months ? ?Subjective: ?Adam Villanueva is an 71 y.o. year old male who is a primary patient of Burchette, Alinda Sierras, MD.  The CCM team was consulted for assistance with disease management and care coordination needs.   ? ?Engaged with patient by telephone for follow up visit in response to provider referral for pharmacy case management and/or care coordination services.  ? ?Consent to Services:  ?The patient was given information about Chronic Care Management services, agreed to services, and gave verbal consent prior to initiation of services.  Please see initial visit note for detailed documentation.  ? ?Patient Care Team: ?Eulas Post, MD as PCP - General (Family Medicine) ?Viona Gilmore, [facility] as Pharmacist (Pharmacist) ? ?Recent office visits: ?06/30/21 Randel Pigg, LPN: Patient presented for AWV. ? ?05/12/21 Grier Mitts, MD: Patient presented for hemorrhoids. Prescribed Nystatin and hydrocortisone 25 mg. ? ?04/28/21 Carolann Littler, MD: Patient presented for annual exam. PSA was elevated - recommended follow up with urology. ? ?Recent consult visits: ?06/25/21 Bjorn Loser (urology): Patient presented for follow up. Unable to access notes. ? ?05/21/21 Scarlette Shorts, MD (gastro): Patient presented for hemorrhoids follow up. Continue to taper budesonide. ? ?04/08/21 Scarlette Shorts, MD (gastro): Patient presented for lymphocytic colitis follow up. Decrease budesonide to 6 mg daily x 1 month and then 3 mg daily x 1 month and then stop. ? ?11/28/20 Scarlette Shorts, MD (gastro):  Patient presented for lymphocytic colitis follow up. Continue budesonide 3 mg daily for 2-3 weeks then stop. ? ?Hospital visits: ?None in previous 6 months ? ?Objective: ? ?Lab Results  ?Component Value Date  ? CREATININE 1.04 04/28/2021  ? BUN 11 04/28/2021  ? GFR 74.00 04/28/2021  ? GFRNONAA 87 (L) 01/29/2014  ? GFRAA >90 01/29/2014  ? NA 138 04/28/2021  ? K 4.2 04/28/2021  ? CALCIUM 8.9 04/28/2021  ? CO2 27 04/28/2021  ? GLUCOSE 92 04/28/2021  ? ? ?Lab Results  ?Component Value Date/Time  ? HGBA1C 5.6 01/22/2016 10:38 AM  ? GFR 74.00 04/28/2021 09:40 AM  ? GFR 78.36 04/04/2019 10:22 AM  ?  ?Last diabetic Eye exam: No results found for: HMDIABEYEEXA  ?Last diabetic Foot exam: No results found for: HMDIABFOOTEX  ? ?Lab Results  ?Component Value Date  ? CHOL 237 (H) 10/14/2021  ? HDL 35.40 (L) 10/14/2021  ? LDLCALC 114 (H) 03/04/2021  ? LDLDIRECT 101.0 10/14/2021  ? TRIG (H) 10/14/2021  ?  565.0 Triglyceride is over 400; calculations on Lipids are invalid.  ? CHOLHDL 7 10/14/2021  ? ? ? ?  Latest Ref Rng & Units 03/04/2021  ?  8:37 AM 04/26/2020  ?  9:34 AM 04/04/2019  ? 10:22 AM  ?Hepatic Function  ?Total Protein 6.0 - 8.3 g/dL 6.5   6.8   6.9    ?Albumin 3.5 - 5.2 g/dL 4.2    4.4    ?AST 0 - 37 U/L _0 ?ALT 0 - 53 U/L 17  22   18    ?Alk Phosphatase 39 - 117 U/L 69    75    ?Total Bilirubin 0.2 - 1.2 mg/dL 0.6   0.6   0.6    ?Bilirubin, Direct 0.0 - 0.3 mg/dL 0.1   0.1   0.1    ? ? ?Lab Results  ?Component Value Date/Time  ? TSH 1.36 04/28/2021 09:40 AM  ? TSH 0.87 04/26/2020 09:34 AM  ? ? ? ?  Latest Ref Rng & Units 04/28/2021  ?  9:40 AM 04/11/2020  ?  8:42 AM 04/04/2019  ? 10:22 AM  ?CBC  ?WBC 4.0 - 10.5 K/uL 4.7   4.8   4.4    ?Hemoglobin 13.0 - 17.0 g/dL 16.7   17.0   16.4    ?Hematocrit 39.0 - 52.0 % 47.5   48.0   47.8    ?Platelets 150.0 - 400.0 K/uL 212.0   226.0   215.0    ? ? ?No results found for: VD25OH ? ?Clinical ASCVD: No  ?The 10-year ASCVD risk score (Arnett DK, et al., 2019) is: 24.7% ?   Values used to calculate the score: ?    Age: 71 years ?    Sex: Male ?    Is Non-Hispanic African American: No ?    Diabetic: No ?    Tobacco smoker: No ?    Systolic Blood Pressure: 433 mmHg ?    Is BP treated: Yes ?    HDL Cholesterol: 35.4 mg/dL ?    Total Cholesterol: 237 mg/dL   ? ? ?  07/02/2021  ?  4:10 PM 06/30/2021  ? 10:02 AM 06/30/2021  ?  9:59 AM  ?Depression screen PHQ 2/9  ?Decreased Interest 0 0 0  ?Down, Depressed, Hopeless 0 0 0  ?PHQ - 2 Score 0 0 0  ?  ? ? ?Social History  ? ?Tobacco Use  ?Smoking Status Never  ?Smokeless Tobacco Never  ? ?BP Readings from Last 3 Encounters:  ?06/30/21 132/72  ?05/21/21 112/70  ?05/12/21 112/68  ? ?Pulse Readings from Last 3 Encounters:  ?06/30/21 79  ?05/21/21 88  ?05/12/21 83  ? ?Wt Readings from Last 3 Encounters:  ?06/30/21 192 lb (87.1 kg)  ?05/21/21 190 lb 6 oz (86.4 kg)  ?05/12/21 189 lb 6.4 oz (85.9 kg)  ? ?BMI Readings from Last 3 Encounters:  ?06/30/21 27.55 kg/m?  ?05/21/21 27.32 kg/m?  ?05/12/21 27.18 kg/m?  ? ? ?Assessment/Interventions: Review of patient past medical history, allergies, medications, health status, including review of consultants reports, laboratory and other test data, was performed as part of comprehensive evaluation and provision of chronic care management services.  ? ?SDOH:  (Social Determinants of Health) assessments and interventions performed: Yes ? ? ?SDOH Screenings  ? ?Alcohol Screen: Low Risk   ? Last Alcohol Screening Score (AUDIT): 1  ?Depression (PHQ2-9): Low Risk   ? PHQ-2 Score: 0  ?Financial Resource Strain: Low Risk   ? Difficulty of Paying Living Expenses: Not hard at all  ?Food Insecurity: No Food Insecurity  ? Worried About Charity fundraiser in the Last Year: Never true  ? Ran Out of Food in the Last Year: Never true  ?Housing: Low Risk   ? Last Housing Risk Score: 0  ?Physical Activity: Sufficiently Active  ? Days of Exercise per Week: 4 days  ? Minutes of Exercise per Session: 60 min  ?Social Connections:  Moderately Integrated  ? Frequency of Communication with Friends and Family:  Twice a week  ? Frequency of Social Gatherings with Friends and Family: Twice a week  ? Attends Religious Services: More than 4 times per year  ? Active Member of Clubs or Organizations: No  ? Attends Archivist Meetings: Never  ? Marital Status: Married  ?Stress: No Stress Concern Present  ? Feeling of Stress : Not at all  ?Tobacco Use: Low Risk   ? Smoking Tobacco Use: Never  ? Smokeless Tobacco Use: Never  ? Passive Exposure: Not on file  ?Transportation Needs: No Transportation Needs  ? Lack of Transportation (Medical): No  ? Lack of Transportation (Non-Medical): No  ? ?CCM Care Plan ? ?No Known Allergies ? ?Medications Reviewed Today   ? ? Reviewed by Randel Pigg, LPN (Licensed Practical Nurse) on 06/30/21 at [phone]  Med List Status: <None>  ? ?Medication Order Taking? Sig Documenting Provider Last Dose Status Informant  ?atorvastatin (LIPITOR) 20 MG tablet [id number] Yes Take 1 tablet (20 mg total) by mouth daily. Burchette, Alinda Sierras, MD Taking Active   ?budesonide (ENTOCORT EC) 3 MG 24 hr capsule [id number] Yes Take 3 capsules (9 mg total) by mouth daily for 90 days, THEN 2 capsules (6 mg total) daily for 30 days, THEN 1 capsule (3 mg total) daily. Lavena Bullion, DO Taking Active   ?cholecalciferol (VITAMIN D) 1000 units tablet [id number] Yes Take 1,000 Units by mouth daily. [provider] Taking Active   ?esomeprazole (NEXIUM) 20 MG capsule [id number] Yes 20 mg. Uses only PRN for flare ups. [provider] Taking Active   ?ibuprofen (ADVIL,MOTRIN) 200 MG tablet [id number] Yes Take 400 mg by mouth every 6 (six) hours as needed (pain). [provider] Taking Active Self  ?lisinopril (ZESTRIL) 40 MG tablet [id number] Yes TAKE 1 TABLET BY MOUTH  DAILY Burchette, Alinda Sierras, MD Taking Active   ?metoprolol succinate (TOPROL-XL) 50 MG 24 hr tablet [id number] Yes TAKE 1 TABLET BY MOUTH  DAILY WITH OR  IMMEDIATELY  FOLLOWING A MEAL Burchette, Alinda Sierras, MD Taking Active   ?Multiple Vitamin (MULTIVITAMIN) tablet [id number] Yes Take 1 tablet by mouth daily. [provider] Taking Active   ?nystatin cream (MYCOSTATIN) 36

## 2021-12-26 DIAGNOSIS — I10 Essential (primary) hypertension: Secondary | ICD-10-CM | POA: Diagnosis not present

## 2021-12-26 DIAGNOSIS — E785 Hyperlipidemia, unspecified: Secondary | ICD-10-CM

## 2021-12-30 ENCOUNTER — Telehealth: Payer: Self-pay | Admitting: Internal Medicine

## 2021-12-30 NOTE — Telephone Encounter (Signed)
Received a call from patient who is complaining of loose stools, nausea, and abdominal discomfort.  He has issues with colitis and is "trying to get ahead of it before it becomes worse." Please call and advise.  Thank you. ?

## 2021-12-30 NOTE — Telephone Encounter (Signed)
Spoke with Adam Villanueva, he states he has been having loose stools, fatigue, nausea and decreased appetite for at least 10 days. No blood in the stool and no fever. He states he has had these symptoms before and taken budesonide. He wasn't sure if Dr. Henrene Pastor would want him to resume the budesonide or not. Please advise. ?

## 2021-12-30 NOTE — Telephone Encounter (Signed)
1.  Have him submit stool study for C. difficile ?2.  Initiate budesonide 9 mg daily ?3.  Have him follow-up with me in the office in 4 weeks or so.  Stay on budesonide 9 mg daily until that visit ?4.  Okay to use Imodium ?

## 2021-12-31 ENCOUNTER — Other Ambulatory Visit: Payer: Medicare Other

## 2021-12-31 ENCOUNTER — Other Ambulatory Visit: Payer: Self-pay

## 2021-12-31 DIAGNOSIS — K52832 Lymphocytic colitis: Secondary | ICD-10-CM

## 2021-12-31 DIAGNOSIS — R197 Diarrhea, unspecified: Secondary | ICD-10-CM

## 2021-12-31 MED ORDER — BUDESONIDE 3 MG PO CPEP: 9.0000 mg | ORAL_CAPSULE | Freq: Every day | ORAL | 2 refills | Status: DC

## 2021-12-31 NOTE — Telephone Encounter (Signed)
Spoke with pt and he is aware or Dr. Blanch Media recommendations. Lab order in epic, prescription sent to pharmacy. Pt scheduled to see Dr. Henrene Pastor 01/29/22 at 10:40am. Pt aware. ?

## 2022-01-05 ENCOUNTER — Other Ambulatory Visit: Payer: Self-pay

## 2022-01-05 DIAGNOSIS — R197 Diarrhea, unspecified: Secondary | ICD-10-CM

## 2022-01-05 NOTE — Telephone Encounter (Signed)
Inbound call from patient. States he need another stool kit ordered as he planned on bringing it Friday not realizing the office was closed.  ?

## 2022-01-05 NOTE — Telephone Encounter (Signed)
New order in epic, pt aware. ?

## 2022-01-06 ENCOUNTER — Other Ambulatory Visit: Payer: Medicare Other

## 2022-01-06 DIAGNOSIS — R197 Diarrhea, unspecified: Secondary | ICD-10-CM

## 2022-01-08 LAB — CLOSTRIDIUM DIFFICILE BY PCR: Toxigenic C. Difficile by PCR: NEGATIVE

## 2022-01-24 ENCOUNTER — Other Ambulatory Visit: Payer: Self-pay | Admitting: Family Medicine

## 2022-01-24 DIAGNOSIS — I10 Essential (primary) hypertension: Secondary | ICD-10-CM

## 2022-01-29 ENCOUNTER — Ambulatory Visit: Payer: Medicare Other | Admitting: Internal Medicine

## 2022-01-29 ENCOUNTER — Encounter: Payer: Self-pay | Admitting: Internal Medicine

## 2022-01-29 VITALS — BP 150/60 | HR 78 | Ht 70.0 in | Wt 192.0 lb

## 2022-01-29 DIAGNOSIS — R197 Diarrhea, unspecified: Secondary | ICD-10-CM

## 2022-01-29 DIAGNOSIS — R140 Abdominal distension (gaseous): Secondary | ICD-10-CM | POA: Diagnosis not present

## 2022-01-29 DIAGNOSIS — K52832 Lymphocytic colitis: Secondary | ICD-10-CM

## 2022-01-29 NOTE — Patient Instructions (Signed)
If you are age 69 or older, your body mass index should be between 23-30. Your Body mass index is 27.55 kg/m?Marland Kitchen If this is out of the aforementioned range listed, please consider follow up with your Primary Care Provider. ? ?If you are age 54 or younger, your body mass index should be between 19-25. Your Body mass index is 27.55 kg/m?Marland Kitchen If this is out of the aformentioned range listed, please consider follow up with your Primary Care Provider.  ? ?________________________________________________________ ? ?The [facility] providers would like to encourage you to use [facility] to communicate with providers for non-urgent requests or questions.  Due to long hold times on the telephone, sending your provider a message by [facility] may be a faster and more efficient way to get a response.  Please allow 48 business hours for a response.  Please remember that this is for non-urgent requests.  ?_______________________________________________________ ? ?Continue your Budesonide taper:  '6mg'$  for 3 weeks then '3mg'$  for 3 weeks then stop. ? ?You will be due for a colonoscopy next year ?

## 2022-01-29 NOTE — Progress Notes (Signed)
HISTORY OF PRESENT ILLNESS: ? [name] is a 71 y.o. male with a history of lymphocytic colitis, thrombosed hemorrhoid, GERD, and hyperplastic cecal polyp who presents today for follow-up after contacting the office in early April 2023 with persistent diarrhea.  The patient was last seen in this office May 21, 2021 regarding thrombosed hemorrhoid and lymphocytic colitis.  See that dictation for details.  Follow-up in 1 year was recommended.  He tells me that he successfully tapered off budesonide and had been doing well. ? ?However, patient contacted the office around December 30, 2021 reporting a 10-day history of diarrhea.  Similar to his prior colitis.  He submitted stool studies for C. difficile which were negative.  He was placed on budesonide 9 mg daily.  Patient tells me that within less than 1 week his diarrhea resolved.  He took 9 mg of budesonide daily for about 3 weeks.  Earlier this week he decreased the dosage to 6 mg daily.  He is reporting 1 regular bowel movement per day.  His only other GI complaint is that of bloating and gas.  This complaint is not new nor progressive.  His weight has been stable.  He continues on Nexium for his GERD.  This controls symptoms well.  His last colonoscopy was April 2018.  Due to the size of the right-sided hyperplastic polyp, follow-up in 5 years recommended.  Patient asked if he may not postpone his colonoscopy for another year. ? ?Review of blood work from August 2022 shows normal CBC with hemoglobin 16.7.  Abdominal ultrasound March 2022 was negative except for the suggestion of fatty liver. ? ?REVIEW OF SYSTEMS: ? ?All non-GI ROS negative unless otherwise stated in the HPI except for arthritis, anxiety, back pain, visual change, headaches ? ?Past Medical History:  ?Diagnosis Date  ? Allergy   ? Anxiety   ? Arthritis   ? Cataract   ? Chronic insomnia   ? GERD (gastroesophageal reflux disease)   ? Hyperlipidemia   ? Hypertension   ? Lower back pain   ? and  mid back pain  ? Prostatitis   ? Sleep apnea   ? no cpap  ? ? ?Past Surgical History:  ?Procedure Laterality Date  ? COLONOSCOPY    ? POLYPECTOMY    ? SEPTOPLASTY    ? broken nose  ? WISDOM TOOTH EXTRACTION    ? ? ?Social History ?[name]  reports that he has never smoked. He has never used smokeless tobacco. He reports current alcohol use. He reports that he does not use drugs. ? ?family history includes Alzheimer's disease in his father; Arthritis in his father, maternal grandfather, maternal grandmother, mother, paternal grandfather, and paternal grandmother; Hyperlipidemia in his mother; Hypertension in his father and mother; Rectal cancer in his mother; Throat cancer in his brother. ? ?No Known Allergies ? ?  ? ?PHYSICAL EXAMINATION: ?Vital signs: BP (!) 150/60   Pulse 78   Ht '5\' 10"'$  (1.778 m)   Wt 192 lb (87.1 kg)   BMI 27.55 kg/m?   ?Constitutional: generally well-appearing, no acute distress ?Psychiatric: alert and oriented x3, cooperative ?Eyes: extraocular movements intact, anicteric, conjunctiva pink ?Mouth: oral pharynx moist, no lesions ?Neck: supple no lymphadenopathy ?Cardiovascular: heart regular rate and rhythm, no murmur ?Lungs: clear to auscultation bilaterally ?Abdomen: soft, nontender, nondistended, no obvious ascites, no peritoneal signs, normal bowel sounds, no organomegaly ?Rectal: Omitted ?Extremities: no clubbing, cyanosis, or lower extremity edema bilaterally ?Skin: no lesions on visible extremities ?Neuro:  No focal deficits.  Cranial nerves intact ? ?ASSESSMENT: ? ?1.  Lymphocytic colitis.  Recent flare.  This has responded promptly to budesonide therapy. ?2.  GERD.  Symptoms controlled on low-dose Nexium.  Uses this on demand. ?3.  History of thrombosed hemorrhoid ?4.  History of hyperplastic cecal polyp 2018 ? ? ?PLAN: ? ?1.  Continue budesonide taper.  I have recommended 6 mg for another 2 weeks, then 3 mg for 3 weeks, then stop ?2.  Reflux precautions ?3.  On-demand  PPI ?4.  Okay to put surveillance colonoscopy out another year. ?5.  Routine office follow-up 1 year ?A total time of 30 minutes was spent preparing to see the patient, reviewing test, obtaining comprehensive history, performing medically appropriate physical examination, counseling and educating the patient regarding the above listed issues, prescribing medication and directing medical therapy, providing follow-up parameters, and documenting clinical information in the health record. ? ? ? ?  ?

## 2022-02-02 ENCOUNTER — Other Ambulatory Visit: Payer: Self-pay | Admitting: Family Medicine

## 2022-02-02 DIAGNOSIS — I10 Essential (primary) hypertension: Secondary | ICD-10-CM

## 2022-02-13 ENCOUNTER — Encounter: Payer: Self-pay | Admitting: Internal Medicine

## 2022-03-02 ENCOUNTER — Ambulatory Visit: Payer: Medicare Other | Admitting: Family Medicine

## 2022-03-23 ENCOUNTER — Other Ambulatory Visit: Payer: Self-pay | Admitting: Family Medicine

## 2022-04-01 ENCOUNTER — Other Ambulatory Visit: Payer: Self-pay | Admitting: Gastroenterology

## 2022-04-09 ENCOUNTER — Other Ambulatory Visit: Payer: Self-pay | Admitting: Family Medicine

## 2022-04-09 DIAGNOSIS — I10 Essential (primary) hypertension: Secondary | ICD-10-CM

## 2022-04-13 ENCOUNTER — Telehealth: Payer: Self-pay | Admitting: Pharmacist

## 2022-04-13 NOTE — Chronic Care Management (AMB) (Signed)
Chronic Care Management Pharmacy Assistant   Name: Adam Villanueva  MRN: [id number] DOB: May 11, 1951  Reason for Encounter: Disease State / Hypertension Assessment Call   Conditions to be addressed/monitored: HTN  Recent office visits:  None  Recent consult visits:  01/29/2022 Scarlette Shorts MD (GI) - Patient was seen for Lymphocytic colitis and additional issues. Discontinued Nystatin and Sildenafil. Continue your Budesonide taper:  '6mg'$  for 3 weeks then '3mg'$  for 3 weeks then stop. No follow up noted.   Hospital visits:  None  Medications: Outpatient Encounter Medications as of 04/13/2022  Medication Sig   atorvastatin (LIPITOR) 20 MG tablet TAKE 1 TABLET BY MOUTH DAILY   budesonide (ENTOCORT EC) 3 MG 24 hr capsule Take 3 capsules (9 mg total) by mouth daily.   cholecalciferol (VITAMIN D) 1000 units tablet Take 1,000 Units by mouth daily.   esomeprazole (NEXIUM) 20 MG capsule 20 mg. Uses only PRN for flare ups.   ibuprofen (ADVIL,MOTRIN) 200 MG tablet Take 400 mg by mouth every 6 (six) hours as needed (pain).   lisinopril (ZESTRIL) 40 MG tablet TAKE 1 TABLET BY MOUTH  DAILY   metoprolol succinate (TOPROL-XL) 50 MG 24 hr tablet TAKE 1 TABLET BY MOUTH  DAILY WITH OR IMMEDIATELY  FOLLOWING A MEAL   Multiple Vitamin (MULTIVITAMIN) tablet Take 1 tablet by mouth daily.   Omega-3 Fatty Acids (OMEGA-3 FISH OIL PO) Take 2,400 mg by mouth in the morning and at bedtime.   pantoprazole (PROTONIX) 40 MG tablet TAKE 1 TABLET BY MOUTH DAILY   tamsulosin (FLOMAX) 0.4 MG CAPS capsule Take 0.4 mg by mouth.   Turmeric 400 MG CAPS Take by mouth as needed.   vitamin C (ASCORBIC ACID) 500 MG tablet Take 500 mg by mouth daily.   No facility-administered encounter medications on file as of 04/13/2022.  Fill History: atorvastatin 20 mg tablet 03/23/2022 90   budesonide DR - ER 3 mg capsule,delayed,extended release 04/02/2022 15   LISINOPRIL  40 MG TABS 02/21/2022 80   METOPROLOL SUCCINATE ER  50 MG  TB24 02/21/2022 100   PANTOPRAZOLE SODIUM  40 MG TBEC 03/03/2022 100   tamsulosin 0.4 mg capsule 03/14/2022 30   Reviewed chart prior to disease state call. Spoke with patient regarding BP  Recent Office Vitals: BP Readings from Last 3 Encounters:  01/29/22 (!) 150/60  06/30/21 132/72  05/21/21 112/70   Pulse Readings from Last 3 Encounters:  01/29/22 78  06/30/21 79  05/21/21 88    Wt Readings from Last 3 Encounters:  01/29/22 192 lb (87.1 kg)  06/30/21 192 lb (87.1 kg)  05/21/21 190 lb 6 oz (86.4 kg)     Kidney Function Lab Results  Component Value Date/Time   CREATININE 1.04 04/28/2021 09:40 AM   CREATININE 0.92 04/26/2020 09:34 AM   CREATININE 0.96 04/04/2019 10:22 AM   GFR 74.00 04/28/2021 09:40 AM   GFRNONAA 87 (L) 01/29/2014 06:41 PM   GFRAA >90 01/29/2014 06:41 PM       Latest Ref Rng & Units 04/28/2021    9:40 AM 04/26/2020    9:34 AM 04/04/2019   10:22 AM  BMP  Glucose 70 - 99 mg/dL 92  94  92   BUN 6 - 23 mg/dL '11  12  12   '$ Creatinine 0.40 - 1.50 mg/dL 1.04  0.92  0.96   BUN/Creat Ratio 6 - 22 (calc)  NOT APPLICABLE    Sodium 998 - 145 mEq/L 138  137  134  Potassium 3.5 - 5.1 mEq/L 4.2  4.0  4.7   Chloride 96 - 112 mEq/L 102  101  97   CO2 19 - 32 mEq/L '27  26  27   '$ Calcium 8.4 - 10.5 mg/dL 8.9  9.0  8.9     Current antihypertensive regimen:  Lisinopril 40 mg daily Metoprolol 50 mg daily  How often are you checking your Blood Pressure? Patient states he checks his blood pressure weekly  Current home BP readings: Patient states his blood pressure on 04/13/2022 was 125/75.  He states this is generally where his blood pressure readings have been.  What recent interventions/DTPs have been made by any provider to improve Blood Pressure control since last CPP Visit: No recent interventions  Any recent hospitalizations or ED visits since last visit with CPP? No recent hospital visits  What diet changes have been made to improve Blood Pressure Control?   Patient follows no specific way of eating Breakfast - patient will have a couple eggs or oatmeal / cereal Lunch - patient will have a sandwich of some sort Dinner - patient will have a meat, 2 vegetables and a starch  What exercise is being done to improve your Blood Pressure Control?  Patient states he walks daily, does some housework and yard work.   Adherence Review: Is the patient currently on ACE/ARB medication? Yes Does the patient have >5 day gap between last estimated fill dates? No  Care Gaps: AWV - completed 06/30/2021 Last BP - 150/60 on 01/29/2022 Shingrix - overdue Colonoscopy - overdue  Star Rating Drugs: Atorvastatin '20mg'$  - last filled 03/23/2022 90 DS at [facility] Lisinopril '40mg'$  - last filled 02/21/2022 80 DS at [facility] Pharmacist Assistant [phone]

## 2022-04-28 ENCOUNTER — Other Ambulatory Visit: Payer: Self-pay | Admitting: Family Medicine

## 2022-04-28 DIAGNOSIS — I10 Essential (primary) hypertension: Secondary | ICD-10-CM

## 2022-05-11 ENCOUNTER — Encounter: Payer: Self-pay | Admitting: Family Medicine

## 2022-05-11 ENCOUNTER — Ambulatory Visit (INDEPENDENT_AMBULATORY_CARE_PROVIDER_SITE_OTHER): Payer: Medicare Other | Admitting: Family Medicine

## 2022-05-11 VITALS — BP 118/62 | HR 79 | Temp 98.2°F | Ht 70.0 in | Wt 194.4 lb

## 2022-05-11 DIAGNOSIS — R233 Spontaneous ecchymoses: Secondary | ICD-10-CM | POA: Diagnosis not present

## 2022-05-11 DIAGNOSIS — M2550 Pain in unspecified joint: Secondary | ICD-10-CM | POA: Diagnosis not present

## 2022-05-11 LAB — CBC WITH DIFFERENTIAL/PLATELET
Basophils Absolute: 0 10*3/uL (ref 0.0–0.1)
Basophils Relative: 0.5 % (ref 0.0–3.0)
Eosinophils Absolute: 0.1 10*3/uL (ref 0.0–0.7)
Eosinophils Relative: 1.8 % (ref 0.0–5.0)
HCT: 47.3 % (ref 39.0–52.0)
Hemoglobin: 16.3 g/dL (ref 13.0–17.0)
Lymphocytes Relative: 19.3 % (ref 12.0–46.0)
Lymphs Abs: 1.1 10*3/uL (ref 0.7–4.0)
MCHC: 34.5 g/dL (ref 30.0–36.0)
MCV: 92 fl (ref 78.0–100.0)
Monocytes Absolute: 0.5 10*3/uL (ref 0.1–1.0)
Monocytes Relative: 8.8 % (ref 3.0–12.0)
Neutro Abs: 4 10*3/uL (ref 1.4–7.7)
Neutrophils Relative %: 69.6 % (ref 43.0–77.0)
Platelets: 195 10*3/uL (ref 150.0–400.0)
RBC: 5.14 Mil/uL (ref 4.22–5.81)
RDW: 13.6 % (ref 11.5–15.5)
WBC: 5.8 10*3/uL (ref 4.0–10.5)

## 2022-05-11 LAB — SEDIMENTATION RATE: Sed Rate: 16 mm/hr (ref 0–20)

## 2022-05-11 NOTE — Progress Notes (Signed)
Established Patient Office Visit  Subjective   Patient ID: Adam Villanueva, male    DOB: Feb 03, 1951  Age: 71 y.o. MRN: [id number]  Chief Complaint  Patient presents with   Hand Pain    Patient complains of hand pain, x1 week    Foot Pain    Patient complains of foot pain, x1 week    Bleeding/Bruising    HPI   Mr.  Adam Villanueva is seen for the following issues  He relates several weeks of easy bruising especially on his forearms.  He does take baby aspirin most days and also until 2 weeks ago was taking budesonide. Denies any nosebleeds, gum bleeds, generalized bruising.  No known history of thrombocytopenia  Other issues bilateral hand and foot pain past several weeks.  No visible swelling.  Some stiffness.  Pain relatively diffuse.  His mother had rheumatoid arthritis. No new skin rashes.  No fevers or chills.  No reported tick bites.  Past Medical History:  Diagnosis Date   Allergy    Anxiety    Arthritis    Cataract    Chronic insomnia    GERD (gastroesophageal reflux disease)    Hyperlipidemia    Hypertension    Lower back pain    and mid back pain   Prostatitis    Sleep apnea    no cpap   Past Surgical History:  Procedure Laterality Date   COLONOSCOPY     POLYPECTOMY     SEPTOPLASTY     broken nose   WISDOM TOOTH EXTRACTION      reports that he has never smoked. He has never used smokeless tobacco. He reports current alcohol use. He reports that he does not use drugs. family history includes Alzheimer's disease in his father; Arthritis in his father, maternal grandfather, maternal grandmother, mother, paternal grandfather, and paternal grandmother; Hyperlipidemia in his mother; Hypertension in his father and mother; Rectal cancer in his mother; Throat cancer in his brother. No Known Allergies    Review of Systems  Constitutional:  Negative for chills, fever and weight loss.  Eyes:  Negative for blurred vision.  Respiratory:  Negative for shortness of breath.    Cardiovascular:  Negative for chest pain.  Gastrointestinal:  Negative for abdominal pain.  Musculoskeletal:  Positive for joint pain.  Neurological:  Negative for dizziness and headaches.      Objective:     BP 118/62 (BP Location: Left Arm, Patient Position: Sitting, Cuff Size: Normal)   Pulse 79   Temp 98.2 F (36.8 C) (Oral)   Ht '5\' 10"'$  (1.778 m)   Wt 194 lb 6.4 oz (88.2 kg)   SpO2 95%   BMI 27.89 kg/m  BP Readings from Last 3 Encounters:  05/11/22 118/62  01/29/22 (!) 150/60  06/30/21 132/72   Wt Readings from Last 3 Encounters:  05/11/22 194 lb 6.4 oz (88.2 kg)  01/29/22 192 lb (87.1 kg)  06/30/21 192 lb (87.1 kg)      Physical Exam Vitals reviewed.  Constitutional:      Appearance: Normal appearance.  Cardiovascular:     Rate and Rhythm: Normal rate and regular rhythm.  Pulmonary:     Effort: Pulmonary effort is normal.     Breath sounds: Normal breath sounds. No wheezing or rales.  Skin:    Comments: He has several small bruises on both forearms  Neurological:     Mental Status: He is alert.      No results found for any visits  on 05/11/22.    The 10-year ASCVD risk score (Arnett DK, et al., 2019) is: 21.8%    Assessment & Plan:   Problem List Items Addressed This Visit   None Visit Diagnoses     Easy bruising    -  Primary   Relevant Orders   CBC with Differential/Platelet   Arthralgia, unspecified joint       Relevant Orders   Sedimentation rate   Rheumatoid Factor   Cyclic citrul peptide antibody, IgG   ANA     Patient has easy bruising on both forearms.  Given location and the fact that he takes aspirin and budesonide suspect this is related to nonpathologic etiology.  Will check CBC to be safe.  He complains of bilateral hand and foot pain.  Family history of rheumatoid arthritis.  No objective signs of inflammation at this time.  Check for other screening labs with sed rate, rheumatoid factor, ANA, CCP antibody  No follow-ups  on file.    Adam Littler, MD

## 2022-05-13 LAB — ANTI-NUCLEAR AB-TITER (ANA TITER): ANA Titer 1: 1:40 {titer} — ABNORMAL HIGH

## 2022-05-13 LAB — CYCLIC CITRUL PEPTIDE ANTIBODY, IGG: Cyclic Citrullin Peptide Ab: <16 UNITS

## 2022-05-13 LAB — RHEUMATOID FACTOR: Rheumatoid fact SerPl-aCnc: <14 IU/mL (ref <14)

## 2022-05-13 LAB — ANA: Anti Nuclear Antibody (ANA): POSITIVE — AB

## 2022-05-27 ENCOUNTER — Other Ambulatory Visit: Payer: Self-pay | Admitting: Family Medicine

## 2022-05-27 DIAGNOSIS — I10 Essential (primary) hypertension: Secondary | ICD-10-CM

## 2022-05-27 NOTE — Telephone Encounter (Signed)
lisinopril (ZESTRIL) 40 MG tablet

## 2022-05-29 ENCOUNTER — Telehealth: Payer: Self-pay | Admitting: Internal Medicine

## 2022-05-29 ENCOUNTER — Other Ambulatory Visit: Payer: Self-pay

## 2022-05-29 MED ORDER — BUDESONIDE 3 MG PO CPEP: 9.0000 mg | ORAL_CAPSULE | Freq: Every day | ORAL | 0 refills | Status: DC

## 2022-05-29 NOTE — Telephone Encounter (Signed)
Patient called in with complaints of poor appetite, nausea & diarrhea (2-3/day) for the last 3 weeks. Denies any bleeding. He was last seen in [place] on 01/29/22 with Dr. Henrene Pastor & was advised to taper off budesonide. He is currently no longer taking it. He is seeking further MD advice on whether or not he needs to start back on the budesonide, and if there are any other alternatives. He has tried imodium in the past, but it only provides temporary relief. Will route to MD.

## 2022-05-29 NOTE — Telephone Encounter (Signed)
Pt is calling saying he is having a flare up of a reoccurring problem. Requesting a call back for further advice. Thank you

## 2022-05-29 NOTE — Telephone Encounter (Signed)
Spoke with patient regarding MD recommendations. Prescription sent to pharmacy. Patient is aware that Dr. Blanch Media nurse will call back on Tuesday to schedule follow up. Pt verbalized all understanding.

## 2022-05-29 NOTE — Telephone Encounter (Signed)
Left message for patient to call back  

## 2022-05-29 NOTE — Telephone Encounter (Signed)
Patient is returning your call.  

## 2022-06-02 ENCOUNTER — Ambulatory Visit
Admission: EM | Admit: 2022-06-02 | Discharge: 2022-06-02 | Disposition: A | Discharge status: Home / Self Care | Payer: Medicare Other | Attending: Emergency Medicine | Admitting: Emergency Medicine

## 2022-06-02 DIAGNOSIS — B349 Viral infection, unspecified: Secondary | ICD-10-CM | POA: Diagnosis not present

## 2022-06-02 DIAGNOSIS — U071 COVID-19: Secondary | ICD-10-CM | POA: Insufficient documentation

## 2022-06-02 LAB — RESP PANEL BY RT-PCR (FLU A&B, COVID) ARPGX2
Influenza A by PCR: NEGATIVE
Influenza B by PCR: NEGATIVE
SARS Coronavirus 2 by RT PCR: POSITIVE — AB

## 2022-06-02 NOTE — ED Triage Notes (Signed)
Pt c/o sore throat, congestion, headache and fever that started Saturday.  Home interventions: nsaids

## 2022-06-02 NOTE — Discharge Instructions (Addendum)
Your symptoms and physical exam findings are concerning for a viral respiratory infection.  The result of your viral PCR testing for COVID-19 and influenza will be posted to your MyChart once it is complete, typically this takes 6 to 12 hours.    If either PCR test is positive, you will be contacted by phone.  Please discuss with the callback nurse whether or not you would benefit from antiviral therapy for treatment of COVID-19 called Paxlovid.  Due to the duration of your symptoms, if your flu test is positive you would no longer benefit from the antiviral treatment for influenza called Tamiflu.   If both your COVID-19 and influenza PCR tests are negative, then your illness is likely due to one of the many less serious illnesses that are circulating in our community right now.  Conservative care is recommended.  Please remain at home until you are fever free for 24 hours without the use of antifever medications such as Tylenol and ibuprofen.   Based on my physical exam findings and the history you have provided  today, I do not recommend antibiotics at this time.  I do not believe the risks and side effects of antibiotics would outweigh any minimal benefit that they might provide.       Please see the list below for recommended medications, dosages and frequencies to provide relief of your current symptoms:    Advil, Motrin (ibuprofen): This is a good anti-inflammatory medication which addresses aches, pains and inflammation of the upper airways that causes sinus and nasal congestion as well as in the lower airways which makes your cough feel tight and sometimes burn.  I recommend that you take between 400 to 600 mg every 6-8 hours as needed.      Tylenol (acetaminophen): This is a good fever reducer.  If your body temperature rises above 101.5 as measured with a thermometer, it is recommended that you take 1,000 mg every 8 hours until your temperature falls below 101.5, please not take more than 3,000  mg of acetaminophen either as a separate medication or as in ingredient in an over-the-counter cold/flu preparation within a 24-hour period.      Robitussin, Mucinex (guaifenesin): This is an expectorant.  This helps break up chest congestion and loosen up thick nasal drainage making phlegm and drainage more liquid and therefore easier to remove.  I recommend being 400 mg three times daily as needed.      Chloraseptic Throat Spray: Spray 5 sprays into affected area every 2 hours, hold for 15 seconds and either swallow or spit it out.  This is a excellent numbing medication because it is a spray, you can put it right where you needed and so sucking on a lozenge and numbing your entire mouth.      Please follow-up within the next 5-7 days either with your primary care provider or urgent care if your symptoms do not resolve.  If you do not have a primary care provider, we will assist you in finding one.   Thank you for visiting urgent care today.  We appreciate the opportunity to participate in your care.

## 2022-06-02 NOTE — Telephone Encounter (Signed)
Advance practitioner appointment at the end of September is perfect

## 2022-06-02 NOTE — Telephone Encounter (Signed)
Dr. Henrene Pastor please see note below and advise regarding scheduling. !st available app appt is 9/29.

## 2022-06-02 NOTE — ED Provider Notes (Signed)
[facility]    CSN: [id number] Arrival date & time: 06/02/22  1056    HISTORY   Chief Complaint  Patient presents with   Nasal Congestion   Headache   Fever   Sore Throat   HPI Adam Villanueva is a pleasant, 71 y.o. male who presents to urgent care today. Patient complains of nonproductive cough, sore throat, nasal congestion, headache, chills and subjective fever that began 3 days ago.  Patient states he has been taking NSAIDs which provide temporary relief.  Patient denies known sick contacts, has not been around small children in the last few weeks.  Patient has normal vital signs on arrival at this time.  The history is provided by the patient.   Past Medical History:  Diagnosis Date   Allergy    Anxiety    Arthritis    Cataract    Chronic insomnia    GERD (gastroesophageal reflux disease)    Hyperlipidemia    Hypertension    Lower back pain    and mid back pain   Prostatitis    Sleep apnea    no cpap   Patient Active Problem List   Diagnosis Date Noted   Osteoarthritis, multiple sites 05/26/2013   Chronic insomnia 01/26/2012   Hyperlipidemia 10/03/2010   Essential hypertension 10/03/2010   ESOPHAGITIS, REFLUX 10/03/2010   GERD 10/03/2010   NAUSEA 10/03/2010   DYSPHAGIA UNSPECIFIED 10/03/2010   DYSPHAGIA 10/03/2010   ABDOMINAL PAIN, LEFT UPPER QUADRANT 10/03/2010   ABDOMINAL PAIN-MULTIPLE SITES 10/03/2010   Past Surgical History:  Procedure Laterality Date   COLONOSCOPY     POLYPECTOMY     SEPTOPLASTY     broken nose   WISDOM TOOTH EXTRACTION      Home Medications    Prior to Admission medications   Medication Sig Start Date End Date Taking? Authorizing Provider  atorvastatin (LIPITOR) 20 MG tablet TAKE 1 TABLET BY MOUTH DAILY 03/23/22   Burchette, Alinda Sierras, MD  budesonide (ENTOCORT EC) 3 MG 24 hr capsule Take 3 capsules (9 mg total) by mouth daily. 05/29/22   Thornton Park, MD  cholecalciferol (VITAMIN D) 1000 units tablet Take 1,000  Units by mouth daily.    [provider]  esomeprazole (NEXIUM) 20 MG capsule 20 mg. Uses only PRN for flare ups.    [provider]  ibuprofen (ADVIL,MOTRIN) 200 MG tablet Take 400 mg by mouth every 6 (six) hours as needed (pain).    [provider]  lisinopril (ZESTRIL) 40 MG tablet TAKE 1 TABLET BY MOUTH  DAILY 05/28/22   Burchette, Alinda Sierras, MD  metoprolol succinate (TOPROL-XL) 50 MG 24 hr tablet TAKE 1 TABLET BY MOUTH  DAILY WITH OR IMMEDIATELY  FOLLOWING A MEAL 04/28/22   Burchette, Alinda Sierras, MD  Multiple Vitamin (MULTIVITAMIN) tablet Take 1 tablet by mouth daily.    [provider]  Omega-3 Fatty Acids (OMEGA-3 FISH OIL PO) Take 2,400 mg by mouth in the morning and at bedtime.    [provider]  pantoprazole (PROTONIX) 40 MG tablet TAKE 1 TABLET BY MOUTH DAILY 02/02/22   Burchette, Alinda Sierras, MD  tamsulosin (FLOMAX) 0.4 MG CAPS capsule Take 0.4 mg by mouth.    [provider]  Turmeric 400 MG CAPS Take by mouth as needed.    [provider]  vitamin C (ASCORBIC ACID) 500 MG tablet Take 500 mg by mouth daily.    [provider]    Family History Family History  Problem Relation Age of Onset   Arthritis Mother        rheumatoid   Hyperlipidemia Mother    Hypertension Mother    Rectal cancer Mother        20 years   Arthritis Father    Hypertension Father    Alzheimer's disease Father    Throat cancer Brother    Arthritis Maternal Grandmother    Arthritis Maternal Grandfather    Arthritis Paternal Grandmother    Arthritis Paternal Grandfather    Colon polyps Neg Hx    Esophageal cancer Neg Hx    Stomach cancer Neg Hx    Social History Social History   Tobacco Use   Smoking status: Never   Smokeless tobacco: Never  Vaping Use   Vaping Use: Never used  Substance Use Topics   Alcohol use: Yes    Alcohol/week: 0.0 standard drinks of alcohol    Comment: occ beer   Drug use: No   Allergies   Patient has no  known allergies.  Review of Systems Review of Systems Pertinent findings revealed after performing a 14 point review of systems has been noted in the history of present illness.  Physical Exam Triage Vital Signs ED Triage Vitals  Enc Vitals Group     BP 07/25/21 0827 (!) 147/82     Pulse Rate 07/25/21 0827 72     Resp 07/25/21 0827 18     Temp 07/25/21 0827 98.3 F (36.8 C)     Temp Source 07/25/21 0827 Oral     SpO2 07/25/21 0827 98 %     Weight --      Height --      Head Circumference --      Peak Flow --      Pain Score 07/25/21 0826 5     Pain Loc --      Pain Edu? --      Excl. in [place]? --   No data found.  Updated Vital Signs BP 129/83 (BP Location: Right Arm)   Pulse 92   Temp 98.3 F (36.8 C) (Oral)   Resp 16   SpO2 96%   Physical Exam Constitutional:      Appearance: He is ill-appearing.  HENT:     Head: Normocephalic and atraumatic.     Salivary Glands: Right salivary gland is not diffusely enlarged or tender. Left salivary gland is not diffusely enlarged or tender.     Right Ear: Tympanic membrane, ear canal and external ear normal.     Left Ear: Tympanic membrane, ear canal and external ear normal.     Nose: Congestion and rhinorrhea present. Rhinorrhea is clear.     Right Sinus: No maxillary sinus tenderness or frontal sinus tenderness.     Left Sinus: No maxillary sinus tenderness.     Mouth/Throat:     Mouth: Mucous membranes are moist.     Pharynx: Pharyngeal swelling, posterior oropharyngeal erythema and uvula swelling present.     Tonsils: No tonsillar exudate. 0 on the right. 0 on the left.  Cardiovascular:     Rate and Rhythm: Normal rate and regular rhythm.     Pulses: Normal pulses.     Heart sounds: Normal heart sounds.  Pulmonary:     Effort: Pulmonary effort is normal. No accessory muscle usage, prolonged expiration or respiratory distress.     Breath sounds: No stridor. No wheezing, rhonchi or rales.     Comments: Course breath sounds  throughout without  wheeze, rale, rhonchi. Abdominal:     General: Abdomen is flat. Bowel sounds are normal.     Palpations: Abdomen is soft.  Musculoskeletal:        General: Normal range of motion.     Cervical back: Normal range of motion and neck supple.  Lymphadenopathy:     Cervical: Cervical adenopathy present.     Right cervical: Superficial cervical adenopathy and posterior cervical adenopathy present.     Left cervical: Superficial cervical adenopathy and posterior cervical adenopathy present.  Skin:    General: Skin is warm and dry.  Neurological:     General: No focal deficit present.     Mental Status: He is alert and oriented to person, place, and time.     Motor: Motor function is intact.     Coordination: Coordination is intact.     Gait: Gait is intact.     Deep Tendon Reflexes: Reflexes are normal and symmetric.  Psychiatric:        Attention and Perception: Attention and perception normal.        Mood and Affect: Mood and affect normal.        Speech: Speech normal.        Behavior: Behavior normal. Behavior is cooperative.        Thought Content: Thought content normal.     Visual Acuity Right Eye Distance:   Left Eye Distance:   Bilateral Distance:    Right Eye Near:   Left Eye Near:    Bilateral Near:     UC Couse / Diagnostics / Procedures:     Radiology No results found.  Procedures Procedures (including critical care time) EKG  Pending results:  Labs Reviewed  RESP PANEL BY RT-PCR (FLU A&B, COVID) [id number]    Medications Ordered in UC: Medications - No data to display  UC Diagnoses / Final Clinical Impressions(s)   I have reviewed the triage vital signs and the nursing notes.  Pertinent labs & imaging results that were available during my care of the patient were reviewed by me and considered in my medical decision making (see chart for details).    Final diagnoses:  Viral illness   Viral PCR testing is pending.  Patient is outside  the window of benefit for Tamiflu.  Due to his age, patient should be prescribed Paxlovid if he test positive for COVID-19.  Last GFR was 74 on April 28, 2021.  ED Prescriptions   None    PDMP not reviewed this encounter.  Disposition Upon Discharge:  Condition: stable for discharge home Home: take medications as prescribed; routine discharge instructions as discussed; follow up as advised.  Patient presented with an acute illness with associated systemic symptoms and significant discomfort requiring urgent management. In my opinion, this is a condition that a prudent lay person (someone who possesses an average knowledge of health and medicine) may potentially expect to result in complications if not addressed urgently such as respiratory distress, impairment of bodily function or dysfunction of bodily organs.   Routine symptom specific, illness specific and/or disease specific instructions were discussed with the patient and/or caregiver at length.   As such, the patient has been evaluated and assessed, work-up was performed and treatment was provided in alignment with urgent care protocols and evidence based medicine.  Patient/parent/caregiver has been advised that the patient may require follow up for further testing and treatment if the symptoms continue in spite of treatment, as clinically indicated and appropriate.  If the  patient was tested for COVID-19, Influenza and/or RSV, then the patient/parent/guardian was advised to isolate at home pending the results of his/her diagnostic coronavirus test and potentially longer if they're positive. I have also advised pt that if his/her COVID-19 test returns positive, it's recommended to self-isolate for at least 10 days after symptoms first appeared AND until fever-free for 24 hours without fever reducer AND other symptoms have improved or resolved. Discussed self-isolation recommendations as well as instructions for household member/close contacts  as per the [facility] and [place] DHHS, and also gave patient the [place] packet with this information.  Patient/parent/caregiver has been advised to return to the [facility] or PCP in 3-5 days if no better; to PCP or the Emergency Department if new signs and symptoms develop, or if the current signs or symptoms continue to change or worsen for further workup, evaluation and treatment as clinically indicated and appropriate  The patient will follow up with their current PCP if and as advised. If the patient does not currently have a PCP we will assist them in obtaining one.   The patient may need specialty follow up if the symptoms continue, in spite of conservative treatment and management, for further workup, evaluation, consultation and treatment as clinically indicated and appropriate.  Patient/parent/caregiver verbalized understanding and agreement of plan as discussed.  All questions were addressed during visit.  Please see discharge instructions below for further details of plan.  Discharge Instructions:   Discharge Instructions      Your symptoms and physical exam findings are concerning for a viral respiratory infection.  The result of your viral PCR testing for COVID-19 and influenza will be posted to your MyChart once it is complete, typically this takes 6 to 12 hours.    If either PCR test is positive, you will be contacted by phone.  Please discuss with the callback nurse whether or not you would benefit from antiviral therapy for treatment of COVID-19 called Paxlovid.  Due to the duration of your symptoms, if your flu test is positive you would no longer benefit from the antiviral treatment for influenza called Tamiflu.   If both your COVID-19 and influenza PCR tests are negative, then your illness is likely due to one of the many less serious illnesses that are circulating in our community right now.  Conservative care is recommended.  Please remain at home until you are fever free for 24 hours without  the use of antifever medications such as Tylenol and ibuprofen.   Based on my physical exam findings and the history you have provided  today, I do not recommend antibiotics at this time.  I do not believe the risks and side effects of antibiotics would outweigh any minimal benefit that they might provide.       Please see the list below for recommended medications, dosages and frequencies to provide relief of your current symptoms:    Advil, Motrin (ibuprofen): This is a good anti-inflammatory medication which addresses aches, pains and inflammation of the upper airways that causes sinus and nasal congestion as well as in the lower airways which makes your cough feel tight and sometimes burn.  I recommend that you take between 400 to 600 mg every 6-8 hours as needed.      Tylenol (acetaminophen): This is a good fever reducer.  If your body temperature rises above 101.5 as measured with a thermometer, it is recommended that you take 1,000 mg every 8 hours until your temperature falls below 101.5, please not  take more than 3,000 mg of acetaminophen either as a separate medication or as in ingredient in an over-the-counter cold/flu preparation within a 24-hour period.      Robitussin, Mucinex (guaifenesin): This is an expectorant.  This helps break up chest congestion and loosen up thick nasal drainage making phlegm and drainage more liquid and therefore easier to remove.  I recommend being 400 mg three times daily as needed.      Chloraseptic Throat Spray: Spray 5 sprays into affected area every 2 hours, hold for 15 seconds and either swallow or spit it out.  This is a excellent numbing medication because it is a spray, you can put it right where you needed and so sucking on a lozenge and numbing your entire mouth.      Please follow-up within the next 5-7 days either with your primary care provider or urgent care if your symptoms do not resolve.  If you do not have a primary care provider, we will  assist you in finding one.   Thank you for visiting urgent care today.  We appreciate the opportunity to participate in your care.       This office note has been dictated using Museum/gallery curator.  Unfortunately, this method of dictation can sometimes lead to typographical or grammatical errors.  I apologize for your inconvenience in advance if this occurs.  Please do not hesitate to reach out to me if clarification is needed.      Lynden Oxford Scales, PA-C 06/02/22 1241

## 2022-06-02 NOTE — Telephone Encounter (Signed)
Pt scheduled to see Vicie Mutters PA 06/26/22 at 1:30pm. Appt letter mailed to pt.

## 2022-06-03 ENCOUNTER — Ambulatory Visit: Payer: Medicare Other | Admitting: Family Medicine

## 2022-06-03 ENCOUNTER — Telehealth: Payer: Self-pay | Admitting: Family Medicine

## 2022-06-03 MED ORDER — NIRMATRELVIR/RITONAVIR (PAXLOVID)TABLET
3.0000 | ORAL_TABLET | Freq: Two times a day (BID) | ORAL | 0 refills | Status: AC
Start: 2022-06-03 — End: 2022-06-08

## 2022-06-03 NOTE — Addendum Note (Signed)
Addended by: Nilda Riggs on: 06/03/2022 05:33 PM   Modules accepted: Orders

## 2022-06-03 NOTE — Telephone Encounter (Signed)
Patient has been scheduled with Dr. Maudie Mercury tomorrow due to limited availability in our office today. Patient still requested message be sent back to see if PCP can possible send in medication without a visit.

## 2022-06-03 NOTE — Telephone Encounter (Signed)
Pt tested Positive for Covid yesterday.    Pt would like to know if MD is able to send medication to pharmacy to treat Covid symptoms?  LOV: 05/11/22  Please advise.  [facility] - [address], [place] Phone:  [phone]  Fax:  [phone]

## 2022-06-03 NOTE — Telephone Encounter (Signed)
Patient informed to hold Lipitor while taking Paxlovid

## 2022-06-03 NOTE — Telephone Encounter (Signed)
I reviewed message with PCP and Paxlovid was sent in under direction of PCP for the patient. Patient aware rx was sent

## 2022-06-04 ENCOUNTER — Telehealth: Payer: Medicare Other | Admitting: Family Medicine

## 2022-06-12 ENCOUNTER — Ambulatory Visit
Admission: EM | Admit: 2022-06-12 | Discharge: 2022-06-12 | Disposition: A | Discharge status: Home / Self Care | Payer: Medicare Other | Attending: Internal Medicine | Admitting: Internal Medicine

## 2022-06-12 ENCOUNTER — Ambulatory Visit (INDEPENDENT_AMBULATORY_CARE_PROVIDER_SITE_OTHER): Payer: Medicare Other

## 2022-06-12 DIAGNOSIS — R059 Cough, unspecified: Secondary | ICD-10-CM

## 2022-06-12 DIAGNOSIS — U071 COVID-19: Secondary | ICD-10-CM | POA: Diagnosis not present

## 2022-06-12 NOTE — ED Triage Notes (Signed)
Pt presents with shortness of breath X 1 week, 10 days post testing positive for covid.

## 2022-06-12 NOTE — ED Provider Notes (Signed)
[name]    CSN: [id number] Arrival date & time: 06/12/22  1116      History   Chief Complaint Chief Complaint  Patient presents with   Shortness of Breath    HPI ODAI Adam Villanueva is a 71 y.o. male.   Patient here today for evaluation of shortness of breath that he has had the last week.  He is 10 days post COVID.  He reports that overall symptoms have improved however he went to physical this morning and was instructed to come to urgent care for further evaluation given continued symptoms.  He notes mild shortness of breath on exertion since having COVID.  He denies any chest pain.  He has not had fever.  He does not report treatment for symptoms.  The history is provided by the patient.  Shortness of Breath Associated symptoms: cough   Associated symptoms: no fever and no vomiting     Past Medical History:  Diagnosis Date   Allergy    Anxiety    Arthritis    Cataract    Chronic insomnia    GERD (gastroesophageal reflux disease)    Hyperlipidemia    Hypertension    Lower back pain    and mid back pain   Prostatitis    Sleep apnea    no cpap    Patient Active Problem List   Diagnosis Date Noted   Osteoarthritis, multiple sites 05/26/2013   Chronic insomnia 01/26/2012   Hyperlipidemia 10/03/2010   Essential hypertension 10/03/2010   ESOPHAGITIS, REFLUX 10/03/2010   GERD 10/03/2010   NAUSEA 10/03/2010   DYSPHAGIA UNSPECIFIED 10/03/2010   DYSPHAGIA 10/03/2010   ABDOMINAL PAIN, LEFT UPPER QUADRANT 10/03/2010   ABDOMINAL PAIN-MULTIPLE SITES 10/03/2010    Past Surgical History:  Procedure Laterality Date   COLONOSCOPY     POLYPECTOMY     SEPTOPLASTY     broken nose   WISDOM TOOTH EXTRACTION         Home Medications    Prior to Admission medications   Medication Sig Start Date End Date Taking? Authorizing Provider  atorvastatin (LIPITOR) 20 MG tablet TAKE 1 TABLET BY MOUTH DAILY 03/23/22   Burchette, Alinda Sierras, MD  budesonide (ENTOCORT  EC) 3 MG 24 hr capsule Take 3 capsules (9 mg total) by mouth daily. 05/29/22   Thornton Park, MD  cholecalciferol (VITAMIN D) 1000 units tablet Take 1,000 Units by mouth daily.    [provider]  esomeprazole (NEXIUM) 20 MG capsule 20 mg. Uses only PRN for flare ups.    [provider]  ibuprofen (ADVIL,MOTRIN) 200 MG tablet Take 400 mg by mouth every 6 (six) hours as needed (pain).    [provider]  lisinopril (ZESTRIL) 40 MG tablet TAKE 1 TABLET BY MOUTH  DAILY 05/28/22   Burchette, Alinda Sierras, MD  metoprolol succinate (TOPROL-XL) 50 MG 24 hr tablet TAKE 1 TABLET BY MOUTH  DAILY WITH OR IMMEDIATELY  FOLLOWING A MEAL 04/28/22   Burchette, Alinda Sierras, MD  Multiple Vitamin (MULTIVITAMIN) tablet Take 1 tablet by mouth daily.    [provider]  Omega-3 Fatty Acids (OMEGA-3 FISH OIL PO) Take 2,400 mg by mouth in the morning and at bedtime.    [provider]  pantoprazole (PROTONIX) 40 MG tablet TAKE 1 TABLET BY MOUTH DAILY 02/02/22   Burchette, Alinda Sierras, MD  tamsulosin (FLOMAX) 0.4 MG CAPS capsule Take 0.4 mg by mouth.    [provider]  Turmeric 400 MG CAPS  Take by mouth as needed.    [provider]  vitamin C (ASCORBIC ACID) 500 MG tablet Take 500 mg by mouth daily.    [provider]    Family History Family History  Problem Relation Age of Onset   Arthritis Mother        rheumatoid   Hyperlipidemia Mother    Hypertension Mother    Rectal cancer Mother        19 years   Arthritis Father    Hypertension Father    Alzheimer's disease Father    Throat cancer Brother    Arthritis Maternal Grandmother    Arthritis Maternal Grandfather    Arthritis Paternal Grandmother    Arthritis Paternal Grandfather    Colon polyps Neg Hx    Esophageal cancer Neg Hx    Stomach cancer Neg Hx     Social History Social History   Tobacco Use   Smoking status: Never   Smokeless tobacco: Never  Vaping Use   Vaping Use: Never used   Substance Use Topics   Alcohol use: Yes    Alcohol/week: 0.0 standard drinks of alcohol    Comment: occ beer   Drug use: No     Allergies   Patient has no known allergies.   Review of Systems Review of Systems  Constitutional:  Negative for chills and fever.  HENT:  Negative for congestion and rhinorrhea.   Eyes:  Negative for discharge and redness.  Respiratory:  Positive for cough and shortness of breath.   Gastrointestinal:  Negative for nausea and vomiting.     Physical Exam Triage Vital Signs ED Triage Vitals  Enc Vitals Group     BP 06/12/22 1159 133/79     Pulse Rate 06/12/22 1159 72     Resp 06/12/22 1159 17     Temp 06/12/22 1159 97.6 F (36.4 C)     Temp Source 06/12/22 1159 Oral     SpO2 06/12/22 1159 98 %     Weight --      Height --      Head Circumference --      Peak Flow --      Pain Score 06/12/22 1200 0     Pain Loc --      Pain Edu? --      Excl. in [place]? --    No data found.  Updated Vital Signs BP 133/79 (BP Location: Right Arm)   Pulse 72   Temp 97.6 F (36.4 C) (Oral)   Resp 17   SpO2 98%       Physical Exam Vitals and nursing note reviewed.  Constitutional:      General: He is not in acute distress.    Appearance: He is well-developed. He is not ill-appearing.  HENT:     Head: Normocephalic and atraumatic.  Eyes:     Conjunctiva/sclera: Conjunctivae normal.  Cardiovascular:     Rate and Rhythm: Normal rate and regular rhythm.  Pulmonary:     Effort: Pulmonary effort is normal. No respiratory distress.     Breath sounds: Normal breath sounds. No wheezing, rhonchi or rales.  Neurological:     Mental Status: He is alert.  Psychiatric:        Mood and Affect: Mood normal.        Behavior: Behavior normal.      UC Treatments / Results  Labs (all labs ordered are listed, but only abnormal results are displayed) Labs Reviewed - No  data to display  EKG   Radiology DG Chest 2 View  Result Date: 06/12/2022 CLINICAL  DATA:  Cough.  COVID positive 10 days ago. EXAM: CHEST - 2 VIEW COMPARISON:  None Available. FINDINGS: The heart size and mediastinal contours are within normal limits. Both lungs are clear. The visualized skeletal structures are unremarkable. IMPRESSION: No active cardiopulmonary disease. Electronically Signed   By: Titus Dubin M.D.   On: 06/12/2022 12:01    Procedures Procedures (including critical care time)  Medications Ordered in UC Medications - No data to display  Initial Impression / Assessment and Plan / UC Course  I have reviewed the triage vital signs and the nursing notes.  Pertinent labs & imaging results that were available during my care of the patient were reviewed by me and considered in my medical decision making (see chart for details).    Chest x-ray ordered given known COVID infection.  X-ray without concerning findings.  Recommended continued symptomatic treatment if needed and follow-up if no continued gradual improvement or with any further concerns.  Final Clinical Impressions(s) / UC Diagnoses   Final diagnoses:  [id number]   Discharge Instructions   None    ED Prescriptions   None    PDMP not reviewed this encounter.   Francene Finders, PA-C 06/12/22 1706

## 2022-06-25 NOTE — Progress Notes (Signed)
06/26/2022 Adam Villanueva [id number] 1951/06/06  Referring provider: Eulas Post, MD Primary GI doctor: Dr. Henrene Pastor  ASSESSMENT AND PLAN:   Assessment: 71 y.o. male here for assessment of the following: 1. Lymphocytic colitis   2. Diarrhea, unspecified type   3. Gastroesophageal reflux disease, unspecified whether esophagitis present   4. History of colonic polyps    Diarrhea with history of lymphocytic colitis but also in the setting of COVID Was on 9 mg since the first, has tapered down since to 3 mg daily.  Has not had diarrhea for a month. No AB pain, no fever, chills.  Possible flare was COVID infection, can try to taper off budesonide. Will try to cut back on his PPI to every other day or as needed with the pepcid daily.  Try to cut back on the NSAIDS Try IBGard, avoid milk products Patient due for colonoscopy 12/2022, can consider scheduling sooner.  Can consider bile acid for diarrhea as well, patient does not want to be on budesonide intermittently  Meds ordered this encounter  Medications   famotidine (PEPCID) 40 MG tablet    Sig: Take 1 tablet (40 mg total) by mouth 2 (two) times daily.    Dispense:  60 tablet    Refill:  3    History of Present Illness:  71 y.o. male  with a past medical history of hypertension, hyperlipidemia, GERD with esophagitis and dysphagia, abdominal pain, lymphocytic colitis and others listed below, returns to clinic today for evaluation of lymphocytic colitis.  12/2016 colonoscopy for screening right-sided hyperplastic polyp, had lympocytic colitis, recall 5 years. Due 12/2022 01/29/2022 office visit with Dr. Henrene Pastor for follow-up of thrombosed hemorrhoid lymphocytic colitis 05/29/2022 called with 10-day history of diarrhea, negative C. difficile and stool studies, back on budesonide 9 mg daily, only took for a few days, he is back on one a day only.  06/02/2022 went to the ER, tested positive for COVID.  States the Paxlovid was  hard on his stomach for pain.   States he has been having bruising easily since COVID shot.  He is on budesonide 3 mg once a day at this time.  Has not had any diarrhea for the month of sept per patient.  States diarrhea on and off since the colonoscopy, states no issues prior to the colonoscopy. He is on protonix daily but if he has flare will take nexium instead of pantoprazole.  Will take advil '200mg'$  BID, tumeric.  Denies fever, chills.   He  reports that he has never smoked. He has never used smokeless tobacco. He reports current alcohol use. He reports that he does not use drugs. His family history includes Alzheimer's disease in his father; Arthritis in his father, maternal grandfather, maternal grandmother, mother, paternal grandfather, and paternal grandmother; Hyperlipidemia in his mother; Hypertension in his father and mother; Rectal cancer in his mother; Throat cancer in his brother.   Current Medications:   Current Outpatient Medications (Endocrine & Metabolic):    budesonide (ENTOCORT EC) 3 MG 24 hr capsule, Take 3 capsules (9 mg total) by mouth daily.  Current Outpatient Medications (Cardiovascular):    atorvastatin (LIPITOR) 20 MG tablet, TAKE 1 TABLET BY MOUTH DAILY   lisinopril (ZESTRIL) 40 MG tablet, TAKE 1 TABLET BY MOUTH  DAILY   metoprolol succinate (TOPROL-XL) 50 MG 24 hr tablet, TAKE 1 TABLET BY MOUTH  DAILY WITH OR IMMEDIATELY  FOLLOWING A MEAL   Current Outpatient Medications (Analgesics):    ibuprofen (ADVIL,MOTRIN)  200 MG tablet, Take 400 mg by mouth every 6 (six) hours as needed (pain).   Current Outpatient Medications (Other):    cholecalciferol (VITAMIN D) 1000 units tablet, Take 1,000 Units by mouth daily.   esomeprazole (NEXIUM) 20 MG capsule, 20 mg. Uses only PRN for flare ups.   famotidine (PEPCID) 40 MG tablet, Take 1 tablet (40 mg total) by mouth 2 (two) times daily.   Multiple Vitamin (MULTIVITAMIN) tablet, Take 1 tablet by mouth daily.   Omega-3  Fatty Acids (OMEGA-3 FISH OIL PO), Take 2,400 mg by mouth in the morning and at bedtime.   pantoprazole (PROTONIX) 40 MG tablet, TAKE 1 TABLET BY MOUTH DAILY   tamsulosin (FLOMAX) 0.4 MG CAPS capsule, Take 0.4 mg by mouth.   Turmeric 400 MG CAPS, Take by mouth as needed.   vitamin C (ASCORBIC ACID) 500 MG tablet, Take 500 mg by mouth daily.  Surgical History:  He  has a past surgical history that includes Colonoscopy; Polypectomy; Septoplasty; and Wisdom tooth extraction.  Current Medications, Allergies, Past Medical History, Past Surgical History, Family History and Social History were reviewed in [employer] record.  Physical Exam: BP 132/74   Pulse 87   Ht '5\' 10"'$  (1.778 m)   Wt 187 lb 8 oz (85 kg)   BMI 26.90 kg/m  General:   Pleasant, well developed male in no acute distress Heart : Regular rate and rhythm; no murmurs Pulm: Clear anteriorly; no wheezing Abdomen:  Soft, Obese AB, Active bowel sounds. No tenderness . Without guarding and Without rebound, No organomegaly appreciated. Rectal: Not evaluated Extremities:  without  edema. Neurologic:  Alert and  oriented x4;  No focal deficits.  Psych:  Cooperative. Normal mood and affect.   Vladimir Crofts, PA-C 06/26/22

## 2022-06-26 ENCOUNTER — Encounter: Payer: Self-pay | Admitting: Physician Assistant

## 2022-06-26 ENCOUNTER — Telehealth: Payer: Self-pay | Admitting: Pharmacist

## 2022-06-26 ENCOUNTER — Ambulatory Visit: Payer: Medicare Other | Admitting: Physician Assistant

## 2022-06-26 ENCOUNTER — Other Ambulatory Visit: Payer: Self-pay | Admitting: Family Medicine

## 2022-06-26 VITALS — BP 132/74 | HR 87 | Ht 70.0 in | Wt 187.5 lb

## 2022-06-26 DIAGNOSIS — K52832 Lymphocytic colitis: Secondary | ICD-10-CM

## 2022-06-26 DIAGNOSIS — Z86010 Personal history of colonic polyps: Secondary | ICD-10-CM | POA: Diagnosis not present

## 2022-06-26 DIAGNOSIS — I10 Essential (primary) hypertension: Secondary | ICD-10-CM

## 2022-06-26 DIAGNOSIS — K219 Gastro-esophageal reflux disease without esophagitis: Secondary | ICD-10-CM

## 2022-06-26 DIAGNOSIS — R197 Diarrhea, unspecified: Secondary | ICD-10-CM

## 2022-06-26 MED ORDER — FAMOTIDINE 40 MG PO TABS: 40.0000 mg | ORAL_TABLET | Freq: Two times a day (BID) | ORAL | 3 refills | Status: DC

## 2022-06-26 NOTE — Patient Instructions (Addendum)
You may have POST INFECTIOUS IBS OR IRRITABLE BOWEL After an infection or diverticulitis flare your intestines can spasm or be a little bit more sensitive. Try these things below:  Can do BRAT diet versus low FODMAP- see below Try trial off milk/lactose products.  Add fiber like benefiber or citracel once a day Can do trial of IBGard for AB pain EVERY DAY peppermint oil- Take 1-2 capsules once a day for maintence or twice a day during a flare if any worsening symptoms like blood in stool, weight loss, please call the office or go to the ER.    Can try the pepcid 40 mg twice a day with nexium every other day or as needed. Try to cut back on NSAIDS Close follow up  FODMAP stands for fermentable oligo-, di-, mono-saccharides and polyols (1). These are the scientific terms used to classify groups of carbs that are notorious for triggering digestive symptoms like bloating, gas and stomach pain.

## 2022-06-26 NOTE — Chronic Care Management (AMB) (Signed)
    Chronic Care Management Pharmacy Assistant   Name: Adam Villanueva  MRN: [id number] DOB: 09-25-1951  06/29/2022 APPOINTMENT REMINDER  Adam Villanueva was reminded to have all medications, supplements and any blood glucose and blood pressure readings available for review with Jeni Salles, Pharm. D, at his telephone visit on 06/29/2022 at 11:15.   Care Gaps: AWV - scheduled 07/01/2022 Last BP - 118/62 on 05/11/2022 Covid - overdue Shingrix - overdue Colonoscopy - overdue Flu - due  Star Rating Drug: Atorvastatin '20mg'$  - last filled 03/23/2022 90 DS at [facility] verified with Charlie Lisinopril '40mg'$  - last filled last filled 05/30/2022 90 DS at [facility]  Any gaps in medications fill history? Yes  [place] Pharmacist Assistant [phone]

## 2022-06-26 NOTE — Progress Notes (Signed)
Assessment and plan noted ?

## 2022-06-29 ENCOUNTER — Ambulatory Visit (INDEPENDENT_AMBULATORY_CARE_PROVIDER_SITE_OTHER): Payer: Medicare Other | Admitting: Pharmacist

## 2022-06-29 DIAGNOSIS — I10 Essential (primary) hypertension: Secondary | ICD-10-CM

## 2022-06-29 DIAGNOSIS — M159 Polyosteoarthritis, unspecified: Secondary | ICD-10-CM

## 2022-06-29 NOTE — Patient Instructions (Signed)
Hi Adam Villanueva,  It was great to catch up again! I am glad you are feeling better post COVID.   Don't forget to try the Voltaren gel for your back pain to use less of the ibuprofen that is likely upsetting your stomach.  Please reach out to me if you have any questions or need anything!  Best, Maddie  Jeni Salles, PharmD, [place] Pharmacist [facility] [place]

## 2022-06-29 NOTE — Progress Notes (Signed)
Chronic Care Management Pharmacy Note  06/29/2022 Name:  Adam Villanueva MRN:  [id number] DOB:  January 05, 1951  Summary: LDL is not at goal < 100 TGs > 500 Pt is still taking ibuprofen daily and knows this will impact his stomach   Recommendations/Changes made from today's visit: -Recommended repeat lipid panel and consider addition of Zetia or PCSK9 if LDL > 100 -Recommend switching OTC fish oil to Vascepa if TGs > 500 at recheck -Recommended trial of Voltaren gel for pain to avoid regular use of ibuprofen   Plan: Scheduled CPE Follow up on lipid panel from CPE  [name] who is a primary patient of Burchette, Adam Sierras, MD.  The CCM team was consulted for assistance with disease management and care coordination needs.    Engaged with patient by telephone for follow up visit in response to provider referral for pharmacy case management and/or care coordination services.   Consent to Services:  The patient was given information about Chronic Care Management services, agreed to services, and gave verbal consent prior to initiation of services.  Please see initial visit note for detailed documentation.   Patient Care Team: Adam Post, MD as PCP - General (Family Medicine) Adam Villanueva, [facility] as Pharmacist (Pharmacist)  Recent office visits: 05/11/22 Adam Littler, MD: Patient presented for hand pain and foot pain.   Recent consult visits: 06/26/22 Adam Mutters, PA-C (gastroenterology): Patient presented for abdominal pain. Decreased PPI to every other day or as needed with Pepcid BID. Recommended fiber supplement daily. Recommended cutting back on NSAIDs.   01/29/2022 Adam Shorts MD (GI) - Patient was seen for Lymphocytic colitis and additional issues. Discontinued Nystatin and Sildenafil. Continue your Budesonide taper:  54m for 3 weeks then 366mfor 3 weeks then stop. No follow up noted.    Hospital visits: [facility]22 [facility]   [facility]  Recommended Paxlovid but patient wanted to follow up with PCP.  Objective:  Lab Results  Component Value Date   CREATININE 1.04 04/28/2021   BUN 11 04/28/2021   GFR 74.00 04/28/2021   GFRNONAA 87 (L) 01/29/2014   GFRAA >90 01/29/2014   NA 138 04/28/2021   K 4.2 04/28/2021   CALCIUM 8.9 04/28/2021   CO2 27 04/28/2021   GLUCOSE 92 04/28/2021    Lab Results  Component Value Date/Time   HGBA1C 5.6 01/22/2016 10:38 AM   GFR 74.00 04/28/2021 09:40 AM   GFR 78.36 04/04/2019 10:22 AM    Last diabetic Eye exam: No results found for: "HMDIABEYEEXA"  Last diabetic Foot exam: No results found for: "HMDIABFOOTEX"   Lab Results  Component Value Date   CHOL 237 (H) 10/14/2021   HDL 35.40 (L) 10/14/2021   LDLCALC 114 (H) 03/04/2021   LDLDIRECT 101.0 10/14/2021   TRIG (H) 10/14/2021    565.0 Triglyceride is over 400; calculations on Lipids are invalid.   CHOLHDL 7 10/14/2021       Latest Ref Rng & Units 03/04/2021    8:37 AM 04/26/2020    9:34 AM 04/04/2019   10:22 AM  Hepatic Function  Total Protein 6.0 - 8.3 g/dL 6.5  6.8  6.9   Albumin 3.5 - 5.2 g/dL 4.2   4.4   AST 0 - 37 U/L '16  20  17   ' ALT  0 - 53 U/L '17  22  18   ' Alk Phosphatase 39 - 117 U/L 69   75   Total Bilirubin 0.2 - 1.2 mg/dL 0.6  0.6  0.6   Bilirubin, Direct 0.0 - 0.3 mg/dL 0.1  0.1  0.1     Lab Results  Component Value Date/Time   TSH 1.36 04/28/2021 09:40 AM   TSH 0.87 04/26/2020 09:34 AM       Latest Ref Rng & Units 05/11/2022   11:27 AM 04/28/2021    9:40 AM 04/11/2020    8:42 AM  CBC  WBC 4.0 - 10.5 K/uL 5.8  4.7  4.8   Hemoglobin 13.0 - 17.0 g/dL 16.3  16.7  17.0   Hematocrit 39.0 - 52.0 % 47.3  47.5  48.0   Platelets 150.0 - 400.0 K/uL 195.0  212.0  226.0     No results found for: "VD25OH"  Clinical ASCVD: No  The 10-year  ASCVD risk score (Arnett DK, et al., 2019) is: 26%   Values used to calculate the score:     Age: 75 years     Sex: Male     Is Non-Hispanic African American: No     Diabetic: No     Tobacco smoker: No     Systolic Blood Pressure: 943 mmHg     Is BP treated: Yes     HDL Cholesterol: 35.4 mg/dL     Total Cholesterol: 237 mg/dL       07/02/2021    4:10 PM 06/30/2021   10:02 AM 06/30/2021    9:59 AM  Depression screen PHQ 2/9  Decreased Interest 0 0 0  Down, Depressed, Hopeless 0 0 0  PHQ - 2 Score 0 0 0      Social History   Tobacco Use  Smoking Status Never  Smokeless Tobacco Never   BP Readings from Last 3 Encounters:  06/26/22 132/74  06/12/22 133/79  06/02/22 129/83   Pulse Readings from Last 3 Encounters:  06/26/22 87  06/12/22 72  06/02/22 92   Wt Readings from Last 3 Encounters:  06/26/22 187 lb 8 oz (85 kg)  05/11/22 194 lb 6.4 oz (88.2 kg)  01/29/22 192 lb (87.1 kg)   BMI Readings from Last 3 Encounters:  06/26/22 26.90 kg/m  05/11/22 27.89 kg/m  01/29/22 27.55 kg/m    Assessment/Interventions: Review of patient past medical history, allergies, medications, health status, including review of consultants reports, laboratory and other test data, was performed as part of comprehensive evaluation and provision of chronic care management services.   SDOH:  (Social Determinants of Health) assessments and interventions performed: Yes SDOH Interventions    Flowsheet Row Chronic Care Management from 06/29/2022 in [place] at [place] from 06/30/2021 in [place] at [place] Neck Management from 12/25/2020 in [place] at [place] from 06/27/2020 in [place] at [place] Interventions -- Intervention Not Indicated -- Intervention Not Indicated  Housing Interventions -- Intervention Not Indicated -- Intervention Not Indicated   Transportation Interventions -- Intervention Not Indicated -- Intervention Not Indicated  Depression Interventions/Treatment  -- -- -- PHQ2-9 Score <4 Follow-up Not Indicated  Financial Strain Interventions Intervention Not Indicated Intervention Not Indicated Intervention Not Indicated Intervention Not Indicated  Physical Activity Interventions -- Intervention Not Indicated -- Intervention Not Indicated  Stress Interventions -- Intervention Not Indicated -- Intervention Not Indicated  Social Connections Interventions -- Intervention Not  Indicated -- Intervention Not Indicated       SDOH Screenings   Food Insecurity: No Food Insecurity (06/30/2021)  Housing: Low Risk  (06/30/2021)  Transportation Needs: No Transportation Needs (06/30/2021)  Alcohol Screen: Low Risk  (06/30/2021)  Depression (PHQ2-9): Low Risk  (07/02/2021)  Financial Resource Strain: Low Risk  (06/29/2022)  Physical Activity: Sufficiently Active (06/30/2021)  Social Connections: Moderately Integrated (06/30/2021)  Stress: No Stress Concern Present (06/30/2021)  Tobacco Use: Low Risk  (06/26/2022)   CCM Care Plan  No Known Allergies  Medications Reviewed Today     Reviewed by Ruthe Mannan, CMA (Certified Medical Assistant) on 06/26/22 at 1323  Med List Status: <None>   Medication Order Taking? Sig Documenting Provider Last Dose Status Informant  atorvastatin (LIPITOR) 20 MG tablet [id number] Yes TAKE 1 TABLET BY MOUTH DAILY Burchette, Adam Sierras, MD Taking Active   budesonide (ENTOCORT EC) 3 MG 24 hr capsule [id number] Yes Take 3 capsules (9 mg total) by mouth daily. [place], MD Taking Active   cholecalciferol (VITAMIN D) 1000 units tablet [id number] Yes Take 1,000 Units by mouth daily. [provider] Taking Active   esomeprazole (NEXIUM) 20 MG capsule [id number] Yes 20 mg. Uses only PRN for flare ups. [provider] Taking Active   ibuprofen (ADVIL,MOTRIN) 200 MG tablet [id number] Yes Take 400 mg  by mouth every 6 (six) hours as needed (pain). [provider] Taking Active Self  lisinopril (ZESTRIL) 40 MG tablet [id number] Yes TAKE 1 TABLET BY MOUTH  DAILY Burchette, Adam Sierras, MD Taking Active   metoprolol succinate (TOPROL-XL) 50 MG 24 hr tablet [id number] Yes TAKE 1 TABLET BY MOUTH  DAILY WITH OR IMMEDIATELY  FOLLOWING A MEAL Burchette, Adam Sierras, MD Taking Active   Multiple Vitamin (MULTIVITAMIN) tablet [id number] Yes Take 1 tablet by mouth daily. [provider] Taking Active   Omega-3 Fatty Acids (OMEGA-3 FISH OIL PO) [id number] Yes Take 2,400 mg by mouth in the morning and at bedtime. [provider] Taking Active   pantoprazole (PROTONIX) 40 MG tablet [id number] Yes TAKE 1 TABLET BY MOUTH DAILY Burchette, Adam Sierras, MD Taking Active   tamsulosin (FLOMAX) 0.4 MG CAPS capsule [id number] Yes Take 0.4 mg by mouth. [provider] Taking Active   Turmeric 400 MG CAPS [id number] Yes Take by mouth as needed. [provider] Taking Active   vitamin C (ASCORBIC ACID) 500 MG tablet [id number] Yes Take 500 mg by mouth daily. [provider] Taking Active             Patient Active Problem List   Diagnosis Date Noted   Osteoarthritis, multiple sites 05/26/2013   Chronic insomnia 01/26/2012   Hyperlipidemia 10/03/2010   Essential hypertension 10/03/2010   ESOPHAGITIS, REFLUX 10/03/2010   GERD 10/03/2010   NAUSEA 10/03/2010   DYSPHAGIA UNSPECIFIED 10/03/2010   DYSPHAGIA 10/03/2010   ABDOMINAL PAIN, LEFT UPPER QUADRANT 10/03/2010   ABDOMINAL PAIN-MULTIPLE SITES 10/03/2010    Immunization History  Administered Date(s) Administered   Fluad Quad(high Dose 65+) 06/22/2019, 06/30/2021   Influenza Split 08/01/2012   Influenza,inj,Quad PF,6+ Mos 05/26/2013, 06/19/2014, 07/04/2015, 07/15/2016, 07/05/2017, 07/29/2018   PFIZER(Purple Top)SARS-COV-2 Vaccination 11/21/2019, 12/12/2019, 07/26/2020   Pfizer Covid-19 Vaccine Bivalent Booster 62yr & up  07/14/2021   Pneumococcal Conjugate-13 04/04/2019   Pneumococcal Polysaccharide-23 04/26/2020   Tdap 02/23/2013   Zoster Recombinat (Shingrix) 10/06/2021   Zoster, Live 02/28/2014    Patient is still taking the ibuprofen a couple times a day. Patient was doing yoga pre-COVID  and not going anymore but this was helping with his back pain. Patient was going regularly for an hour a week. He has used Aspercreme and willow balm in the past. Recommended Voltaren gel for pain.  Patient is getting back to normal from [place] and he has had headaches and soreness in his chest. He denies having a stabbing pain in his chest. He reports breathing is better. He is going to get a pulse oximeter to check this more regularly at home as it was 94 in the gastroenterologist's office.  Patient has restarted atorvastatin since Paxlovid. He was taking it daily up until COVID episode. He thinks he is having back pain with it and is taking it every other day. Patient started back on atorvastatin almost 2 weeks ago.  Conditions to be addressed/monitored:  Hypertension, Hyperlipidemia, GERD, Osteoarthritis, BPH and insomnia  Conditions addressed this visit: Hypertension, hyperlipidemia  Care Plan : CCM Pharmacy Care Plan  Updates made by Adam Villanueva, [place] since 06/29/2022 12:00 AM     Problem: Problem: Hypertension, Hyperlipidemia, GERD, Osteoarthritis, BPH and insomnia      Long-Range Goal: Patient-Specific Goal   Start Date: 12/25/2020  Expected End Date: 12/25/2021  Recent Progress: On track  Priority: High  Note:   Current Barriers:  Unable to independently monitor therapeutic efficacy Unable to achieve control of cholesterol   Pharmacist Clinical Goal(s):  Patient will achieve adherence to monitoring guidelines and medication adherence to achieve therapeutic efficacy achieve control of cholesterol as evidenced by next lipid panel  through collaboration with PharmD and provider.    Interventions: 1:1 collaboration with Adam Post, MD regarding development and update of comprehensive plan of care as evidenced by provider attestation and co-signature Inter-disciplinary care team collaboration (see longitudinal plan of care) Comprehensive medication review performed; medication list updated in electronic medical record  Hypertension (BP goal <140/90) -Controlled -Current treatment: Lisinopril 40 mg 1 tablet daily - Appropriate, Effective, Safe, Accessible Metoprolol succinate 50 mg 1 tablet daily - Appropriate, Effective, Safe, Accessible -Medications previously tried: n/a  -Current home readings: < 130/80 (checking once a week) -Current dietary habits: using salt more than he used to; recommended lower sodium salt -Current exercise habits: walking 3 days a week -Reports hypotensive/hypertensive symptoms -Educated on Daily salt intake goal < 2300 mg; Exercise goal of 150 minutes per week; Importance of home blood pressure monitoring; Proper BP monitoring technique; -Counseled to monitor BP at home weekly, document, and provide log at future appointments -Counseled on diet and exercise extensively Recommended to continue current medication  Hyperlipidemia: (LDL goal < 100) -Uncontrolled -Current treatment: Fish oil daily 1000 mg 2 capsules twice daily - Appropriate, Query effective, Safe, Accessible Atorvastatin 20 mg 1 tablet daily (taking every other day) -Appropriate, Query effective, Safe, Accessible -Medications previously tried: rosuvastatin (back pain), simvastatin  -Current dietary patterns: eats more fiber in the winter; uses olive oil for frying and sometimes uses butter; fries food 3-4 times a week; recommended use of an air fryer -Current exercise habits: walking 3 days a week for 2-2.5 miles -Educated on Cholesterol goals;  Benefits of statin for ASCVD risk reduction; Importance of limiting foods high in cholesterol; Exercise goal of 150  minutes per week; -Counseled on diet and exercise extensively Recommended to continue current medication Recommended repeat lipid panel and consider replacing fish oil with  Vascepa if TGs are elevated.  GERD (Goal: minimize symptoms) -Uncontrolled -Current treatment  Esomeprazole 20 mg 1 capsule as needed - Query Appropriate, Effective, Safe,  Accessible Pantoprazole 40 mg 1 capsule daily - Appropriate, Query effective, Safe, Accessible Famotidine 20 mg 1 capsule twice daily (taking once daily) - Appropriate, Query effective, Safe, Accessible  -Medications previously tried: none -Counseled on not using esomeprazole and pantoprazole on the same days Educated on non-pharmacologic management of symptoms such as elevating the head of your bed, avoiding eating 2-3 hours before bed, avoiding triggering foods such as acidic, spicy, or fatty foods, eating smaller meals, and wearing clothes that are loose around the waist Recommended taking pantoprazole every other day and Pepcid twice daily as recommended by gastroenterologist.  BPH (Goal: minimize symptoms) -Controlled -Current treatment  tamsulosin 0.4 mg 1 capsule daily as needed - Appropriate, Query effective, Safe, Accessible -Medications previously tried: none  -Counseled on taking after a meal to prevent BP dropping.  Osteoarthritis (Goal: minimize pain) -Not ideally controlled -Current treatment  Ibuprofen 200 mg as needed - Appropriate, Effective, Safe, Accessible Aspirin every once in a while (BC powder) - Appropriate, Effective, Safe, Accessible Tylenol 500 mg as needed -Medications previously tried: n/a  -Recommended diclofenac (voltaren gel) for joint pain  Health Maintenance -Vaccine gaps: shingrix (2nd dose) -Current therapy:  Vitamin D and calcium 1000 units 1 tablet daily Multivitamin 1 tablet daily - not every day Turmeric 400 mg 1 capsule as needed Vitamin C 500 mg 1 tablet daily Supplements: Livaplex, albaplex,  betafood, cataplex, symplex, cyruta -Educated on Cost vs benefit of each product must be carefully weighed by individual consumer -Patient is satisfied with current therapy and denies issues -Recommended to continue current medication  Patient Goals/Self-Care Activities Patient will:  - take medications as prescribed check blood pressure weekly, document, and provide at future appointments target a minimum of 150 minutes of moderate intensity exercise weekly engage in dietary modifications by adding more fiber to his diet to lower cholesterol  Follow Up Plan: The care management team will reach out to the patient again over the next 30 days.         Medication Assistance: None required.  Patient affirms current coverage meets needs.  Compliance/Adherence/Medication fill history: Care Gaps: Shingrix, COVID booster, colonoscopy, influenza vaccine BP- 118/62 on 05/11/2022  Star-Rating Drugs: Atorvastatin 28m - last filled [facility]/23/2022 [facility] Lisinopril 434m- last filled last [facility]2022 [facility]  [facility] (MAFordELKey Vista[place]5Ada75[address]93-[address]hone: [phone]: [phone] RuizNCSan BrunodFries8[place]coC[place]aska[address]852-[address]hone: [phone]: [phone] (OpLong ViewCAFair OaksoMilfordaEsterbrookoSavonburguite [address] CaMonument29[address]hone: [phone]: [phone] [id number] [address] Gary[place] [address] LAWinslow WestRIotaCAlaska742683hone: [phone]: [phone] CityKSDunseith8[place] Royal[place] 60Five PointsS [id number]:  [phone]: [phone] Uses pill box? Yes- weekly Pt endorses 99% compliance - once a month  We discussed: Current pharmacy is preferred with insurance plan and patient is satisfied with pharmacy services Patient decided to: Continue current medication management strategy  Care Plan and Follow Up Patient Decision:  Patient agrees to Care Plan and Follow-up.  Plan: The care management team will reach  out to the patient again over the next 30 days.  Jeni Salles, PharmD [facility] Clinical Pharmacist [place] [facility] [place]

## 2022-07-01 ENCOUNTER — Ambulatory Visit (INDEPENDENT_AMBULATORY_CARE_PROVIDER_SITE_OTHER): Payer: Medicare Other

## 2022-07-01 VITALS — BP 120/62 | HR 85 | Temp 98.1°F | Ht 70.0 in | Wt 191.2 lb

## 2022-07-01 DIAGNOSIS — Z23 Encounter for immunization: Secondary | ICD-10-CM

## 2022-07-01 DIAGNOSIS — Z0000 Encounter for general adult medical examination without abnormal findings: Secondary | ICD-10-CM

## 2022-07-01 NOTE — Progress Notes (Signed)
Subjective:   Adam Villanueva is a 71 y.o. male who presents for Medicare Annual/Subsequent preventive examination.  Review of Systems      Cardiac Risk Factors include: advanced age (>41mn, >>73women);hypertension;male gender     Objective:    Today's Vitals   07/01/22 1116  BP: 120/62  Pulse: 85  Temp: 98.1 F (36.7 C)  TempSrc: Oral  SpO2: 96%  Weight: 191 lb 3.2 oz (86.7 kg)  Height: '5\' 10"'$  (1.778 m)   Body mass index is 27.43 kg/m.     07/01/2022   11:27 AM 06/30/2021   10:01 AM 06/27/2020   10:50 AM 01/04/2017   10:40 AM 12/23/2016   12:47 PM  Advanced Directives  Does Patient Have a Medical Advance Directive? Yes Yes Yes Yes Yes  Type of AParamedicof ARaoulLiving will HColeLiving will HTappahann[place]Living will H[place] CanalLiving will HAlineLiving will  Does patient want to make changes to medical advance directive?   No - Patient declined    Copy of [place] Chart? No - copy requested No - copy requested No - copy requested No - copy requested     Current Medications (verified) Outpatient Encounter Medications as of 07/01/2022  Medication Sig   atorvastatin (LIPITOR) 20 MG tablet TAKE 1 TABLET BY MOUTH DAILY   budesonide (ENTOCORT EC) 3 MG 24 hr capsule Take 3 capsules (9 mg total) by mouth daily.   cholecalciferol (VITAMIN D) 1000 units tablet Take 1,000 Units by mouth daily.   esomeprazole (NEXIUM) 20 MG capsule 20 mg. Uses only PRN for flare ups.   famotidine (PEPCID) 40 MG tablet Take 1 tablet (40 mg total) by mouth 2 (two) times daily. (Patient taking differently: Take 40 mg by mouth daily.)   ibuprofen (ADVIL,MOTRIN) 200 MG tablet Take 400 mg by mouth every 6 (six) hours as needed (pain).   lisinopril (ZESTRIL) 40 MG tablet TAKE 1 TABLET BY MOUTH  DAILY   metoprolol succinate (TOPROL-XL) 50 MG 24 hr tablet TAKE 1 TABLET BY MOUTH  DAILY  WITH OR IMMEDIATELY FOLLOWING A  MEAL   Multiple Vitamin (MULTIVITAMIN) tablet Take 1 tablet by mouth daily.   Omega-3 Fatty Acids (OMEGA-3 FISH OIL PO) Take 2,400 mg by mouth in the morning and at bedtime.   pantoprazole (PROTONIX) 40 MG tablet TAKE 1 TABLET BY MOUTH DAILY (Patient taking differently: Take 40 mg by mouth daily.)   polycarbophil (FIBERCON) 625 MG tablet Take 625 mg by mouth daily.   tamsulosin (FLOMAX) 0.4 MG CAPS capsule Take 0.4 mg by mouth.   Turmeric 400 MG CAPS Take by mouth as needed.   vitamin C (ASCORBIC ACID) 500 MG tablet Take 500 mg by mouth daily.   No facility-administered encounter medications on file as of 07/01/2022.    Allergies (verified) Patient has no known allergies.   History: Past Medical History:  Diagnosis Date   Allergy    Anxiety    Arthritis    Cataract    Chronic insomnia    GERD (gastroesophageal reflux disease)    Hyperlipidemia    Hypertension    Lower back pain    and mid back pain   Prostatitis    Sleep apnea    no cpap   Past Surgical History:  Procedure Laterality Date   COLONOSCOPY     POLYPECTOMY     SEPTOPLASTY     broken nose  WISDOM TOOTH EXTRACTION     Family History  Problem Relation Age of Onset   Arthritis Mother        rheumatoid   Hyperlipidemia Mother    Hypertension Mother    Rectal cancer Mother        66 years   Arthritis Father    Hypertension Father    Alzheimer's disease Father    Throat cancer Brother    Arthritis Maternal Grandmother    Arthritis Maternal Grandfather    Arthritis Paternal Grandmother    Arthritis Paternal Grandfather    Colon polyps Neg Hx    Esophageal cancer Neg Hx    Stomach cancer Neg Hx    Social History   Socioeconomic History   Marital status: Married    Spouse name: Not on file   Number of children: Not on file   Years of education: Not on file   Highest education level: Not on file  Occupational History   Not on file  Tobacco Use   Smoking  status: Never   Smokeless tobacco: Never  Vaping Use   Vaping Use: Never used  Substance and Sexual Activity   Alcohol use: Yes    Alcohol/week: 0.0 standard drinks of alcohol    Comment: occ beer   Drug use: No   Sexual activity: Yes    Partners: Female  Other Topics Concern   Not on file  Social History Narrative   Not on file   Social Determinants of Health   Financial Resource Strain: Low Risk  (07/01/2022)   Overall Financial Resource Strain (CARDIA)    Difficulty of Paying Living Expenses: Not hard at all  Food Insecurity: No Food Insecurity (07/01/2022)   Hunger Vital Sign    Worried About Running Out of Food in the Last Year: Never true    Ran Out of Food in the Last Year: Never true  Transportation Needs: No Transportation Needs (07/01/2022)   PRAPARE - Hydrologist (Medical): No    Lack of Transportation (Non-Medical): No  Physical Activity: Sufficiently Active (07/01/2022)   Exercise Vital Sign    Days of Exercise per Week: 5 days    Minutes of Exercise per Session: 30 min  Stress: No Stress Concern Present (07/01/2022)   [place]    Feeling of Stress : Not at all  Social Connections: [place] (07/01/2022)   Social Connection and Isolation Panel [NHANES]    Frequency of Communication with Friends and Family: More than three times a week    Frequency of Social Gatherings with Friends and Family: More than three times a week    Attends Religious Services: More than 4 times per year    Active Member of [employer] or Organizations: Yes    Attends Music therapist: More than 4 times per year    Marital Status: Married    Tobacco Counseling Counseling given: Not Answered   Clinical Intake:  Pre-visit preparation completed: No  Pain : No/denies pain     BMI - recorded: 27.43 Nutritional Status: BMI 25 -29 Overweight Nutritional Risks:  None Diabetes: No  How often do you need to have someone help you when you read instructions, pamphlets, or other written materials from your doctor or pharmacy?: 1 - Never  Diabetic?  No  Interpreter Needed?: No  Information entered by :: Rolene Arbour LPN   Activities of Daily Living    07/01/2022  11:26 AM 07/02/2021    4:10 PM  In your present state of health, do you have any difficulty performing the following activities:  Hearing? 0 0  Vision? 0 0  Difficulty concentrating or making decisions? 0 0  Walking or climbing stairs? 0 0  Dressing or bathing? 0 0  Doing errands, shopping? 0 0  Preparing Food and eating ? N N  Using the Toilet? N N  In the past six months, have you accidently leaked urine? N N  Do you have problems with loss of bowel control? N N  Managing your Medications? N N  Managing your Finances? N N  Housekeeping or managing your Housekeeping? N N    Patient Care Team: Eulas Post, MD as PCP - General (Family Medicine) Viona Gilmore, [facility] as Pharmacist (Pharmacist)  Indicate any recent Medical Services you may have received from other than [facility] providers in the past year (date may be approximate).     Assessment:   This is a routine wellness examination for Ezell.  Hearing/Vision screen Hearing Screening - Comments:: Denies hearing difficulties   Vision Screening - Comments:: Wears rx glasses - up to date with routine eye exams with  Dr Prudencio Burly  Dietary issues and exercise activities discussed: Current Exercise Habits: Home exercise routine, Type of exercise: walking, Time (Minutes): 30, Frequency (Times/Week): 5, Weekly Exercise (Minutes/Week): 150, Intensity: Moderate, Exercise limited by: None identified   Goals Addressed               This Visit's Progress     No current goals (pt-stated)         Depression Screen    07/01/2022   11:23 AM 07/02/2021    4:10 PM 06/30/2021   10:02 AM 06/30/2021    9:59 AM 06/27/2020    10:52 AM 04/26/2020    8:53 AM 04/04/2019    9:46 AM  PHQ 2/9 Scores  PHQ - 2 Score 0 0 0 0 0 0 1  PHQ- 9 Score     0  1    Fall Risk    07/01/2022   11:26 AM 06/30/2021   10:02 AM 06/27/2020   10:51 AM 04/26/2020    8:53 AM 04/04/2019    9:46 AM  Fall Risk   Falls in the past year? 0 0 0 0 0  Number falls in past yr: 0 0 0 0   Injury with Fall? 0 0 0 0   Risk for fall due to : No Fall Risks      Follow up Falls prevention discussed Falls evaluation completed Falls evaluation completed;Falls prevention discussed Falls evaluation completed     FALL RISK PREVENTION PERTAINING TO THE HOME:  Any stairs in or around the home? Yes  If so, are there any without handrails? No  Home free of loose throw rugs in walkways, pet beds, electrical cords, etc? Yes Adequate lighting in your home to reduce risk of falls? Yes   ASSISTIVE DEVICES UTILIZED TO PREVENT FALLS:  Life alert? No  Use of a cane, walker or w/c? No  Grab bars in the bathroom? Yes  Shower chair or bench in shower? No  Elevated toilet seat or a handicapped toilet? Yes   TIMED UP AND GO:  Was the test performed? Yes .  Length of time to ambulate 10 feet: 10 sec.   Gait steady and fast without use of assistive device  Cognitive Function:        07/01/2022  11:27 AM  6CIT Screen  What Year? 0 points  What month? 0 points  What time? 0 points  Count back from 20 0 points  Months in reverse 0 points  Repeat phrase 0 points  Total Score 0 points    Immunizations Immunization History  Administered Date(s) Administered   Fluad Quad(high Dose 65+) 06/22/2019, 06/30/2021, 07/01/2022   Influenza Split 08/01/2012   Influenza,inj,Quad PF,6+ Mos 05/26/2013, 06/19/2014, 07/04/2015, 07/15/2016, 07/05/2017, 07/29/2018   PFIZER(Purple Top)SARS-COV-2 Vaccination 11/21/2019, 12/12/2019, 07/26/2020   Pfizer Covid-19 Vaccine Bivalent Booster 49yr & up 07/14/2021   Pneumococcal Conjugate-13 04/04/2019   Pneumococcal  Polysaccharide-23 04/26/2020   Tdap 02/23/2013   Zoster Recombinat (Shingrix) 10/06/2021   Zoster, Live 02/28/2014    TDAP status: Up to date  Flu Vaccine status: Completed at today's visit  Pneumococcal vaccine status: Up to date  Covid-19 vaccine status: Completed vaccines  Qualifies for Shingles Vaccine? Yes   Zostavax completed Yes   Shingrix Completed?: Yes  Screening Tests Health Maintenance  Topic Date Due   Zoster Vaccines- Shingrix (2 of 2) 12/01/2021   COVID-19 Vaccine (5 - Pfizer risk series) 07/17/2022 (Originally 09/08/2021)   COLONOSCOPY (Pts [phone] coverage will need to be confirmed)  07/02/2023 (Originally 01/04/2022)   TETANUS/TDAP  02/24/2023   Pneumonia Vaccine 6568Years old  Completed   INFLUENZA VACCINE  Completed   Hepatitis C Screening  Completed   HPV VACCINES  Aged Out    Health Maintenance  Health Maintenance Due  Topic Date Due   Zoster Vaccines- Shingrix (2 of 2) 12/01/2021    Colorectal cancer screening: Referral to GI placed Patient deferred. Pt aware the office will call re: appt.  Lung Cancer Screening: (Low Dose CT Chest recommended if Age 69-80ears, 30 pack-year currently smoking OR have quit w/in 15years.) does not qualify.     Additional Screening:  Hepatitis C Screening: does qualify; Completed 01/22/16  Vision Screening: Recommended annual ophthalmology exams for early detection of glaucoma and other disorders of the eye. Is the patient up to date with their annual eye exam?  Yes  Who is the provider or what is the name of the office in which the patient attends annual eye exams? Dr LyPrudencio Burlyf pt is not established with a provider, would they like to be referred to a provider to establish care? No .   Dental Screening: Recommended annual dental exams for proper oral hygiene  Community Resource Referral / Chronic Care Management:  CRR required this visit?  No   CCM required this visit?  No      Plan:     I  have personally reviewed and noted the following in the patient's chart:   Medical and social history Use of alcohol, tobacco or illicit drugs  Current medications and supplements including opioid prescriptions. Patient is not currently taking opioid prescriptions. Functional ability and status Nutritional status Physical activity Advanced directives List of other physicians Hospitalizations, surgeries, and ER visits in previous 12 months Vitals Screenings to include cognitive, depression, and falls Referrals and appointments  In addition, I have reviewed and discussed with patient certain preventive protocols, quality metrics, and best practice recommendations. A written personalized care plan for preventive services as well as general preventive health recommendations were provided to patient.     BeCriselda PeachesLPN   [id number] Nurse Notes: None

## 2022-07-01 NOTE — Patient Instructions (Addendum)
Adam Villanueva , Thank you for taking time to come for your Medicare Wellness Visit. I appreciate your ongoing commitment to your health goals. Please review the following plan we discussed and let me know if I can assist you in the future.   These are the goals we discussed:  Goals       No current goals (pt-stated)      Patient Stated      I will continue to walk 3x per week for at least 30-40 minutes         This is a list of the screening recommended for you and due dates:  Health Maintenance  Topic Date Due   Zoster (Shingles) Vaccine (2 of 2) 12/01/2021   COVID-19 Vaccine (5 - Pfizer risk series) 07/17/2022*   Colon Cancer Screening  07/02/2023*   Tetanus Vaccine  02/24/2023   Pneumonia Vaccine  Completed   Flu Shot  Completed   Hepatitis C Screening: USPSTF Recommendation to screen - Ages 18-79 yo.  Completed   HPV Vaccine  Aged Out  *Topic was postponed. The date shown is not the original due date.    Advanced directives: Please bring a copy of your health care power of attorney and living will to the office to be added to your chart at your convenience.   Conditions/risks identified: None  Next appointment: Follow up in one year for your annual wellness visit.    Preventive Care 69 Years and Older, Male  Preventive care refers to lifestyle choices and visits with your health care provider that can promote health and wellness. What does preventive care include? A yearly physical exam. This is also called an annual well check. Dental exams once or twice a year. Routine eye exams. Ask your health care provider how often you should have your eyes checked. Personal lifestyle choices, including: Daily care of your teeth and gums. Regular physical activity. Eating a healthy diet. Avoiding tobacco and drug use. Limiting alcohol use. Practicing safe sex. Taking low doses of aspirin every day. Taking vitamin and mineral supplements as recommended by your health care  provider. What happens during an annual well check? The services and screenings done by your health care provider during your annual well check will depend on your age, overall health, lifestyle risk factors, and family history of disease. Counseling  Your health care provider may ask you questions about your: Alcohol use. Tobacco use. Drug use. Emotional well-being. Home and relationship well-being. Sexual activity. Eating habits. History of falls. Memory and ability to understand (cognition). Work and work Statistician. Screening  You may have the following tests or measurements: Height, weight, and BMI. Blood pressure. Lipid and cholesterol levels. These may be checked every 5 years, or more frequently if you are over 69 years old. Skin check. Lung cancer screening. You may have this screening every year starting at age 69 if you have a 30-pack-year history of smoking and currently smoke or have quit within the past 15 years. Fecal occult blood test (FOBT) of the stool. You may have this test every year starting at age 69. Flexible sigmoidoscopy or colonoscopy. You may have a sigmoidoscopy every 5 years or a colonoscopy every 10 years starting at age 69. Prostate cancer screening. Recommendations will vary depending on your family history and other risks. Hepatitis C blood test. Hepatitis B blood test. Sexually transmitted disease (STD) testing. Diabetes screening. This is done by checking your blood sugar (glucose) after you have not eaten for a while (  fasting). You may have this done every 1-3 years. Abdominal aortic aneurysm (AAA) screening. You may need this if you are a current or former smoker. Osteoporosis. You may be screened starting at age 69 if you are at high risk. Talk with your health care provider about your test results, treatment options, and if necessary, the need for more tests. Vaccines  Your health care provider may recommend certain vaccines, such  as: Influenza vaccine. This is recommended every year. Tetanus, diphtheria, and acellular pertussis (Tdap, Td) vaccine. You may need a Td booster every 10 years. Zoster vaccine. You may need this after age 73. Pneumococcal 13-valent conjugate (PCV13) vaccine. One dose is recommended after age 13. Pneumococcal polysaccharide (PPSV23) vaccine. One dose is recommended after age 56. Talk to your health care provider about which screenings and vaccines you need and how often you need them. This information is not intended to replace advice given to you by your health care provider. Make sure you discuss any questions you have with your health care provider. Document Released: 10/11/2015 Document Revised: 06/03/2016 Document Reviewed: 07/16/2015 Elsevier Interactive Patient Education  2017 [place] Prevention in the Home Falls can cause injuries. They can happen to people of all ages. There are many things you can do to make your home safe and to help prevent falls. What can I do on the outside of my home? Regularly fix the edges of walkways and driveways and fix any cracks. Remove anything that might make you trip as you walk through a door, such as a raised step or threshold. Trim any bushes or trees on the path to your home. Use bright outdoor lighting. Clear any walking paths of anything that might make someone trip, such as rocks or tools. Regularly check to see if handrails are loose or broken. Make sure that both sides of any steps have handrails. Any raised decks and porches should have guardrails on the edges. Have any leaves, snow, or ice cleared regularly. Use sand or salt on walking paths during winter. Clean up any spills in your garage right away. This includes oil or grease spills. What can I do in the bathroom? Use night lights. Install grab bars by the toilet and in the tub and shower. Do not use towel bars as grab bars. Use non-skid mats or decals in the tub or  shower. If you need to sit down in the shower, use a plastic, non-slip stool. Keep the floor dry. Clean up any water that spills on the floor as soon as it happens. Remove soap buildup in the tub or shower regularly. Attach bath mats securely with double-sided non-slip rug tape. Do not have throw rugs and other things on the floor that can make you trip. What can I do in the bedroom? Use night lights. Make sure that you have a light by your bed that is easy to reach. Do not use any sheets or blankets that are too big for your bed. They should not hang down onto the floor. Have a firm chair that has side arms. You can use this for support while you get dressed. Do not have throw rugs and other things on the floor that can make you trip. What can I do in the kitchen? Clean up any spills right away. Avoid walking on wet floors. Keep items that you use a lot in easy-to-reach places. If you need to reach something above you, use a strong step stool that has a grab bar.  Keep electrical cords out of the way. Do not use floor polish or wax that makes floors slippery. If you must use wax, use non-skid floor wax. Do not have throw rugs and other things on the floor that can make you trip. What can I do with my stairs? Do not leave any items on the stairs. Make sure that there are handrails on both sides of the stairs and use them. Fix handrails that are broken or loose. Make sure that handrails are as long as the stairways. Check any carpeting to make sure that it is firmly attached to the stairs. Fix any carpet that is loose or worn. Avoid having throw rugs at the top or bottom of the stairs. If you do have throw rugs, attach them to the floor with carpet tape. Make sure that you have a light switch at the top of the stairs and the bottom of the stairs. If you do not have them, ask someone to add them for you. What else can I do to help prevent falls? Wear shoes that: Do not have high heels. Have  rubber bottoms. Are comfortable and fit you well. Are closed at the toe. Do not wear sandals. If you use a stepladder: Make sure that it is fully opened. Do not climb a closed stepladder. Make sure that both sides of the stepladder are locked into place. Ask someone to hold it for you, if possible. Clearly mark and make sure that you can see: Any grab bars or handrails. First and last steps. Where the edge of each step is. Use tools that help you move around (mobility aids) if they are needed. These include: Canes. Walkers. Scooters. Crutches. Turn on the lights when you go into a dark area. Replace any light bulbs as soon as they burn out. Set up your furniture so you have a clear path. Avoid moving your furniture around. If any of your floors are uneven, fix them. If there are any pets around you, be aware of where they are. Review your medicines with your doctor. Some medicines can make you feel dizzy. This can increase your chance of falling. Ask your doctor what other things that you can do to help prevent falls. This information is not intended to replace advice given to you by your health care provider. Make sure you discuss any questions you have with your health care provider. Document Released: 07/11/2009 Document Revised: 02/20/2016 Document Reviewed: 10/19/2014 Elsevier Interactive Patient Education  2017 [employer].

## 2022-07-22 ENCOUNTER — Ambulatory Visit (INDEPENDENT_AMBULATORY_CARE_PROVIDER_SITE_OTHER): Payer: Medicare Other | Admitting: Family Medicine

## 2022-07-22 ENCOUNTER — Encounter: Payer: Self-pay | Admitting: Family Medicine

## 2022-07-22 VITALS — BP 126/80 | HR 75 | Temp 97.4°F | Ht 69.69 in | Wt 188.3 lb

## 2022-07-22 DIAGNOSIS — Z0000 Encounter for general adult medical examination without abnormal findings: Secondary | ICD-10-CM | POA: Diagnosis not present

## 2022-07-22 DIAGNOSIS — E785 Hyperlipidemia, unspecified: Secondary | ICD-10-CM | POA: Diagnosis not present

## 2022-07-22 DIAGNOSIS — I10 Essential (primary) hypertension: Secondary | ICD-10-CM

## 2022-07-22 LAB — BASIC METABOLIC PANEL
BUN: 12 mg/dL (ref 6–23)
CO2: 28 mEq/L (ref 19–32)
Calcium: 9.2 mg/dL (ref 8.4–10.5)
Chloride: 100 mEq/L (ref 96–112)
Creatinine, Ser: 0.96 mg/dL (ref 0.40–1.50)
GFR: 80.76 mL/min (ref >60.00)
Glucose, Bld: 98 mg/dL (ref 70–99)
Potassium: 4.2 mEq/L (ref 3.5–5.1)
Sodium: 136 mEq/L (ref 135–145)

## 2022-07-22 LAB — LIPID PANEL
Cholesterol: 185 mg/dL (ref 0–200)
HDL: 33.1 mg/dL — ABNORMAL LOW (ref >39.00)
NonHDL: 151.89
Total CHOL/HDL Ratio: 6
Triglycerides: 372 mg/dL — ABNORMAL HIGH (ref 0.0–149.0)
VLDL: 74.4 mg/dL — ABNORMAL HIGH (ref 0.0–40.0)

## 2022-07-22 LAB — HEPATIC FUNCTION PANEL
ALT: 23 U/L (ref 0–53)
AST: 24 U/L (ref 0–37)
Albumin: 4.4 g/dL (ref 3.5–5.2)
Alkaline Phosphatase: 67 U/L (ref 39–117)
Bilirubin, Direct: 0.1 mg/dL (ref 0.0–0.3)
Total Bilirubin: 0.5 mg/dL (ref 0.2–1.2)
Total Protein: 7.2 g/dL (ref 6.0–8.3)

## 2022-07-22 LAB — LDL CHOLESTEROL, DIRECT: Direct LDL: 94 mg/dL

## 2022-07-22 MED ORDER — METOPROLOL SUCCINATE ER 50 MG PO TB24: ORAL_TABLET | ORAL | 3 refills | Status: DC

## 2022-07-22 MED ORDER — LISINOPRIL 40 MG PO TABS: 40.0000 mg | ORAL_TABLET | Freq: Every day | ORAL | 3 refills | Status: DC

## 2022-07-22 MED ORDER — PANTOPRAZOLE SODIUM 40 MG PO TBEC: 40.0000 mg | DELAYED_RELEASE_TABLET | Freq: Every day | ORAL | 3 refills | Status: DC

## 2022-07-22 MED ORDER — ATORVASTATIN CALCIUM 20 MG PO TABS: 20.0000 mg | ORAL_TABLET | Freq: Every day | ORAL | 3 refills | Status: DC

## 2022-07-22 NOTE — Progress Notes (Signed)
Established Patient Office Visit  Subjective   Patient ID: Adam Villanueva, male    DOB: [birth year]  Age: 71 y.o. MRN: [id number]  Chief Complaint  Patient presents with   Annual Exam    HPI   Seen today for annual exam.  He has history of hypertension, GERD, osteoarthritis, hyperlipidemia, chronic insomnia.  Generally doing well.  He retired in recent years and spends much of his time volunteering with [employer].  No specific complaints today.  He is followed regularly by urology and they are monitoring his PSA.  Health maintenance reviewed  -Flu vaccine already given -Shingrix vaccine complete -Pneumonia vaccines complete -Prior hepatitis C screen negative -Tetanus due next year -Colonoscopy due 2024  Family history-mother had history of hyperlipidemia, hypertension, rectal cancer, and rheumatoid arthritis.  His father had Alzheimer's disease late 70s.  Brother died of complications of "throat "cancer.  Social history-has been married for 6 years.  [place] from first marriage.  He is retired.  Worked with a Geophysicist/field seismologist center for 43 years.  No history of smoking.  No regular alcohol.  Past Medical History:  Diagnosis Date   Allergy    Anxiety    Arthritis    Cataract    Chronic insomnia    GERD (gastroesophageal reflux disease)    Hyperlipidemia    Hypertension    Lower back pain    and mid back pain   Prostatitis    Sleep apnea    no cpap   Past Surgical History:  Procedure Laterality Date   COLONOSCOPY     POLYPECTOMY     SEPTOPLASTY     broken nose   WISDOM TOOTH EXTRACTION      reports that he has never smoked. He has never used smokeless tobacco. He reports current alcohol use. He reports that he does not use drugs. family history includes Alzheimer's disease in his father; Arthritis in his father, maternal grandfather, maternal grandmother, mother, paternal grandfather, and paternal grandmother; Hyperlipidemia in his mother; Hypertension  in his father and mother; Rectal cancer in his mother; Throat cancer in his brother. No Known Allergies   Review of Systems  Constitutional:  Negative for chills, fever, malaise/fatigue and weight loss.  HENT:  Negative for hearing loss.   Eyes:  Negative for blurred vision and double vision.  Respiratory:  Negative for cough and shortness of breath.   Cardiovascular:  Negative for chest pain, palpitations and leg swelling.  Gastrointestinal:  Negative for abdominal pain, blood in stool, constipation and diarrhea.  Genitourinary:  Negative for dysuria.  Skin:  Negative for rash.  Neurological:  Negative for dizziness, speech change, seizures, loss of consciousness and headaches.  Psychiatric/Behavioral:  Negative for depression.       Objective:     BP 126/80 (BP Location: Left Arm, Patient Position: Sitting, Cuff Size: Large)   Pulse 75   Temp (!) 97.4 F (36.3 C) (Oral)   Ht 5' 9.69" (1.77 m)   Wt 188 lb 4.8 oz (85.4 kg)   SpO2 99%   BMI 27.26 kg/m    Physical Exam Vitals reviewed.  Constitutional:      General: He is not in acute distress.    Appearance: He is well-developed. He is not ill-appearing.  HENT:     Head: Normocephalic and atraumatic.     Ears:     Comments: He has some mild cerumen in both ear canals Eyes:     Conjunctiva/sclera: Conjunctivae normal.  Pupils: Pupils are equal, round, and reactive to light.  Neck:     Thyroid: No thyromegaly.  Cardiovascular:     Rate and Rhythm: Normal rate and regular rhythm.     Heart sounds: Normal heart sounds.  Pulmonary:     Effort: No respiratory distress.     Breath sounds: No wheezing or rales.  Abdominal:     General: Bowel sounds are normal. There is no distension.     Palpations: Abdomen is soft. There is no mass.     Tenderness: There is no abdominal tenderness. There is no guarding or rebound.  Musculoskeletal:     Cervical back: Normal range of motion and neck supple.     Right lower leg: No  edema.     Left lower leg: No edema.  Lymphadenopathy:     Cervical: No cervical adenopathy.  Skin:    Findings: No rash.  Neurological:     Mental Status: He is alert and oriented to person, place, and time.     Cranial Nerves: No cranial nerve deficit.      No results found for any visits on 07/22/22.    The 10-year ASCVD risk score (Arnett DK, et al., 2019) is: 24.2%    Assessment & Plan:   Problem List Items Addressed This Visit       Unprioritized   Essential hypertension   Relevant Medications   atorvastatin (LIPITOR) 20 MG tablet   lisinopril (ZESTRIL) 40 MG tablet   metoprolol succinate (TOPROL-XL) 50 MG 24 hr tablet   pantoprazole (PROTONIX) 40 MG tablet   Other Relevant Orders   Basic metabolic panel   Hyperlipidemia   Relevant Medications   atorvastatin (LIPITOR) 20 MG tablet   lisinopril (ZESTRIL) 40 MG tablet   metoprolol succinate (TOPROL-XL) 50 MG 24 hr tablet   Other Relevant Orders   Lipid panel   Hepatic function panel   Other Visit Diagnoses     Physical exam    -  Primary   Hypertension, unspecified type       Relevant Medications   atorvastatin (LIPITOR) 20 MG tablet   lisinopril (ZESTRIL) 40 MG tablet   metoprolol succinate (TOPROL-XL) 50 MG 24 hr tablet     Patient here for physical exam today.  Generally doing well.  Blood pressure well controlled on lisinopril 40 mg daily and refills given for 1 year.  He has hyperlipidemia treated with atorvastatin and we will set up labs as above with lipid panel and hepatic panel GERD well-controlled on pantoprazole and refills given for 1 year.  -Flu vaccine already given -Other vaccines up-to-date -Reminder to follow-up with GI next year for repeat colonoscopy -He plans to get follow-up PSA through urology.  No follow-ups on file.    Carolann Littler, MD

## 2022-07-23 ENCOUNTER — Telehealth: Payer: Self-pay | Admitting: Family Medicine

## 2022-07-23 NOTE — Telephone Encounter (Signed)
Pt returning call for lab results  

## 2022-07-24 NOTE — Telephone Encounter (Signed)
See result note.  

## 2022-07-28 DIAGNOSIS — N400 Enlarged prostate without lower urinary tract symptoms: Secondary | ICD-10-CM

## 2022-07-28 DIAGNOSIS — M1990 Unspecified osteoarthritis, unspecified site: Secondary | ICD-10-CM | POA: Diagnosis not present

## 2022-07-28 DIAGNOSIS — I10 Essential (primary) hypertension: Secondary | ICD-10-CM

## 2022-07-28 DIAGNOSIS — E785 Hyperlipidemia, unspecified: Secondary | ICD-10-CM

## 2022-08-04 ENCOUNTER — Telehealth: Payer: Self-pay | Admitting: Physician Assistant

## 2022-08-04 NOTE — Telephone Encounter (Signed)
Patient called seeking a refill on Pepcid, states he is having recurring diarrhea. Please advise

## 2022-08-05 ENCOUNTER — Other Ambulatory Visit: Payer: Self-pay

## 2022-08-05 ENCOUNTER — Ambulatory Visit (INDEPENDENT_AMBULATORY_CARE_PROVIDER_SITE_OTHER)
Admission: RE | Admit: 2022-08-05 | Discharge: 2022-08-05 | Disposition: A | Discharge status: Home / Self Care | Payer: Medicare Other | Source: Ambulatory Visit | Attending: Physician Assistant | Admitting: Physician Assistant

## 2022-08-05 ENCOUNTER — Other Ambulatory Visit (INDEPENDENT_AMBULATORY_CARE_PROVIDER_SITE_OTHER): Payer: Medicare Other

## 2022-08-05 DIAGNOSIS — R197 Diarrhea, unspecified: Secondary | ICD-10-CM

## 2022-08-05 LAB — CBC WITH DIFFERENTIAL/PLATELET
Basophils Absolute: 0 10*3/uL (ref 0.0–0.1)
Basophils Relative: 0.6 % (ref 0.0–3.0)
Eosinophils Absolute: 0.1 10*3/uL (ref 0.0–0.7)
Eosinophils Relative: 1.7 % (ref 0.0–5.0)
HCT: 44.5 % (ref 39.0–52.0)
Hemoglobin: 15.5 g/dL (ref 13.0–17.0)
Lymphocytes Relative: 20.5 % (ref 12.0–46.0)
Lymphs Abs: 1.3 10*3/uL (ref 0.7–4.0)
MCHC: 34.8 g/dL (ref 30.0–36.0)
MCV: 91 fl (ref 78.0–100.0)
Monocytes Absolute: 0.5 10*3/uL (ref 0.1–1.0)
Monocytes Relative: 7.9 % (ref 3.0–12.0)
Neutro Abs: 4.4 10*3/uL (ref 1.4–7.7)
Neutrophils Relative %: 69.3 % (ref 43.0–77.0)
Platelets: 209 10*3/uL (ref 150.0–400.0)
RBC: 4.89 Mil/uL (ref 4.22–5.81)
RDW: 13.9 % (ref 11.5–15.5)
WBC: 6.4 10*3/uL (ref 4.0–10.5)

## 2022-08-05 LAB — COMPREHENSIVE METABOLIC PANEL
ALT: 20 U/L (ref 0–53)
AST: 18 U/L (ref 0–37)
Albumin: 4.3 g/dL (ref 3.5–5.2)
Alkaline Phosphatase: 64 U/L (ref 39–117)
BUN: 15 mg/dL (ref 6–23)
CO2: 27 mEq/L (ref 19–32)
Calcium: 8.9 mg/dL (ref 8.4–10.5)
Chloride: 103 mEq/L (ref 96–112)
Creatinine, Ser: 1 mg/dL (ref 0.40–1.50)
GFR: 76.88 mL/min (ref >60.00)
Glucose, Bld: 109 mg/dL — ABNORMAL HIGH (ref 70–99)
Potassium: 3.6 mEq/L (ref 3.5–5.1)
Sodium: 138 mEq/L (ref 135–145)
Total Bilirubin: 0.4 mg/dL (ref 0.2–1.2)
Total Protein: 6.9 g/dL (ref 6.0–8.3)

## 2022-08-05 LAB — SEDIMENTATION RATE: Sed Rate: 15 mm/hr (ref 0–20)

## 2022-08-05 MED ORDER — COLESTIPOL HCL 1 G PO TABS: 1.0000 g | ORAL_TABLET | Freq: Two times a day (BID) | ORAL | 0 refills | Status: DC | PRN

## 2022-08-05 NOTE — Telephone Encounter (Signed)
Spoke with patient regarding PA recommendations. Orders placed. He's been advised on when/where to go for labs/xray. He verbalized all understanding.

## 2022-08-05 NOTE — Telephone Encounter (Signed)
Patient called in with complaints of diarrhea (at least 2/day) that has worsened over the last week or so. Prior to the diarrhea he was having some abd pain & bloating, but then once the diarrhea started those symptoms subsided. He has been off budesonide since his last OV on 06/26/22. He is currently scheduled to see Estill Bamberg, [place] on 08/24/22 & would like to know if he needs to start back on budesonide prior to that appointment.

## 2022-08-06 ENCOUNTER — Other Ambulatory Visit: Payer: Self-pay | Admitting: Family Medicine

## 2022-08-06 DIAGNOSIS — E785 Hyperlipidemia, unspecified: Secondary | ICD-10-CM

## 2022-08-06 DIAGNOSIS — I10 Essential (primary) hypertension: Secondary | ICD-10-CM

## 2022-08-10 ENCOUNTER — Telehealth: Payer: Self-pay

## 2022-08-10 NOTE — Progress Notes (Signed)
  Chronic Care Management   Note  08/10/2022 Name: Adam Villanueva MRN: [id number] DOB: May 15, 1951  Adam Villanueva is a 71 y.o. year old male who is a primary care patient of Burchette, Alinda Sierras, MD. I reached out to Adam Villanueva by phone today in response to a referral sent by [name] PCP.  Adam Villanueva  agreedto scheduling an appointment with the CCM RN Case Manager   Follow up plan: Patient agreed to scheduled appointment with RN Case Manager on 08/17/2022(date/time).   Noreene Larsson, [place], [place]  [address] Direct Dial: [phone] [email or web address]

## 2022-08-17 ENCOUNTER — Ambulatory Visit (INDEPENDENT_AMBULATORY_CARE_PROVIDER_SITE_OTHER): Payer: Medicare Other

## 2022-08-17 DIAGNOSIS — I10 Essential (primary) hypertension: Secondary | ICD-10-CM

## 2022-08-17 DIAGNOSIS — E785 Hyperlipidemia, unspecified: Secondary | ICD-10-CM

## 2022-08-19 NOTE — Progress Notes (Signed)
08/24/2022 JOSHAUA EPPLE [id number] July 20, 1951  Referring provider: Eulas Post, MD Primary GI doctor: Dr. Henrene Pastor  ASSESSMENT AND PLAN:   Lymphocytic colitis with history of diarrhea Off budesonide since 09/29 and has been doing well On colestipol 1 gram once a day and states he has had regular Bm's, doing well.  Will refill for 90 day, if expensive can call back and we will send to place for [place].  If any continuing diarrhea will call and will schedule for colonoscopy, otherwise due 12/2022  History of colonic polyps 12/2016 colonoscopy for screening right-sided hyperplastic polyp, had lympocytic colitis, recall 5 years. Due 12/2022  Gastroesophageal reflux disease, unspecified whether esophagitis present Lifestyle changes discussed, avoid NSAIDS, ETOH Continue pepcid  History of Present Illness:  71 y.o. male  with a past medical history of hypertension, hyperlipidemia, GERD with esophagitis and dysphagia, abdominal pain, lymphocytic colitis and others listed below, returns to clinic today for evaluation of lymphocytic colitis.  12/2016 colonoscopy for screening right-sided hyperplastic polyp, had lympocytic colitis, recall 5 years. Due 12/2022 01/29/2022 office visit with Dr. Henrene Pastor for follow-up of thrombosed hemorrhoid lymphocytic colitis 05/29/2022 called with 10-day history of diarrhea, negative C. difficile and stool studies, back on budesonide 9 mg daily, only took for a few days, he is back on one a day only.  06/02/2022 went to the ER, tested positive for COVID.  States the Paxlovid was hard on his stomach for pain.  06/26/22 OV with myself, had diarrhea in setting of COVID, had tapered off budesonide, cut back on PPI, cut back on NSAIDS DUE for Colonoscopy 12/2022 08/04/2022 called with diarrhea 2  a day with bloating and AB pain, Off budesonide since 06/26/22. Given colestipol and labs showed mild colonic stool burden. No leukocytosis, no anemia, normal  kidney and liver, sed rate unremarkable.  Here for follow up.   He has been on the colestipol once a day. He states for the last 10 days he has had a BM once a day regular stool.  He states he has not been having as much bloating or AB pain.  No blood in the stool.  Denies fever, chills.  No weight loss.   He  reports that he has never smoked. He has never used smokeless tobacco. He reports current alcohol use. He reports that he does not use drugs. His family history includes Alzheimer's disease in his father; Arthritis in his father, maternal grandfather, maternal grandmother, mother, paternal grandfather, and paternal grandmother; Hyperlipidemia in his mother; Hypertension in his father and mother; Rectal cancer in his mother; Throat cancer in his brother.   Current Medications:   Current Outpatient Medications (Endocrine & Metabolic):    budesonide (ENTOCORT EC) 3 MG 24 hr capsule, Take 3 capsules (9 mg total) by mouth daily.  Current Outpatient Medications (Cardiovascular):    atorvastatin (LIPITOR) 20 MG tablet, Take 1 tablet (20 mg total) by mouth daily.   colestipol (COLESTID) 1 g tablet, Take 1 tablet (1 g total) by mouth 2 (two) times daily as needed.   lisinopril (ZESTRIL) 40 MG tablet, Take 1 tablet (40 mg total) by mouth daily.   metoprolol succinate (TOPROL-XL) 50 MG 24 hr tablet, TAKE 1 TABLET BY MOUTH DAILY  WITH OR IMMEDIATELY FOLLOWING A  MEAL   Current Outpatient Medications (Analgesics):    ibuprofen (ADVIL,MOTRIN) 200 MG tablet, Take 400 mg by mouth every 6 (six) hours as needed (pain).   Current Outpatient Medications (Other):    cholecalciferol (VITAMIN  D) 1000 units tablet, Take 1,000 Units by mouth daily.   esomeprazole (NEXIUM) 20 MG capsule, 20 mg. Uses only PRN for flare ups.   famotidine (PEPCID) 40 MG tablet, Take 1 tablet (40 mg total) by mouth 2 (two) times daily. (Patient taking differently: Take 40 mg by mouth daily.)   Multiple Vitamin  (MULTIVITAMIN) tablet, Take 1 tablet by mouth daily.   Omega-3 Fatty Acids (OMEGA-3 FISH OIL PO), Take 2,400 mg by mouth in the morning and at bedtime.   pantoprazole (PROTONIX) 40 MG tablet, Take 1 tablet (40 mg total) by mouth daily.   polycarbophil (FIBERCON) 625 MG tablet, Take 625 mg by mouth daily.   tamsulosin (FLOMAX) 0.4 MG CAPS capsule, Take 0.4 mg by mouth.   Turmeric 400 MG CAPS, Take by mouth as needed.   vitamin C (ASCORBIC ACID) 500 MG tablet, Take 500 mg by mouth daily.  Surgical History:  He  has a past surgical history that includes Colonoscopy; Polypectomy; Septoplasty; and Wisdom tooth extraction.  Current Medications, Allergies, Past Medical History, Past Surgical History, Family History and Social History were reviewed in [employer] record.  Physical Exam: BP (!) 140/68   Pulse 85   Ht '5\' 10"'$  (1.778 m)   Wt 191 lb 2 oz (86.7 kg)   BMI 27.42 kg/m  General:   Pleasant, well developed male in no acute distress Heart : Regular rate and rhythm; no murmurs Pulm: Clear anteriorly; no wheezing Abdomen:  Soft, Obese AB, Active bowel sounds. No tenderness . Without guarding and Without rebound, No organomegaly appreciated. Rectal: Not evaluated Extremities:  without  edema. Neurologic:  Alert and  oriented x4;  No focal deficits.  Psych:  Cooperative. Normal mood and affect.   Vladimir Crofts, PA-C 08/24/22

## 2022-08-23 NOTE — Plan of Care (Signed)
Chronic Care Management Provider Comprehensive Care Plan     Name: Adam Villanueva MRN: [id number] DOB: 1950-11-08  Referral to Chronic Care Management (CCM) services was placed by Provider:  Eulas Post, MD on Date: 08/06/22.  Chronic Condition 1: HTN Provider Assessment and Plan  Hypertension, unspecified type         Relevant Medications    atorvastatin (LIPITOR) 20 MG tablet    lisinopril (ZESTRIL) 40 MG tablet    metoprolol succinate (TOPROL-XL) 50 MG 24 hr tablet     Blood pressure well controlled on lisinopril 40 mg daily and refills given for 1 year.    Expected Outcome/Goals Addressed This Visit (Provider CCM goals/Provider Assessment and plan  Goal: CCM (Hypertension) Expected Outcome: Monitor, Self-Manage and Reduce Symptoms of Hypertension  Symptom Management Condition 1: Take medications as prescribed   Attend all scheduled provider appointments Call pharmacy for medication refills 3-7 days in advance of running out of medications Call provider office for new concerns or questions  Check blood pressure daily Keep a blood pressure log Call doctor for signs and symptoms of high blood pressure Report new symptoms to your doctor Consume whole grains, fruits and vegetables, lean meats and healthy fats Limit salt intake  Continue engaging in activity as tolerated.     Chronic Condition 2: HLD Provider Assessment and Plan  Hyperlipidemia     Relevant Medications    atorvastatin (LIPITOR) 20 MG tablet    lisinopril (ZESTRIL) 40 MG tablet    metoprolol succinate (TOPROL-XL) 50 MG 24 hr tablet    Other Relevant Orders    Lipid panel    Hepatic function panel   He has hyperlipidemia treated with atorvastatin and we will set up labs as above with lipid panel and hepatic panel    Expected Outcome/Goals Addressed This Visit (Provider CCM goals/Provider Assessment and plan  Goal: CCM (Hyperlipidemia) Expected Outcome: Monitor, Self-Manage and Reduce Symptoms of  Hyperlipidemia  Symptom Management Condition 2: Take all medications as prescribed Attend all scheduled provider appointments Call pharmacy for medication refills 3-7 days in advance of running out of medications Call provider office for new concerns or questions  Call doctor with any symptoms you believe are related to your medicine Call doctor when you experience any new symptoms  Problem List Patient Active Problem List   Diagnosis Date Noted   Osteoarthritis, multiple sites 05/26/2013   Chronic insomnia 01/26/2012   Hyperlipidemia 10/03/2010   Essential hypertension 10/03/2010   ESOPHAGITIS, REFLUX 10/03/2010   GERD 10/03/2010   NAUSEA 10/03/2010   DYSPHAGIA UNSPECIFIED 10/03/2010   DYSPHAGIA 10/03/2010   ABDOMINAL PAIN, LEFT UPPER QUADRANT 10/03/2010   ABDOMINAL PAIN-MULTIPLE SITES 10/03/2010    Medication Management  Current Outpatient Medications:    atorvastatin (LIPITOR) 20 MG tablet, Take 1 tablet (20 mg total) by mouth daily., Disp: 90 tablet, Rfl: 3   budesonide (ENTOCORT EC) 3 MG 24 hr capsule, Take 3 capsules (9 mg total) by mouth daily., Disp: 90 capsule, Rfl: 0   cholecalciferol (VITAMIN D) 1000 units tablet, Take 1,000 Units by mouth daily., Disp: , Rfl:    colestipol (COLESTID) 1 g tablet, Take 1 tablet (1 g total) by mouth 2 (two) times daily as needed., Disp: 60 tablet, Rfl: 0   esomeprazole (NEXIUM) 20 MG capsule, 20 mg. Uses only PRN for flare ups., Disp: , Rfl:    famotidine (PEPCID) 40 MG tablet, Take 1 tablet (40 mg total) by mouth 2 (two) times daily. (Patient taking  differently: Take 40 mg by mouth daily.), Disp: 60 tablet, Rfl: 3   ibuprofen (ADVIL,MOTRIN) 200 MG tablet, Take 400 mg by mouth every 6 (six) hours as needed (pain)., Disp: , Rfl:    lisinopril (ZESTRIL) 40 MG tablet, Take 1 tablet (40 mg total) by mouth daily., Disp: 90 tablet, Rfl: 3   metoprolol succinate (TOPROL-XL) 50 MG 24 hr tablet, TAKE 1 TABLET BY MOUTH DAILY  WITH OR IMMEDIATELY  FOLLOWING A  MEAL, Disp: 90 tablet, Rfl: 3   Multiple Vitamin (MULTIVITAMIN) tablet, Take 1 tablet by mouth daily., Disp: , Rfl:    Omega-3 Fatty Acids (OMEGA-3 FISH OIL PO), Take 2,400 mg by mouth in the morning and at bedtime., Disp: , Rfl:    pantoprazole (PROTONIX) 40 MG tablet, Take 1 tablet (40 mg total) by mouth daily., Disp: 90 tablet, Rfl: 3   polycarbophil (FIBERCON) 625 MG tablet, Take 625 mg by mouth daily., Disp: , Rfl:    tamsulosin (FLOMAX) 0.4 MG CAPS capsule, Take 0.4 mg by mouth., Disp: , Rfl:    Turmeric 400 MG CAPS, Take by mouth as needed., Disp: , Rfl:    vitamin C (ASCORBIC ACID) 500 MG tablet, Take 500 mg by mouth daily., Disp: , Rfl:   Cognitive Assessment Identity Confirmed: : Name; DOB Cognitive Status: Normal   Functional Assessment Hearing Difficulty or Deaf: no Wear Glasses or Blind: yes Vision Management: Wears Prescription glasses Concentrating, Remembering or Making Decisions Difficulty (CP): no Difficulty Communicating: no Difficulty Eating/Swallowing: no Walking or Climbing Stairs Difficulty: no Dressing/Bathing Difficulty: no Doing Errands Independently Difficulty (such as shopping) (CP): no Change in Functional Status Since Onset of Current Illness/Injury: no   Caregiver Assessment  Primary Source of Support/Comfort: spouse Name of Support/Comfort Primary Source: Adam Villanueva People in Home: spouse Family Caregiver if Needed: none   Planned Interventions  HTN Reviewed plan for hypertension management. Reviewed medications and indications for use. Reports taking all medications as prescribed. Provided information regarding established blood pressure parameters along with indications for notifying a provider. Advised to monitor BP daily and record readings. Reports monitoring as advised. Reports systolic readings have ranged in the 330'Q reports diastolic readings have ranged in the 70's. Discussed compliance with recommended cardiac prudent  diet. Encouraged to read nutrition labels, monitor sodium intake, and avoid highly processed foods when possible. Discussed complications of uncontrolled blood pressure.  Reviewed s/sx of heart attack, stroke and worsening symptoms that require immediate medical attention.  HLD Provider established cholesterol goals reviewed; Discussed importance of regular laboratory monitoring as prescribed; Reviewed role and benefits of statin for ASCVD risk reduction; Discussed strategies to manage statin-induced myalgias; Reviewed importance of limiting foods high in cholesterol; Reviewed exercise goals. Currently engages in regular activity. Assessed social determinant of health barriers Advised to notify provider if unable to tolerate statin Advised to read nutrition labels, limit cholesterol intake and avoid highly processed foods when possible.   Interaction and coordination with outside resources, practitioners, and providers See CCM Referral  Care Plan: Available in MyChart

## 2022-08-23 NOTE — Patient Instructions (Addendum)
Thank you for allowing the Chronic Care Management team to participate in your care.   Our next outreach is scheduled for 11/23/22 at 11:00.  Please do not hesitate to call if you require assistance prior to our next outreach. Please call the care guide team at [phone] if you need to cancel or reschedule your appointment.    Following is a copy of your full provider care plan:   Goals Addressed             This Visit's Progress    Goal: CCM (Hyperlipidemia) Expected Outcome: Monitor, Self-Manage and Reduce Symptoms of Hyperlipidemia       Current Barriers:  Chronic Disease Management support and education needs related to Hyperlipidemia  Planned Interventions: Provider established cholesterol goals reviewed; Discussed importance of regular laboratory monitoring as prescribed; Reviewed role and benefits of statin for ASCVD risk reduction; Discussed strategies to manage statin-induced myalgias; Reviewed importance of limiting foods high in cholesterol; Reviewed exercise goals. Currently engages in regular activity. Assessed social determinant of health barriers Advised to notify provider if unable to tolerate statin Advised to read nutrition labels, limit cholesterol intake and avoid highly processed foods when possible.   Lab Results  Component Value Date   CHOL 185 07/22/2022   HDL 33.10 (L) 07/22/2022   LDLCALC 114 (H) 03/04/2021   LDLDIRECT 94.0 07/22/2022   TRIG 372.0 (H) 07/22/2022   CHOLHDL 6 07/22/2022     Symptom Management: Take all medications as prescribed Attend all scheduled provider appointments Call pharmacy for medication refills 3-7 days in advance of running out of medications Call provider office for new concerns or questions  Call doctor with any symptoms you believe are related to your medicine Call doctor when you experience any new symptoms  Follow Up Plan:  Will follow up in three months            Goal: CCM (Hypertension) Expected  Outcome: Monitor, Self-Manage and Reduce Symptoms of Hypertension       Current Barriers:  Chronic Disease Management support and education needs related to Hypertension  Planned Interventions: Reviewed plan for hypertension management. Reviewed medications and indications for use. Reports taking all medications as prescribed. Provided information regarding established blood pressure parameters along with indications for notifying a provider. Advised to monitor BP daily and record readings. Reports monitoring as advised. Reports systolic readings have ranged in the 222'L reports diastolic readings have ranged in the 70's. Discussed compliance with recommended cardiac prudent diet. Encouraged to read nutrition labels, monitor sodium intake, and avoid highly processed foods when possible. Discussed complications of uncontrolled blood pressure.  Reviewed s/sx of heart attack, stroke and worsening symptoms that require immediate medical attention.   BP Readings from Last 3 Encounters:  07/22/22 126/80  07/01/22 120/62  06/26/22 132/74    Symptom Management: Take medications as prescribed   Attend all scheduled provider appointments Call pharmacy for medication refills 3-7 days in advance of running out of medications Call provider office for new concerns or questions  Check blood pressure daily Keep a blood pressure log Call doctor for signs and symptoms of high blood pressure Report new symptoms to your doctor Consume whole grains, fruits and vegetables, lean meats and healthy fats Limit salt intake  Continue engaging in activity as tolerated.  Follow Up Plan:  Will follow up in three months          Adam Villanueva understanding of instructions and care plan provided today and agrees to view in [place].  A member of the care management team will follow up with Adam Villanueva in three months.     Horris Latino RN Care Manager/Chronic Care Management [phone]

## 2022-08-23 NOTE — Chronic Care Management (AMB) (Signed)
Chronic Care Management   CCM RN Visit Note   Name: Adam Villanueva MRN: [id number] DOB: 11/02/1950  Subjective: Adam Villanueva is a 71 y.o. year old male who is a primary care patient of Burchette, Alinda Sierras, MD. The patient was referred to the Chronic Care Management team for assistance with care management needs subsequent to provider initiation of CCM services and plan of care.    Today's Visit:  Engaged with patient by telephone for initial visit.     SDOH Interventions Today    Flowsheet Row Most Recent Value  SDOH Interventions   Food Insecurity Interventions Intervention Not Indicated  Housing Interventions Intervention Not Indicated  Transportation Interventions Intervention Not Indicated  Utilities Interventions Intervention Not Indicated  Stress Interventions Intervention Not Indicated  Social Connections Interventions Intervention Not Indicated         Goals Addressed             This Visit's Progress    Goal: CCM (Hyperlipidemia) Expected Outcome: Monitor, Self-Manage and Reduce Symptoms of Hyperlipidemia       Current Barriers:  Chronic Disease Management support and education needs related to Hyperlipidemia  Planned Interventions: Provider established cholesterol goals reviewed; Discussed importance of regular laboratory monitoring as prescribed; Reviewed role and benefits of statin for ASCVD risk reduction; Discussed strategies to manage statin-induced myalgias; Reviewed importance of limiting foods high in cholesterol; Reviewed exercise goals. Currently engages in regular activity. Assessed social determinant of health barriers Advised to notify provider if unable to tolerate statin Advised to read nutrition labels, limit cholesterol intake and avoid highly processed foods when possible.   Lab Results  Component Value Date   CHOL 185 07/22/2022   HDL 33.10 (L) 07/22/2022   LDLCALC 114 (H) 03/04/2021   LDLDIRECT 94.0 07/22/2022   TRIG 372.0 (H)  07/22/2022   CHOLHDL 6 07/22/2022     Symptom Management: Take all medications as prescribed Attend all scheduled provider appointments Call pharmacy for medication refills 3-7 days in advance of running out of medications Call provider office for new concerns or questions  Call doctor with any symptoms you believe are related to your medicine Call doctor when you experience any new symptoms  Follow Up Plan:  Will follow up in three months            Goal: CCM (Hypertension) Expected Outcome: Monitor, Self-Manage and Reduce Symptoms of Hypertension       Current Barriers:  Chronic Disease Management support and education needs related to Hypertension  Planned Interventions: Reviewed plan for hypertension management. Reviewed medications and indications for use. Reports taking all medications as prescribed. Provided information regarding established blood pressure parameters along with indications for notifying a provider. Advised to monitor BP daily and record readings. Reports monitoring as advised. Reports systolic readings have ranged in the 387'F reports diastolic readings have ranged in the 70's. Discussed compliance with recommended cardiac prudent diet. Encouraged to read nutrition labels, monitor sodium intake, and avoid highly processed foods when possible. Discussed complications of uncontrolled blood pressure.  Reviewed s/sx of heart attack, stroke and worsening symptoms that require immediate medical attention.   BP Readings from Last 3 Encounters:  07/22/22 126/80  07/01/22 120/62  06/26/22 132/74    Symptom Management: Take medications as prescribed   Attend all scheduled provider appointments Call pharmacy for medication refills 3-7 days in advance of running out of medications Call provider office for new concerns or questions  Check blood pressure daily Keep a blood pressure  log Call doctor for signs and symptoms of high blood pressure Report new  symptoms to your doctor Consume whole grains, fruits and vegetables, lean meats and healthy fats Limit salt intake  Continue engaging in activity as tolerated.  Follow Up Plan:  Will follow up in three months          PLAN A member of the care management team will follow up in three months.    Horris Latino RN Care Manager/Chronic Care Management [phone]

## 2022-08-24 ENCOUNTER — Ambulatory Visit: Payer: Medicare Other | Admitting: Physician Assistant

## 2022-08-24 ENCOUNTER — Encounter: Payer: Self-pay | Admitting: Physician Assistant

## 2022-08-24 VITALS — BP 140/68 | HR 85 | Ht 70.0 in | Wt 191.1 lb

## 2022-08-24 DIAGNOSIS — R197 Diarrhea, unspecified: Secondary | ICD-10-CM | POA: Diagnosis not present

## 2022-08-24 DIAGNOSIS — K219 Gastro-esophageal reflux disease without esophagitis: Secondary | ICD-10-CM

## 2022-08-24 DIAGNOSIS — Z86010 Personal history of colonic polyps: Secondary | ICD-10-CM

## 2022-08-24 DIAGNOSIS — K52832 Lymphocytic colitis: Secondary | ICD-10-CM | POA: Diagnosis not present

## 2022-08-24 MED ORDER — COLESTIPOL HCL 1 G PO TABS: 1.0000 g | ORAL_TABLET | Freq: Every day | ORAL | 0 refills | Status: DC

## 2022-08-24 NOTE — Progress Notes (Signed)
Noted  

## 2022-08-24 NOTE — Patient Instructions (Signed)
Continue colestipol once a day, if you need more or it is expensive, let us know and we can send it with [facility] If you have worsening diarrhea, will plan for colonoscopy sooner, otherwise due 12/2021  First do a trial off milk/lactose products if you use them.  Add fiber like benefiber or citracel once a day Can do trial of IBGard which is over the counter for AB pain- Take 1-2 capsules once a day for maintence or twice a day during a flare if any worsening symptoms like blood in stool, weight loss, please call the office and we will set you up for a colonoscopy  Please try low FODMAP diet- see below- start with eliminating just one column at a time, the table at the very bottom contains foods that are safe to take   FODMAP stands for fermentable oligo-, di-, mono-saccharides and polyols (1). These are the scientific terms used to classify groups of carbs that are notorious for triggering digestive symptoms like bloating, gas and stomach pain.

## 2022-08-27 DIAGNOSIS — I10 Essential (primary) hypertension: Secondary | ICD-10-CM | POA: Diagnosis not present

## 2022-08-27 DIAGNOSIS — E785 Hyperlipidemia, unspecified: Secondary | ICD-10-CM

## 2022-10-23 ENCOUNTER — Emergency Department (HOSPITAL_COMMUNITY)
Admission: EM | Admit: 2022-10-23 | Discharge: 2022-10-23 | Disposition: A | Discharge status: Home / Self Care | Payer: No Typology Code available for payment source | Attending: Emergency Medicine | Admitting: Emergency Medicine

## 2022-10-23 ENCOUNTER — Emergency Department (HOSPITAL_COMMUNITY): Payer: No Typology Code available for payment source

## 2022-10-23 ENCOUNTER — Encounter (HOSPITAL_COMMUNITY): Payer: Self-pay

## 2022-10-23 DIAGNOSIS — Y92410 Unspecified street and highway as the place of occurrence of the external cause: Secondary | ICD-10-CM | POA: Insufficient documentation

## 2022-10-23 DIAGNOSIS — M545 Low back pain, unspecified: Secondary | ICD-10-CM | POA: Diagnosis present

## 2022-10-23 DIAGNOSIS — M542 Cervicalgia: Secondary | ICD-10-CM | POA: Diagnosis not present

## 2022-10-23 DIAGNOSIS — M546 Pain in thoracic spine: Secondary | ICD-10-CM | POA: Insufficient documentation
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
## 2022-10-23 NOTE — Discharge Instructions (Signed)
Thank you for allowing me to be part of your care today.  Your imaging was reassuring for no acute fracture to your spine.  Should you continue to experience back pain or other musculoskeletal pain, I recommend taking ibuprofen or Aleve during the day.  Be sure you stay well-hydrated while taking this medication.  Please follow-up with your primary care physician or orthopedic office if you continue to have musculoskeletal back pain.  Return to the ED if you develop any worsening of your symptoms or have any new concerns.

## 2022-10-31 ENCOUNTER — Other Ambulatory Visit: Payer: Self-pay | Admitting: Physician Assistant

## 2022-11-09 ENCOUNTER — Encounter: Payer: Self-pay | Admitting: Internal Medicine

## 2022-11-23 ENCOUNTER — Ambulatory Visit (INDEPENDENT_AMBULATORY_CARE_PROVIDER_SITE_OTHER): Payer: Medicare Other

## 2022-11-23 DIAGNOSIS — I10 Essential (primary) hypertension: Secondary | ICD-10-CM

## 2022-11-23 DIAGNOSIS — E785 Hyperlipidemia, unspecified: Secondary | ICD-10-CM

## 2022-11-26 DIAGNOSIS — E785 Hyperlipidemia, unspecified: Secondary | ICD-10-CM

## 2022-11-26 DIAGNOSIS — I10 Essential (primary) hypertension: Secondary | ICD-10-CM | POA: Diagnosis not present
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
## 2023-01-25 ENCOUNTER — Ambulatory Visit (INDEPENDENT_AMBULATORY_CARE_PROVIDER_SITE_OTHER): Payer: Medicare Other | Admitting: Family Medicine

## 2023-01-25 ENCOUNTER — Encounter: Payer: Self-pay | Admitting: Internal Medicine

## 2023-01-25 ENCOUNTER — Encounter: Payer: Self-pay | Admitting: Family Medicine

## 2023-01-25 VITALS — BP 124/80 | HR 83 | Temp 98.0°F | Wt 193.0 lb

## 2023-01-25 DIAGNOSIS — R059 Cough, unspecified: Secondary | ICD-10-CM | POA: Diagnosis not present

## 2023-01-25 DIAGNOSIS — J020 Streptococcal pharyngitis: Secondary | ICD-10-CM

## 2023-01-25 DIAGNOSIS — J029 Acute pharyngitis, unspecified: Secondary | ICD-10-CM

## 2023-01-25 LAB — POCT RAPID STREP A (OFFICE): Rapid Strep A Screen: POSITIVE — AB

## 2023-01-25 LAB — POC COVID19 BINAXNOW: SARS Coronavirus 2 Ag: NEGATIVE

## 2023-01-25 MED ORDER — CEFUROXIME AXETIL 500 MG PO TABS: 500.0000 mg | ORAL_TABLET | Freq: Two times a day (BID) | ORAL | 0 refills | Status: AC

## 2023-01-25 NOTE — Progress Notes (Signed)
   Subjective:    Patient ID: Adam Villanueva, male    DOB: [birth year], 72 y.o.   MRN: [id number]  HPI Here for 10 days of ST and a dry cough. No fever or SOB. Taking Delsym.    Review of Systems  Constitutional: Negative.   HENT:  Positive for congestion, postnasal drip and sore throat. Negative for ear pain and sinus pressure.   Eyes: Negative.   Respiratory:  Positive for cough. Negative for shortness of breath and wheezing.        Objective:   Physical Exam Constitutional:      Appearance: Normal appearance. He is not ill-appearing.  HENT:     Right Ear: Tympanic membrane, ear canal and external ear normal.     Left Ear: Tympanic membrane, ear canal and external ear normal.     Nose: Nose normal.     Mouth/Throat:     Pharynx: Posterior oropharyngeal erythema present. No oropharyngeal exudate.  Eyes:     Conjunctiva/sclera: Conjunctivae normal.  Pulmonary:     Effort: Pulmonary effort is normal.     Breath sounds: Normal breath sounds.  Lymphadenopathy:     Cervical: No cervical adenopathy.  Neurological:     Mental Status: He is alert.           Assessment & Plan:  Strep pharyngitis, treat with 10 days of Cefuroxime.  Gershon Crane, MD

## 2023-02-01 ENCOUNTER — Ambulatory Visit (INDEPENDENT_AMBULATORY_CARE_PROVIDER_SITE_OTHER): Payer: Medicare Other | Admitting: Family Medicine

## 2023-02-01 VITALS — BP 122/74 | HR 79 | Temp 98.0°F | Wt 196.6 lb

## 2023-02-01 DIAGNOSIS — K429 Umbilical hernia without obstruction or gangrene: Secondary | ICD-10-CM

## 2023-02-01 NOTE — Progress Notes (Signed)
   Subjective:    Patient ID: Adam Villanueva, male    DOB: July 15, 1951, 72 y.o.   MRN: [id number]  HPI Here to check a lump near his bellybutton that he noticed last weekend. There is no pain. His BM's are regular.    Review of Systems  Respiratory: Negative.    Cardiovascular: Negative.   Gastrointestinal: Negative.   Genitourinary: Negative.        Objective:   Physical Exam Constitutional:      Appearance: Normal appearance.  Cardiovascular:     Rate and Rhythm: Normal rate and regular rhythm.     Pulses: Normal pulses.     Heart sounds: Normal heart sounds.  Pulmonary:     Effort: Pulmonary effort is normal.     Breath sounds: Normal breath sounds.  Abdominal:     General: Abdomen is flat. Bowel sounds are normal. There is no distension.     Palpations: Abdomen is soft.     Tenderness: There is no guarding or rebound.     Comments: There is a tiny hernia at the superior edge of the umbilicus. This is not tender and is easily reducible   Neurological:     Mental Status: He is alert.           Assessment & Plan:  Early umbilical hernia. At this point we will simply observe this. He will follow up if it gets bigger or becomes painful.  Gershon Crane, MD

## 2023-02-17 ENCOUNTER — Encounter: Payer: Self-pay | Admitting: Gastroenterology

## 2023-02-17 ENCOUNTER — Telehealth: Payer: Self-pay | Admitting: Gastroenterology

## 2023-02-17 ENCOUNTER — Ambulatory Visit: Payer: Medicare Other | Admitting: Gastroenterology

## 2023-02-17 VITALS — BP 124/70 | HR 77 | Ht 70.0 in | Wt 193.6 lb

## 2023-02-17 DIAGNOSIS — Z860100 Personal history of colon polyps, unspecified: Secondary | ICD-10-CM | POA: Insufficient documentation

## 2023-02-17 MED ORDER — NA SULFATE-K SULFATE-MG SULF 17.5-3.13-1.6 GM/177ML PO SOLN: 1.0000 | Freq: Once | ORAL | 0 refills | Status: AC

## 2023-02-17 NOTE — Telephone Encounter (Signed)
Will send to [facility] pt seen in the office today

## 2023-02-17 NOTE — Patient Instructions (Signed)
You have been scheduled for a colonoscopy. Please follow written instructions given to you at your visit today.  Please pick up your prep supplies at the pharmacy within the next 1-3 days. If you use inhalers (even only as needed), please bring them with you on the day of your procedure.  _______________________________________________________  If your blood pressure at your visit was 140/90 or greater, please contact your primary care physician to follow up on this.  _______________________________________________________  If you are age 67 or older, your body mass index should be between 23-30. Your Body mass index is 27.78 kg/m. If this is out of the aforementioned range listed, please consider follow up with your Primary Care Provider.  If you are age 59 or younger, your body mass index should be between 19-25. Your Body mass index is 27.78 kg/m. If this is out of the aformentioned range listed, please consider follow up with your Primary Care Provider.   ________________________________________________________  The [facility] providers would like to encourage you to use [facility] to communicate with providers for non-urgent requests or questions.  Due to long hold times on the telephone, sending your provider a message by [facility] may be a faster and more efficient way to get a response.  Please allow 48 business hours for a response.  Please remember that this is for non-urgent requests.  _______________________________________________________

## 2023-02-17 NOTE — Telephone Encounter (Signed)
Inbound call from patient requesting a call back from nurse regarding today's appt. Patient stated that his insurance wont cover the prep medication for up coming procedure .Please advise.

## 2023-02-17 NOTE — Progress Notes (Signed)
Noted  

## 2023-02-17 NOTE — Progress Notes (Signed)
02/17/2023 Adam Villanueva [id number] [birth year]   HISTORY OF PRESENT ILLNESS: This is a 72 year old male who is a patient of Dr. Lamar Sprinkles.  He had a colonoscopy in April 2018 with 6 mm sessile cecal polyp as well as diverticulosis and internal  hemorrhoids.  Surgical [P], cecum, polyp - HYPERPLASTIC POLYP, FRAGMENTED. - BACKGROUND FRAGMENTS OF BENIGN COLORECTAL MUCOSA DEMONSTRATING INTRAEPITHELIAL LYMPHOCYTOSIS. - NO DYSPLASIA OR MALIGNANCY IDENTIFIED. - SEE COMMENT.  Repeat recommended in 5 years.  He is here today to schedule that.  He also has been found to have lymphocytic colitis.  He had been on budesonide for a while, but now has been off of that and just taking 1 colestipol daily.  No issues with recurrent diarrhea.  Past Medical History:  Diagnosis Date   Allergy    Anxiety    Arthritis    Cataract    Chronic insomnia    GERD (gastroesophageal reflux disease)    Hyperlipidemia    Hypertension    Lower back pain    and mid back pain   Prostatitis    Sleep apnea    no cpap   Past Surgical History:  Procedure Laterality Date   COLONOSCOPY     POLYPECTOMY     SEPTOPLASTY     broken nose   WISDOM TOOTH EXTRACTION      reports that he has never smoked. He has never used smokeless tobacco. He reports current alcohol use. He reports that he does not use drugs. family history includes Alzheimer's disease in his father; Arthritis in his father, maternal grandfather, maternal grandmother, mother, paternal grandfather, and paternal grandmother; Hyperlipidemia in his mother; Hypertension in his father and mother; Rectal cancer in his mother; Throat cancer in his brother. No Known Allergies    Outpatient Encounter Medications as of 02/17/2023  Medication Sig   atorvastatin (LIPITOR) 20 MG tablet Take 1 tablet (20 mg total) by mouth daily.   cholecalciferol (VITAMIN D) 1000 units tablet Take 1,000 Units by mouth daily.   colestipol (COLESTID) 1 g tablet TAKE 1  TABLET BY MOUTH DAILY   esomeprazole (NEXIUM) 20 MG capsule 20 mg. Uses only PRN for flare ups.   famotidine (PEPCID) 40 MG tablet Take 1 tablet (40 mg total) by mouth 2 (two) times daily. (Patient taking differently: Take 40 mg by mouth daily.)   ibuprofen (ADVIL,MOTRIN) 200 MG tablet Take 400 mg by mouth every 6 (six) hours as needed (pain).   lisinopril (ZESTRIL) 40 MG tablet Take 1 tablet (40 mg total) by mouth daily.   metoprolol succinate (TOPROL-XL) 50 MG 24 hr tablet TAKE 1 TABLET BY MOUTH DAILY  WITH OR IMMEDIATELY FOLLOWING A  MEAL   Multiple Vitamin (MULTIVITAMIN) tablet Take 1 tablet by mouth daily.   Omega-3 Fatty Acids (OMEGA-3 FISH OIL PO) Take 2,400 mg by mouth in the morning and at bedtime.   pantoprazole (PROTONIX) 40 MG tablet Take 1 tablet (40 mg total) by mouth daily.   polycarbophil (FIBERCON) 625 MG tablet Take 625 mg by mouth daily.   tamsulosin (FLOMAX) 0.4 MG CAPS capsule Take 0.4 mg by mouth.   Turmeric 400 MG CAPS Take by mouth as needed.   vitamin C (ASCORBIC ACID) 500 MG tablet Take 500 mg by mouth daily.   No facility-administered encounter medications on file as of 02/17/2023.    REVIEW OF SYSTEMS  : All other systems reviewed and negative except where noted in the History of Present Illness.   PHYSICAL  EXAM: BP 124/70   Pulse 77   Ht 5\' 10"  (1.778 m)   Wt 193 lb 9.6 oz (87.8 kg)   SpO2 98%   BMI 27.78 kg/m  General: Well developed white male in no acute distress Head: Normocephalic and atraumatic Eyes:  Sclerae anicteric, conjunctiva pink. Ears: Normal auditory acuity Lungs: Clear throughout to auscultation; no W/R/R. Heart: Regular rate and rhythm; no M/R/G. Abdomen: Soft, non-distended.  BS present.  Non-tender. Rectal:  Will be done at the time of colonoscopy.  Musculoskeletal: Symmetrical with no gross deformities  Skin: No lesions on visible extremities Extremities: No edema  Neurological: Alert oriented x 4, grossly  non-focal Psychological:  Alert and cooperative. Normal mood and affect  ASSESSMENT AND PLAN: *Personal history of colon polyps:  Last colonoscopy 12/2016 with a 6 mm sessile cecal polyp removed.  Repeat recommended in 5 years. *Lymphocytic colitis:  Had been on budesonide in the past, but is now just taking 1 colestipol daily and has been doing well in regards to diarrhea.  **Colonoscopy scheduled Dr. Marina Goodell.  The risks, benefits, and alternatives to colonoscopy were discussed with the patient and he consents to proceed.   CC:  Kristian Covey, MD

## 2023-02-19 NOTE — Telephone Encounter (Signed)
Patient used [employer] and able to get prep for $47.

## 2023-03-03 ENCOUNTER — Encounter: Payer: Self-pay | Admitting: Internal Medicine

## 2023-03-03 NOTE — Telephone Encounter (Signed)
Please let the patient know that since he has been doing well for so long, and was doing well as recently as a few weeks ago, that I would recommend antidiarrheals such as Imodium for now.  We can perform biopsies on the colon if he still having diarrhea issues when he presents for his upcoming colonoscopy in a few weeks.  Thanks

## 2023-03-04 NOTE — Telephone Encounter (Signed)
Patient called to inform of Dr. Lamar Sprinkles recommendations, may have Imodium for diarrhea and will discuss the possibility of biopsies of the colon if he is still having diarrhea when his colonoscopy is due on 03/23/23. Patient understood and agreed.

## 2023-03-08 ENCOUNTER — Ambulatory Visit
Admission: EM | Admit: 2023-03-08 | Discharge: 2023-03-08 | Disposition: A | Discharge status: Home / Self Care | Payer: Medicare Other | Attending: Internal Medicine | Admitting: Internal Medicine

## 2023-03-08 DIAGNOSIS — H938X3 Other specified disorders of ear, bilateral: Secondary | ICD-10-CM

## 2023-03-08 DIAGNOSIS — H6123 Impacted cerumen, bilateral: Secondary | ICD-10-CM | POA: Diagnosis not present

## 2023-03-08 MED ORDER — FLUTICASONE PROPIONATE 50 MCG/ACT NA SUSP: 1.0000 | Freq: Every day | NASAL | 0 refills | Status: AC

## 2023-03-08 MED ORDER — CETIRIZINE HCL 5 MG PO TABS: 5.0000 mg | ORAL_TABLET | Freq: Every day | ORAL | 0 refills | Status: DC

## 2023-03-08 NOTE — ED Provider Notes (Signed)
[name]    CSN: [id number] Arrival date & time: 03/08/23  0836      History   Chief Complaint Chief Complaint  Patient presents with   Otalgia    HPI Adam Villanueva is a 72 y.o. male.   Patient presents with feelings of ear pressure that started a few days ago.  Denies any associated trauma, foreign body, drainage from the ears.  Denies nasal congestion, runny nose, cough.  Patient has taken NSAIDs for symptoms.   Otalgia   Past Medical History:  Diagnosis Date   Allergy    Anxiety    Arthritis    Cataract    Chronic insomnia    GERD (gastroesophageal reflux disease)    Hyperlipidemia    Hypertension    Lower back pain    and mid back pain   Prostatitis    Sleep apnea    no cpap    Patient Active Problem List   Diagnosis Date Noted   History of colonic polyps 02/17/2023   Umbilical hernia without obstruction and without gangrene 02/01/2023   Osteoarthritis, multiple sites 05/26/2013   Chronic insomnia 01/26/2012   Hyperlipidemia 10/03/2010   Essential hypertension 10/03/2010   ESOPHAGITIS, REFLUX 10/03/2010   GERD 10/03/2010   NAUSEA 10/03/2010   DYSPHAGIA UNSPECIFIED 10/03/2010   DYSPHAGIA 10/03/2010   ABDOMINAL PAIN, LEFT UPPER QUADRANT 10/03/2010   ABDOMINAL PAIN-MULTIPLE SITES 10/03/2010    Past Surgical History:  Procedure Laterality Date   COLONOSCOPY     POLYPECTOMY     SEPTOPLASTY     broken nose   WISDOM TOOTH EXTRACTION         Home Medications    Prior to Admission medications   Medication Sig Start Date End Date Taking? Authorizing Provider  cetirizine (ZYRTEC) 5 MG tablet Take 1 tablet (5 mg total) by mouth daily. 03/08/23  Yes Darrious Youman, Rolly Salter E, FNP  fluticasone (FLONASE) 50 MCG/ACT nasal spray Place 1 spray into both nostrils daily. 03/08/23  Yes Rona Tomson, Rolly Salter E, FNP  atorvastatin (LIPITOR) 20 MG tablet Take 1 tablet (20 mg total) by mouth daily. 07/22/22   Burchette, Elberta Fortis, MD  cholecalciferol (VITAMIN D) 1000  units tablet Take 1,000 Units by mouth daily.    [provider]  colestipol (COLESTID) 1 g tablet TAKE 1 TABLET BY MOUTH DAILY 11/02/22   Doree Albee, PA-C  esomeprazole (NEXIUM) 20 MG capsule 20 mg. Uses only PRN for flare ups.    [provider]  famotidine (PEPCID) 40 MG tablet Take 1 tablet (40 mg total) by mouth 2 (two) times daily. Patient taking differently: Take 40 mg by mouth daily. 06/26/22   Doree Albee, PA-C  ibuprofen (ADVIL,MOTRIN) 200 MG tablet Take 400 mg by mouth every 6 (six) hours as needed (pain).    [provider]  lisinopril (ZESTRIL) 40 MG tablet Take 1 tablet (40 mg total) by mouth daily. 07/22/22   Burchette, Elberta Fortis, MD  metoprolol succinate (TOPROL-XL) 50 MG 24 hr tablet TAKE 1 TABLET BY MOUTH DAILY  WITH OR IMMEDIATELY FOLLOWING A  MEAL 07/22/22   Burchette, Elberta Fortis, MD  Multiple Vitamin (MULTIVITAMIN) tablet Take 1 tablet by mouth daily.    [provider]  Omega-3 Fatty Acids (OMEGA-3 FISH OIL PO) Take 2,400 mg by mouth in the morning and at bedtime.    [provider]  pantoprazole (PROTONIX) 40 MG tablet Take 1 tablet (40 mg total) by mouth daily. 07/22/22   Burchette,  Elberta Fortis, MD  polycarbophil (FIBERCON) 625 MG tablet Take 625 mg by mouth daily.    [provider]  tamsulosin (FLOMAX) 0.4 MG CAPS capsule Take 0.4 mg by mouth.    [provider]  Turmeric 400 MG CAPS Take by mouth as needed.    [provider]  vitamin C (ASCORBIC ACID) 500 MG tablet Take 500 mg by mouth daily.    [provider]    Family History Family History  Problem Relation Age of Onset   Arthritis Mother        rheumatoid   Hyperlipidemia Mother    Hypertension Mother    Rectal cancer Mother        16 years   Arthritis Father    Hypertension Father    Alzheimer's disease Father    Throat cancer Brother    Arthritis Maternal Grandmother    Arthritis Maternal Grandfather    Arthritis  Paternal Grandmother    Arthritis Paternal Grandfather    Colon polyps Neg Hx    Esophageal cancer Neg Hx    Stomach cancer Neg Hx     Social History Social History   Tobacco Use   Smoking status: Never   Smokeless tobacco: Never  Vaping Use   Vaping Use: Never used  Substance Use Topics   Alcohol use: Yes    Alcohol/week: 0.0 standard drinks of alcohol    Comment: occ beer   Drug use: No     Allergies   Patient has no known allergies.   Review of Systems Review of Systems Per HPI  Physical Exam Triage Vital Signs ED Triage Vitals  Enc Vitals Group     BP 03/08/23 0936 (!) 155/87     Pulse Rate 03/08/23 0936 79     Resp 03/08/23 0936 16     Temp 03/08/23 0936 97.8 F (36.6 C)     Temp Source 03/08/23 0936 Oral     SpO2 03/08/23 0936 97 %     Weight --      Height --      Head Circumference --      Peak Flow --      Pain Score 03/08/23 0935 3     Pain Loc --      Pain Edu? --      Excl. in GC? --    No data found.  Updated Vital Signs BP (!) 155/87 (BP Location: Left Arm)   Pulse 79   Temp 97.8 F (36.6 C) (Oral)   Resp 16   SpO2 97%   Visual Acuity Right Eye Distance:   Left Eye Distance:   Bilateral Distance:    Right Eye Near:   Left Eye Near:    Bilateral Near:     Physical Exam Constitutional:      General: He is not in acute distress.    Appearance: Normal appearance. He is not toxic-appearing or diaphoretic.  HENT:     Head: Normocephalic and atraumatic.     Right Ear: External ear normal. No swelling or tenderness. A middle ear effusion is present. There is impacted cerumen. Tympanic membrane is not perforated, erythematous or bulging.     Left Ear: External ear normal. No swelling or tenderness. There is impacted cerumen.     Ears:     Comments: On original physical exam, patient has bilateral impacted cerumen.  Ears were irrigated twice but cerumen was not able to be completely removed.  Slightly able to visualize  TM of right ear  and it patient had impacted cerumen on her appears to have fluid behind TM with no discoloration.  Unable to visualize left TM. Eyes:     Extraocular Movements: Extraocular movements intact.     Conjunctiva/sclera: Conjunctivae normal.  Pulmonary:     Effort: Pulmonary effort is normal.  Neurological:     General: No focal deficit present.     Mental Status: He is alert and oriented to person, place, and time. Mental status is at baseline.  Psychiatric:        Mood and Affect: Mood normal.        Behavior: Behavior normal.        Thought Content: Thought content normal.        Judgment: Judgment normal.      UC Treatments / Results  Labs (all labs ordered are listed, but only abnormal results are displayed) Labs Reviewed - No data to display  EKG   Radiology No results found.  Procedures Procedures (including critical care time)  Medications Ordered in UC Medications - No data to display  Initial Impression / Assessment and Plan / UC Course  I have reviewed the triage vital signs and the nursing notes.  Pertinent labs & imaging results that were available during my care of the patient were reviewed by me and considered in my medical decision making (see chart for details).     Ears were irrigated twice without successful removal of cerumen.  Attempted to use curette to remove earwax but was also not successful with this.  Mild amount of cerumen was removed from right ear.  Unable to slightly visualize left TM but patient appears to have fluid behind right TM.   Will treat fluid behind TM with antihistamine and Flonase as patient denies that he takes these daily.  Encouraged use of Debrox over-the-counter for impacted cerumen and following up with urgent care or ENT at provided contact information for further evaluation and management of impacted cerumen.  Patient verbalized understanding and was agreeable with plan. Final Clinical Impressions(s) / UC Diagnoses   Final  diagnoses:  Bilateral impacted cerumen     Discharge Instructions      I have prescribed you antihistamine and nasal spray to help alleviate fluid behind eardrums.  Also recommend that you use Debrox that you can get over-the-counter for up to 4 days.  You may return to see if they can help wash out your ears or you may follow-up with ear doctor at provided contact information.     ED Prescriptions     Medication Sig Dispense Auth. Provider   cetirizine (ZYRTEC) 5 MG tablet Take 1 tablet (5 mg total) by mouth daily. 30 tablet [place], Streeter E, [place]   fluticasone [facility]) 50 MCG/ACT nasal spray Place 1 spray into both nostrils daily. 16 g Gustavus Bryant, [place]      PDMP not reviewed this encounter.   Gustavus Bryant, [place] 03/08/23 1122

## 2023-03-08 NOTE — ED Triage Notes (Signed)
Patient presents to [facility] for bilateral ear pain since Friday. States feels like "swimmers ear" has not gone swimming. Taking NSAIDS.

## 2023-03-08 NOTE — Discharge Instructions (Signed)
I have prescribed you antihistamine and nasal spray to help alleviate fluid behind eardrums.  Also recommend that you use Debrox that you can get over-the-counter for up to 4 days.  You may return to see if they can help wash out your ears or you may follow-up with ear doctor at provided contact information.

## 2023-03-25 ENCOUNTER — Ambulatory Visit: Payer: Medicare Other

## 2023-03-26 ENCOUNTER — Ambulatory Visit (AMBULATORY_SURGERY_CENTER): Payer: Medicare Other | Admitting: Internal Medicine

## 2023-03-26 ENCOUNTER — Encounter: Payer: Self-pay | Admitting: Internal Medicine

## 2023-03-26 VITALS — BP 110/64 | HR 78 | Temp 98.6°F | Resp 18 | Ht 70.0 in | Wt 193.0 lb

## 2023-03-26 DIAGNOSIS — Z09 Encounter for follow-up examination after completed treatment for conditions other than malignant neoplasm: Secondary | ICD-10-CM | POA: Diagnosis not present

## 2023-03-26 DIAGNOSIS — Z860100 Personal history of colon polyps, unspecified: Secondary | ICD-10-CM

## 2023-03-26 DIAGNOSIS — K6389 Other specified diseases of intestine: Secondary | ICD-10-CM | POA: Diagnosis not present

## 2023-03-26 DIAGNOSIS — R197 Diarrhea, unspecified: Secondary | ICD-10-CM | POA: Diagnosis not present

## 2023-03-26 DIAGNOSIS — I10 Essential (primary) hypertension: Secondary | ICD-10-CM | POA: Diagnosis not present

## 2023-03-26 MED ORDER — SODIUM CHLORIDE 0.9 % IV SOLN: 500.0000 mL | Freq: Once | INTRAVENOUS | Status: DC

## 2023-03-26 NOTE — Progress Notes (Signed)
Pt's states no medical or surgical changes since previsit or office visit. 

## 2023-03-26 NOTE — Patient Instructions (Signed)
YOU HAD AN ENDOSCOPIC PROCEDURE TODAY AT THE [facility]:   Refer to the procedure report that was given to you for any specific questions about what was found during the examination.  If the procedure report does not answer your questions, please call your gastroenterologist to clarify.  If you requested that your care partner not be given the details of your procedure findings, then the procedure report has been included in a sealed envelope for you to review at your convenience later.  YOU SHOULD EXPECT: Some feelings of bloating in the abdomen. Passage of more gas than usual.  Walking can help get rid of the air that was put into your GI tract during the procedure and reduce the bloating. If you had a lower endoscopy (such as a colonoscopy or flexible sigmoidoscopy) you may notice spotting of blood in your stool or on the toilet paper. If you underwent a bowel prep for your procedure, you may not have a normal bowel movement for a few days.  Please Note:  You might notice some irritation and congestion in your nose or some drainage.  This is from the oxygen used during your procedure.  There is no need for concern and it should clear up in a day or so.  SYMPTOMS TO REPORT IMMEDIATELY:  Following lower endoscopy (colonoscopy or flexible sigmoidoscopy):  Excessive amounts of blood in the stool  Significant tenderness or worsening of abdominal pains  Swelling of the abdomen that is new, acute  Fever of 100F or higher  For urgent or emergent issues, a gastroenterologist can be reached at any hour by calling [phone]. Do not use MyChart messaging for urgent concerns.    DIET:  We do recommend a small meal at first, but then you may proceed to your regular diet.  Drink plenty of fluids but you should avoid alcoholic beverages for 24 hours.  ACTIVITY:  You should plan to take it easy for the rest of today and you should NOT DRIVE or use heavy machinery until tomorrow (because of  the sedation medicines used during the test).    FOLLOW UP: Our staff will call the number listed on your records the next business day following your procedure.  We will call around 7:15- 8:00 am to check on you and address any questions or concerns that you may have regarding the information given to you following your procedure. If we do not reach you, we will leave a message.     If any biopsies were taken you will be contacted by phone or by letter within the next 1-3 weeks.  Please call us at [phone] if you have not heard about the biopsies in 3 weeks.    SIGNATURES/CONFIDENTIALITY: You and/or your care partner have signed paperwork which will be entered into your electronic medical record.  These signatures attest to the fact that that the information above on your After Visit Summary has been reviewed and is understood.  Full responsibility of the confidentiality of this discharge information lies with you and/or your care-partner.  

## 2023-03-26 NOTE — Progress Notes (Signed)
Uneventful anesthetic. Report to pacu rn. Vss. Care resumed by rn. 

## 2023-03-26 NOTE — Op Note (Signed)
[facility] Patient Name: Adam Villanueva Procedure Date: 03/26/2023 11:08 AM MRN: [id number] Endoscopist: Wilhemina Bonito. Marina Goodell , MD, [id number] Age: 72 Referring MD:  Date of Birth: [birth year] Gender: Male Account #: [id number] Procedure:                Colonoscopy with biopsies Indications:              High risk colon cancer surveillance: Personal                            history of colonic polyps (right-sided hyperplastic                            sessile lesion). Previous examinations 2008, 2018.                            History of lymphocytic colitis. Was having diarrhea                            recently, which has resolved. Medicines:                Monitored Anesthesia Care Procedure:                Pre-Anesthesia Assessment:                           - Prior to the procedure, a History and Physical                            was performed, and patient medications and                            allergies were reviewed. The patient's tolerance of                            previous anesthesia was also reviewed. The risks                            and benefits of the procedure and the sedation                            options and risks were discussed with the patient.                            All questions were answered, and informed consent                            was obtained. Prior Anticoagulants: The patient has                            taken no anticoagulant or antiplatelet agents. ASA                            Grade Assessment: II - A patient with mild systemic  disease. After reviewing the risks and benefits,                            the patient was deemed in satisfactory condition to                            undergo the procedure.                           After obtaining informed consent, the colonoscope                            was passed under direct vision. Throughout the                            procedure, the  patient's blood pressure, pulse, and                            oxygen saturations were monitored continuously. The                            Olympus Scope [id number] was introduced through the                            anus and advanced to the the cecum, identified by                            appendiceal orifice and ileocecal valve. The                            ileocecal valve, appendiceal orifice, and rectum                            were photographed. The quality of the bowel                            preparation was excellent. The colonoscopy was                            performed without difficulty. The patient tolerated                            the procedure well. The bowel preparation used was                            SUPREP via split dose instruction. Scope In: 11:26:54 AM Scope Out: 11:37:20 AM Scope Withdrawal Time: 0 hours 7 minutes 59 seconds  Total Procedure Duration: 0 hours 10 minutes 26 seconds  Findings:                 Multiple diverticula were found in the sigmoid                            colon.  The exam was otherwise without abnormality on                            direct and retroflexion views. Small internal                            hemorrhoids.                           Biopsies for histology were taken with a cold                            forceps from the entire colon for evaluation of                            microscopic colitis. Complications:            No immediate complications. Estimated blood loss:                            None. Estimated Blood Loss:     Estimated blood loss: none. Impression:               - Diverticulosis in the sigmoid colon.                           - The examination was otherwise normal on direct                            and retroflexion views.                           - Biopsies were taken with a cold forceps from the                            entire colon for evaluation of  microscopic colitis. Recommendation:           - Repeat colonoscopy is not recommended for                            surveillance.                           - Patient has a contact number available for                            emergencies. The signs and symptoms of potential                            delayed complications were discussed with the                            patient. Return to normal activities tomorrow.                            Written discharge instructions were provided to the  patient.                           - Resume previous diet.                           - Continue present medications.                           - Await pathology results. Wilhemina Bonito. Marina Goodell, MD 03/26/2023 11:47:21 AM This report has been signed electronically.

## 2023-03-26 NOTE — Progress Notes (Signed)
Called to room to assist during endoscopic procedure.  Patient ID and intended procedure confirmed with present staff. Received instructions for my participation in the procedure from the performing physician.  

## 2023-03-26 NOTE — Progress Notes (Signed)
02/17/2023 Adam Villanueva [id number] [birth year]     HISTORY OF PRESENT ILLNESS: This is a 72 year old male who is a patient of Dr. Lamar Sprinkles.   He had a colonoscopy in April 2018 with 6 mm sessile cecal polyp as well as diverticulosis and internal  hemorrhoids.   Surgical [P], cecum, polyp - HYPERPLASTIC POLYP, FRAGMENTED. - BACKGROUND FRAGMENTS OF BENIGN COLORECTAL MUCOSA DEMONSTRATING INTRAEPITHELIAL LYMPHOCYTOSIS. - NO DYSPLASIA OR MALIGNANCY IDENTIFIED. - SEE COMMENT.   Repeat recommended in 5 years.   He is here today to schedule that.  He also has been found to have lymphocytic colitis.  He had been on budesonide for a while, but now has been off of that and just taking 1 colestipol daily.  No issues with recurrent diarrhea.       Past Medical History:  Diagnosis Date   Allergy     Anxiety     Arthritis     Cataract     Chronic insomnia     GERD (gastroesophageal reflux disease)     Hyperlipidemia     Hypertension     Lower back pain      and mid back pain   Prostatitis     Sleep apnea      no cpap         Past Surgical History:  Procedure Laterality Date   COLONOSCOPY       POLYPECTOMY       SEPTOPLASTY        broken nose   WISDOM TOOTH EXTRACTION         reports that he has never smoked. He has never used smokeless tobacco. He reports current alcohol use. He reports that he does not use drugs. family history includes Alzheimer's disease in his father; Arthritis in his father, maternal grandfather, maternal grandmother, mother, paternal grandfather, and paternal grandmother; Hyperlipidemia in his mother; Hypertension in his father and mother; Rectal cancer in his mother; Throat cancer in his brother. No Known Allergies         Outpatient Encounter Medications as of 02/17/2023  Medication Sig   atorvastatin (LIPITOR) 20 MG tablet Take 1 tablet (20 mg total) by mouth daily.   cholecalciferol (VITAMIN D) 1000 units tablet Take 1,000 Units by mouth  daily.   colestipol (COLESTID) 1 g tablet TAKE 1 TABLET BY MOUTH DAILY   esomeprazole (NEXIUM) 20 MG capsule 20 mg. Uses only PRN for flare ups.   famotidine (PEPCID) 40 MG tablet Take 1 tablet (40 mg total) by mouth 2 (two) times daily. (Patient taking differently: Take 40 mg by mouth daily.)   ibuprofen (ADVIL,MOTRIN) 200 MG tablet Take 400 mg by mouth every 6 (six) hours as needed (pain).   lisinopril (ZESTRIL) 40 MG tablet Take 1 tablet (40 mg total) by mouth daily.   metoprolol succinate (TOPROL-XL) 50 MG 24 hr tablet TAKE 1 TABLET BY MOUTH DAILY  WITH OR IMMEDIATELY FOLLOWING A  MEAL   Multiple Vitamin (MULTIVITAMIN) tablet Take 1 tablet by mouth daily.   Omega-3 Fatty Acids (OMEGA-3 FISH OIL PO) Take 2,400 mg by mouth in the morning and at bedtime.   pantoprazole (PROTONIX) 40 MG tablet Take 1 tablet (40 mg total) by mouth daily.   polycarbophil (FIBERCON) 625 MG tablet Take 625 mg by mouth daily.   tamsulosin (FLOMAX) 0.4 MG CAPS capsule Take 0.4 mg by mouth.   Turmeric 400 MG CAPS Take by mouth as needed.   vitamin C (ASCORBIC  ACID) 500 MG tablet Take 500 mg by mouth daily.    No facility-administered encounter medications on file as of 02/17/2023.      REVIEW OF SYSTEMS  : All other systems reviewed and negative except where noted in the History of Present Illness.     PHYSICAL EXAM: BP 124/70   Pulse 77   Ht 5\' 10"  (1.778 m)   Wt 193 lb 9.6 oz (87.8 kg)   SpO2 98%   BMI 27.78 kg/m  General: Well developed white male in no acute distress Head: Normocephalic and atraumatic Eyes:  Sclerae anicteric, conjunctiva pink. Ears: Normal auditory acuity Lungs: Clear throughout to auscultation; no W/R/R. Heart: Regular rate and rhythm; no M/R/G. Abdomen: Soft, non-distended.  BS present.  Non-tender. Rectal:  Will be done at the time of colonoscopy.  Musculoskeletal: Symmetrical with no gross deformities  Skin: No lesions on visible extremities Extremities: No edema   Neurological: Alert oriented x 4, grossly non-focal Psychological:  Alert and cooperative. Normal mood and affect   ASSESSMENT AND PLAN: *Personal history of colon polyps:  Last colonoscopy 12/2016 with a 6 mm sessile cecal polyp removed.  Repeat recommended in 5 years. *Lymphocytic colitis:  Had been on budesonide in the past, but is now just taking 1 colestipol daily and has been doing well in regards to diarrhea.   **Colonoscopy scheduled Dr. Marina Goodell.  The risks, benefits, and alternatives to colonoscopy were discussed with the patient and he consents to proceed.    CC:  Kristian Covey, MD  As above.  The patient complains of diarrhea.  Now for colonoscopy

## 2023-03-29 ENCOUNTER — Telehealth: Payer: Self-pay | Admitting: *Deleted

## 2023-03-29 ENCOUNTER — Ambulatory Visit: Payer: Self-pay

## 2023-03-29 NOTE — Telephone Encounter (Signed)
  Follow up Call-     03/26/2023   10:34 AM  Call back number  Post procedure Call Back phone  # [phone]  Permission to leave phone message Yes     Patient questions:  Do you have a fever, pain , or abdominal swelling? No. Pain Score  0 *  Have you tolerated food without any problems? Yes.    Have you been able to return to your normal activities? Yes.    Do you have any questions about your discharge instructions: Diet   No. Medications  No. Follow up visit  No.  Do you have questions or concerns about your Care? No.  Actions: * If pain score is 4 or above: No action needed, pain <4.

## 2023-03-30 ENCOUNTER — Encounter: Payer: Self-pay | Admitting: Family Medicine

## 2023-03-30 ENCOUNTER — Ambulatory Visit (INDEPENDENT_AMBULATORY_CARE_PROVIDER_SITE_OTHER): Payer: Medicare Other | Admitting: Family Medicine

## 2023-03-30 VITALS — BP 152/80 | HR 75 | Temp 97.5°F | Ht 70.0 in | Wt 188.8 lb

## 2023-03-30 DIAGNOSIS — I10 Essential (primary) hypertension: Secondary | ICD-10-CM

## 2023-03-30 DIAGNOSIS — G8929 Other chronic pain: Secondary | ICD-10-CM

## 2023-03-30 DIAGNOSIS — M545 Low back pain, unspecified: Secondary | ICD-10-CM

## 2023-03-30 DIAGNOSIS — E785 Hyperlipidemia, unspecified: Secondary | ICD-10-CM

## 2023-03-30 MED ORDER — EZETIMIBE 10 MG PO TABS: 10.0000 mg | ORAL_TABLET | Freq: Every day | ORAL | 5 refills | Status: DC

## 2023-03-30 NOTE — Patient Instructions (Signed)
Set up physical for October of this year.
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
## 2023-03-31 ENCOUNTER — Encounter: Payer: Self-pay | Admitting: Internal Medicine

## 2023-04-22 NOTE — Progress Notes (Unsigned)
[facility] [address] [place] [address] Phone: [phone] Subjective:   Adam Villanueva, am serving as a scribe for Dr. Antoine Primas.  I'm seeing this patient by the request  of:  Kristian Covey, MD  CC: Polyarthralgia  [id number]  Adam Villanueva is a 72 y.o. male coming in with complaint of polyarthralgia. Pain in lumbar spine, hips, hands, and feet. Pain will flare for a couple of days and then goes away. Symptoms worsening in past 2 months. Pain from lateral hips radiate into the quads.  Strenuous lifting increases his pain. 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 Was doing yoga prior to COVID which was helpful. Also notes weakness in hands.       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  Past Medical History:  Diagnosis Date   Allergy    Anxiety    Arthritis    Cataract    Chronic insomnia    GERD (gastroesophageal reflux disease)    Hyperlipidemia    Hypertension    Lower back pain    and mid back pain   Prostatitis    Sleep apnea    no cpap   Past Surgical History:  Procedure Laterality Date   COLONOSCOPY     POLYPECTOMY     SEPTOPLASTY     broken nose   WISDOM TOOTH EXTRACTION     Social History   Socioeconomic History   Marital status: Married    Spouse name: Not on file   Number of children: Not on file   Years of education: Not on file   Highest education level: Associate degree: occupational, Scientist, product/process development, or vocational program  Occupational History   Not on file  Tobacco Use   Smoking status: Never   Smokeless tobacco: Never  Vaping Use   Vaping status: Never Used  Substance and Sexual Activity   Alcohol use: Yes    Alcohol/week: 0.0 standard drinks of alcohol    Comment: occ  beer   Drug use: No   Sexual activity: Yes    Partners: Female  Other Topics Concern   Not on file  Social History Narrative   Not on file   Social Determinants of Health   Financial Resource Strain: Low Risk  (02/01/2023)   Overall Financial Resource Strain (CARDIA)    Difficulty of Paying Living Expenses: Not hard at all  Food Insecurity: Unknown (02/01/2023)   Hunger Vital Sign    Worried About Running Out of Food in the Last Year: Never true    Ran Out of Food in the Last Year: Patient declined  Transportation Needs: No Transportation Needs (02/01/2023)   PRAPARE - Administrator, Civil Service (Medical): No    Lack of Transportation (Non-Medical): No  Physical Activity: Insufficiently Active (02/01/2023)   Exercise Vital Sign    Days of Exercise per Week: 4 days    Minutes of Exercise per Session: 30 min  Stress: No Stress Concern Present (02/01/2023)   [employer] of Occupational Health - Occupational Stress Questionnaire    Feeling of Stress : Only a little  Social Connections: Socially Integrated (02/01/2023)   Social Connection and Isolation Panel [NHANES]    Frequency of Communication with Friends and Family: More than three  times a week    Frequency of Social Gatherings with Friends and Family: Twice a week    Attends Religious Services: More than 4 times per year    Active Member of [employer] or Organizations: Yes    Attends Engineer, structural: More than 4 times per year    Marital Status: Married   No Known Allergies Family History  Problem Relation Age of Onset   Arthritis Mother        rheumatoid   Hyperlipidemia Mother    Hypertension Mother    Rectal cancer Mother        38 years   Arthritis Father    Hypertension Father    Alzheimer's disease Father    Throat cancer Brother    Arthritis Maternal Grandmother    Arthritis Maternal Grandfather    Arthritis Paternal Grandmother    Arthritis Paternal Grandfather    Colon polyps Neg Hx     Esophageal cancer Neg Hx    Stomach cancer Neg Hx      Current Outpatient Medications (Cardiovascular):    atorvastatin (LIPITOR) 20 MG tablet, Take 1 tablet (20 mg total) by mouth daily.   colestipol (COLESTID) 1 g tablet, TAKE 1 TABLET BY MOUTH DAILY   ezetimibe (ZETIA) 10 MG tablet, Take 1 tablet (10 mg total) by mouth daily.   lisinopril (ZESTRIL) 40 MG tablet, Take 1 tablet (40 mg total) by mouth daily.   metoprolol succinate (TOPROL-XL) 50 MG 24 hr tablet, TAKE 1 TABLET BY MOUTH DAILY  WITH OR IMMEDIATELY FOLLOWING A  MEAL  Current Outpatient Medications (Respiratory):    cetirizine (ZYRTEC) 10 MG tablet, Take 5 mg by mouth daily.   fluticasone (FLONASE) 50 MCG/ACT nasal spray, Place 1 spray into both nostrils daily.  Current Outpatient Medications (Analgesics):    ibuprofen (ADVIL,MOTRIN) 200 MG tablet, Take 400 mg by mouth every 6 (six) hours as needed (pain).   meloxicam (MOBIC) 15 MG tablet, Take 1 tablet (15 mg total) by mouth daily.   Current Outpatient Medications (Other):    cholecalciferol (VITAMIN D) 1000 units tablet, Take 1,000 Units by mouth daily.   esomeprazole (NEXIUM) 20 MG capsule, 20 mg. Uses only PRN for flare ups.   famotidine (PEPCID) 40 MG tablet, Take 1 tablet (40 mg total) by mouth 2 (two) times daily. (Patient taking differently: Take 40 mg by mouth daily.)   Multiple Vitamin (MULTIVITAMIN) tablet, Take 1 tablet by mouth daily.   Omega-3 Fatty Acids (OMEGA-3 FISH OIL PO), Take 2,400 mg by mouth in the morning and at bedtime.   pantoprazole (PROTONIX) 40 MG tablet, Take 1 tablet (40 mg total) by mouth daily.   polycarbophil (FIBERCON) 625 MG tablet, Take 625 mg by mouth daily.   tamsulosin (FLOMAX) 0.4 MG CAPS capsule, Take 0.4 mg by mouth.   Turmeric 400 MG CAPS, Take by mouth as needed.   vitamin C (ASCORBIC ACID) 500 MG tablet, Take 500 mg by mouth daily.   Reviewed prior external information including notes and imaging from  primary care  provider As well as notes that were available from care everywhere and other healthcare systems.  Past medical history, social, surgical and family history all reviewed in electronic medical record.  No pertanent information unless stated regarding to the chief complaint.   Review of Systems:  No headache, visual changes, nausea, vomiting, diarrhea, constipation, dizziness, abdominal pain, skin rash, fevers, chills, night sweats, weight loss, swollen lymph nodes, b joint swelling, chest pain, shortness of  breath, mood changes. POSITIVE muscle aches, body aches  Objective  Blood pressure 132/82, pulse 75, height 5\' 10"  (1.778 m), weight 192 lb (87.1 kg), SpO2 99%.   General: No apparent distress alert and oriented x3 mood and affect normal, dressed appropriately.  HEENT: Pupils equal, extraocular movements intact  Respiratory: Patient's speak in full sentences and does not appear short of breath  Cardiovascular: No lower extremity edema, non tender, no erythema  Low back exam shows loss of lordosis noted.  Patient has difficulty getting out of a chair.  Weakness of the lower extremities.  Deep tendon reflexes are seem to be hyperintense with 3+ of the lower extremities of the Achilles bilaterally.  Does have weakness noted of the proximal muscles.  No bony muscle wasting noted.  Patient is a pain seems to be out of proportion to the amount of palpation. Weakness of the shoulders bilaterally.  Does have some swelling noted as well of the hands.  Seems to be diffuse.    Impression and Recommendations:    The above documentation has been reviewed and is accurate and complete Judi Saa, DO

## 2023-04-28 ENCOUNTER — Ambulatory Visit: Payer: Medicare Other | Admitting: Family Medicine

## 2023-04-28 ENCOUNTER — Encounter: Payer: Self-pay | Admitting: Family Medicine

## 2023-04-28 VITALS — BP 132/82 | HR 75 | Ht 70.0 in | Wt 192.0 lb

## 2023-04-28 DIAGNOSIS — M545 Low back pain, unspecified: Secondary | ICD-10-CM | POA: Diagnosis not present

## 2023-04-28 DIAGNOSIS — G8929 Other chronic pain: Secondary | ICD-10-CM | POA: Diagnosis not present

## 2023-04-28 DIAGNOSIS — M2550 Pain in unspecified joint: Secondary | ICD-10-CM | POA: Diagnosis not present

## 2023-04-28 DIAGNOSIS — M5136 Other intervertebral disc degeneration, lumbar region: Secondary | ICD-10-CM | POA: Diagnosis not present

## 2023-04-28 LAB — COMPREHENSIVE METABOLIC PANEL
ALT: 19 U/L (ref 0–53)
AST: 18 U/L (ref 0–37)
Albumin: 4.4 g/dL (ref 3.5–5.2)
Alkaline Phosphatase: 72 U/L (ref 39–117)
BUN: 14 mg/dL (ref 6–23)
CO2: 23 mEq/L (ref 19–32)
Calcium: 8.9 mg/dL (ref 8.4–10.5)
Chloride: 101 mEq/L (ref 96–112)
Creatinine, Ser: 0.94 mg/dL (ref 0.40–1.50)
GFR: 82.38 mL/min (ref >60.00)
Glucose, Bld: 93 mg/dL (ref 70–99)
Potassium: 4.1 mEq/L (ref 3.5–5.1)
Sodium: 135 mEq/L (ref 135–145)
Total Bilirubin: 0.6 mg/dL (ref 0.2–1.2)
Total Protein: 7.1 g/dL (ref 6.0–8.3)

## 2023-04-28 LAB — CBC WITH DIFFERENTIAL/PLATELET
Basophils Absolute: 0.1 10*3/uL (ref 0.0–0.1)
Basophils Relative: 1 % (ref 0.0–3.0)
Eosinophils Absolute: 0.3 10*3/uL (ref 0.0–0.7)
Eosinophils Relative: 5 % (ref 0.0–5.0)
HCT: 49.9 % (ref 39.0–52.0)
Hemoglobin: 17.2 g/dL — ABNORMAL HIGH (ref 13.0–17.0)
Lymphocytes Relative: 24.4 % (ref 12.0–46.0)
Lymphs Abs: 1.3 10*3/uL (ref 0.7–4.0)
MCHC: 34.4 g/dL (ref 30.0–36.0)
MCV: 91.8 fl (ref 78.0–100.0)
Monocytes Absolute: 0.5 10*3/uL (ref 0.1–1.0)
Monocytes Relative: 8.5 % (ref 3.0–12.0)
Neutro Abs: 3.3 10*3/uL (ref 1.4–7.7)
Neutrophils Relative %: 61.1 % (ref 43.0–77.0)
Platelets: 202 10*3/uL (ref 150.0–400.0)
RBC: 5.44 Mil/uL (ref 4.22–5.81)
RDW: 13.8 % (ref 11.5–15.5)
WBC: 5.5 10*3/uL (ref 4.0–10.5)

## 2023-04-28 LAB — IBC PANEL
Iron: 130 ug/dL (ref 42–165)
Saturation Ratios: 36.1 % (ref 20.0–50.0)
TIBC: 359.8 ug/dL (ref 250.0–450.0)
Transferrin: 257 mg/dL (ref 212.0–360.0)

## 2023-04-28 LAB — SEDIMENTATION RATE: Sed Rate: 14 mm/hr (ref 0–20)

## 2023-04-28 LAB — VITAMIN B12: Vitamin B-12: 417 pg/mL (ref 211–911)

## 2023-04-28 LAB — VITAMIN D 25 HYDROXY (VIT D DEFICIENCY, FRACTURES): VITD: 31.76 ng/mL (ref 30.00–100.00)

## 2023-04-28 LAB — URINALYSIS
Bilirubin Urine: NEGATIVE
Hgb urine dipstick: NEGATIVE
Ketones, ur: NEGATIVE
Leukocytes,Ua: NEGATIVE
Nitrite: NEGATIVE
Specific Gravity, Urine: <=1.005 — AB (ref 1.000–1.030)
Total Protein, Urine: NEGATIVE
Urine Glucose: NEGATIVE
Urobilinogen, UA: 0.2 (ref 0.0–1.0)
pH: 6 (ref 5.0–8.0)

## 2023-04-28 LAB — URIC ACID: Uric Acid, Serum: 6.1 mg/dL (ref 4.0–7.8)

## 2023-04-28 LAB — C-REACTIVE PROTEIN: CRP: <1 mg/dL (ref 0.5–20.0)

## 2023-04-28 LAB — CK: Total CK: 94 U/L (ref 7–232)

## 2023-04-28 LAB — FERRITIN: Ferritin: 32.6 ng/mL (ref 22.0–322.0)

## 2023-04-28 LAB — TSH: TSH: 0.94 u[IU]/mL (ref 0.35–5.50)

## 2023-04-28 LAB — PSA: PSA: 4.25 ng/mL — ABNORMAL HIGH (ref 0.10–4.00)

## 2023-04-28 LAB — TESTOSTERONE: Testosterone: 334.65 ng/dL (ref 300.00–890.00)

## 2023-04-28 MED ORDER — MELOXICAM 15 MG PO TABS: 15.0000 mg | ORAL_TABLET | Freq: Every day | ORAL | 0 refills | Status: DC

## 2023-04-28 NOTE — Assessment & Plan Note (Signed)
Patient does have weakness of the lower extremities and does have DTRs that are 3+ that is concerning for central impingement noted.  Concerned that this could be playing a significant role and have a nerve root impingement.  Would like advanced imaging at this point.  This is affecting definitely his wellbeing at the moment.  Follow-up again 6 to 8 weeks otherwise.  Will be talking to him sooner based on the imaging.  Patient knows if worsening symptoms to seek medical attention immediately.

## 2023-04-28 NOTE — Assessment & Plan Note (Signed)
Patient is having chronic pain that does not be out of proportion.  Concerned with patient's past medical history of prostatitis if this is potentially contributing.  Will check PSA as well as urinalysis.  In addition at this time patient is having a positive ANA previously and we will check this as well as sedimentation rate to see if anything else is high.  Polymyositis rheumatica is within the differential.  Patient does respond to anti-inflammatories but will change from ibuprofen to meloxicam to see if this will give him more benefit.  Warned of potential side effects to watch for any worsening of his GERD.  In addition of this with his significant low back pain and known degenerative changes I do feel an MRI is necessary at this time.  Will get an MRI to further evaluate for any nerve impingement that could be contributing and discussed treatment options thereafter

## 2023-04-28 NOTE — Patient Instructions (Addendum)
Labs today MRI lumbar [id number] Meloxicam 15mg   Discontinue IBU We will be in contact with you

## 2023-04-29 ENCOUNTER — Other Ambulatory Visit: Payer: Self-pay

## 2023-04-29 ENCOUNTER — Ambulatory Visit
Admission: RE | Admit: 2023-04-29 | Discharge: 2023-04-29 | Disposition: A | Discharge status: Home / Self Care | Payer: Medicare Other | Source: Ambulatory Visit | Attending: Family Medicine | Admitting: Family Medicine

## 2023-04-29 ENCOUNTER — Encounter: Payer: Self-pay | Admitting: Family Medicine

## 2023-04-29 DIAGNOSIS — M47816 Spondylosis without myelopathy or radiculopathy, lumbar region: Secondary | ICD-10-CM | POA: Diagnosis not present

## 2023-04-29 DIAGNOSIS — M5126 Other intervertebral disc displacement, lumbar region: Secondary | ICD-10-CM | POA: Diagnosis not present

## 2023-04-29 DIAGNOSIS — M545 Low back pain, unspecified: Secondary | ICD-10-CM

## 2023-04-29 MED ORDER — CIPROFLOXACIN HCL 500 MG PO TABS: 500.0000 mg | ORAL_TABLET | Freq: Two times a day (BID) | ORAL | 0 refills | Status: DC

## 2023-04-30 ENCOUNTER — Other Ambulatory Visit: Payer: Self-pay | Admitting: Family Medicine

## 2023-04-30 DIAGNOSIS — I10 Essential (primary) hypertension: Secondary | ICD-10-CM

## 2023-04-30 NOTE — Chronic Care Management (AMB) (Signed)
  Chronic Care Management   CCM RN Visit Note   Name: Adam Villanueva MRN: [id number] DOB: 01-10-1951  Subjective: Adam Villanueva is a 72 y.o. year old male who is a primary care patient of Burchette, Elberta Fortis, MD. The patient was referred to the Chronic Care Management team for assistance with care management needs subsequent to provider initiation of CCM services and plan of care.

## 2023-05-10 ENCOUNTER — Encounter: Payer: Self-pay | Admitting: Family Medicine

## 2023-05-10 ENCOUNTER — Other Ambulatory Visit: Payer: Self-pay | Admitting: Family Medicine

## 2023-05-10 DIAGNOSIS — M545 Low back pain, unspecified: Secondary | ICD-10-CM

## 2023-05-12 ENCOUNTER — Other Ambulatory Visit: Payer: Self-pay | Admitting: Family Medicine

## 2023-05-12 DIAGNOSIS — I10 Essential (primary) hypertension: Secondary | ICD-10-CM

## 2023-05-19 NOTE — Discharge Instructions (Signed)

## 2023-05-20 ENCOUNTER — Ambulatory Visit
Admission: RE | Admit: 2023-05-20 | Discharge: 2023-05-20 | Disposition: A | Discharge status: Home / Self Care | Payer: Medicare Other | Source: Ambulatory Visit | Attending: Family Medicine | Admitting: Family Medicine

## 2023-05-20 DIAGNOSIS — M545 Low back pain, unspecified: Secondary | ICD-10-CM

## 2023-05-20 DIAGNOSIS — M4727 Other spondylosis with radiculopathy, lumbosacral region: Secondary | ICD-10-CM | POA: Diagnosis not present

## 2023-05-20 MED ORDER — IOPAMIDOL (ISOVUE-M 200) INJECTION 41%
1.0000 mL | Freq: Once | INTRAMUSCULAR | Status: AC
  Administered 2023-05-20: 1 mL via EPIDURAL

## 2023-05-20 MED ORDER — METHYLPREDNISOLONE ACETATE 40 MG/ML INJ SUSP (RADIOLOG
80.0000 mg | Freq: Once | INTRAMUSCULAR | Status: AC
  Administered 2023-05-20: 80 mg via EPIDURAL

## 2023-05-22 ENCOUNTER — Encounter: Payer: Self-pay | Admitting: Family Medicine

## 2023-06-08 ENCOUNTER — Ambulatory Visit (INDEPENDENT_AMBULATORY_CARE_PROVIDER_SITE_OTHER): Payer: Medicare Other | Admitting: Family Medicine

## 2023-06-08 ENCOUNTER — Encounter: Payer: Self-pay | Admitting: Family Medicine

## 2023-06-08 VITALS — BP 142/80 | HR 73 | Temp 98.0°F | Ht 70.0 in | Wt 191.2 lb

## 2023-06-08 DIAGNOSIS — I10 Essential (primary) hypertension: Secondary | ICD-10-CM

## 2023-06-08 DIAGNOSIS — L570 Actinic keratosis: Secondary | ICD-10-CM

## 2023-06-08 DIAGNOSIS — E785 Hyperlipidemia, unspecified: Secondary | ICD-10-CM | POA: Diagnosis not present

## 2023-06-08 NOTE — Progress Notes (Signed)
Established Patient Office Visit  Subjective   Patient ID: Adam Villanueva, male    DOB: 1951-06-14  Age: 72 y.o. MRN: [id number]  Chief Complaint  Patient presents with   Head Injury    Patient complains of head injury, x2 years    HPI   Adam Villanueva has history of hypertension, GERD, osteoarthritis involving multiple sites, hyperlipidemia.  Seen today for the following items  He states he had head injury about 2 years ago.  He was working on something and stood up into a Office manager with small laceration injury mid forehead.  No sutures were placed.  Since that time he had scaly brownish skin lesion in the vicinity of where the wound was.  Nonhealing.  Sometimes slightly sore.  No history of skin cancer.  He has hyperlipidemia.  We added Zetia to his atorvastatin which takes about 3 times per week.  Tolerating well.  Has follow-up scheduled in October and her plans to get follow-up lipids then  He has hypertension treated with lisinopril 40 mg daily and Toprol-XL 50 mg daily.  Blood pressures at home consistently well-controlled (<130/80) compliant with medications.  Past Medical History:  Diagnosis Date   Allergy    Anxiety    Arthritis    Cataract    Chronic insomnia    GERD (gastroesophageal reflux disease)    Hyperlipidemia    Hypertension    Lower back pain    and mid back pain   Prostatitis    Sleep apnea    no cpap   Past Surgical History:  Procedure Laterality Date   COLONOSCOPY     POLYPECTOMY     SEPTOPLASTY     broken nose   WISDOM TOOTH EXTRACTION      reports that he has never smoked. He has never used smokeless tobacco. He reports current alcohol use. He reports that he does not use drugs. family history includes Alzheimer's disease in his father; Arthritis in his father, maternal grandfather, maternal grandmother, mother, paternal grandfather, and paternal grandmother; Hyperlipidemia in his mother; Hypertension in his father and mother; Rectal cancer in  his mother; Throat cancer in his brother. No Known Allergies  Review of Systems  Constitutional:  Negative for malaise/fatigue and weight loss.  Eyes:  Negative for blurred vision.  Respiratory:  Negative for shortness of breath.   Cardiovascular:  Negative for chest pain.  Neurological:  Negative for dizziness, weakness and headaches.      Objective:     BP (!) 142/80 (BP Location: Left Arm, Cuff Size: Normal)   Pulse 73   Temp 98 F (36.7 C) (Oral)   Ht 5\' 10"  (1.778 m)   Wt 191 lb 3.2 oz (86.7 kg)   SpO2 99%   BMI 27.43 kg/m  BP Readings from Last 3 Encounters:  06/08/23 (!) 142/80  05/20/23 (!) 155/78  04/28/23 132/82   Wt Readings from Last 3 Encounters:  06/08/23 191 lb 3.2 oz (86.7 kg)  04/28/23 192 lb (87.1 kg)  03/30/23 188 lb 12.8 oz (85.6 kg)      Physical Exam Vitals reviewed.  Constitutional:      Appearance: He is well-developed.  HENT:     Right Ear: External ear normal.     Left Ear: External ear normal.  Eyes:     Pupils: Pupils are equal, round, and reactive to light.  Neck:     Thyroid: No thyromegaly.  Cardiovascular:     Rate and Rhythm: Normal rate and  regular rhythm.  Pulmonary:     Effort: Pulmonary effort is normal. No respiratory distress.     Breath sounds: Normal breath sounds. No wheezing or rales.  Musculoskeletal:     Cervical back: Neck supple.  Skin:    Comments: Forehead reveals somewhat elongated brown scaly hyperkeratotic lesion.  Slightly more whitish and lighter in color on the inferior border.  No nodular changes.  No visible ulceration  Neurological:     Mental Status: He is alert and oriented to person, place, and time.      No results found for any visits on 06/08/23.  Last CBC Lab Results  Component Value Date   WBC 5.5 04/28/2023   HGB 17.2 (H) 04/28/2023   HCT 49.9 04/28/2023   MCV 91.8 04/28/2023   MCH 28.3 05/24/2015   RDW 13.8 04/28/2023   PLT 202.0 04/28/2023   Last metabolic panel Lab Results   Component Value Date   GLUCOSE 93 04/28/2023   NA 135 04/28/2023   K 4.1 04/28/2023   CL 101 04/28/2023   CO2 23 04/28/2023   BUN 14 04/28/2023   CREATININE 0.94 04/28/2023   GFR 82.38 04/28/2023   CALCIUM 8.9 04/28/2023   CALCIUM 8.9 04/28/2023   PROT 7.1 04/28/2023   ALBUMIN 4.4 04/28/2023   BILITOT 0.6 04/28/2023   ALKPHOS 72 04/28/2023   AST 18 04/28/2023   ALT 19 04/28/2023   Last lipids Lab Results  Component Value Date   CHOL 185 07/22/2022   HDL 33.10 (L) 07/22/2022   LDLCALC 114 (H) 03/04/2021   LDLDIRECT 94.0 07/22/2022   TRIG 372.0 (H) 07/22/2022   CHOLHDL 6 07/22/2022   Last hemoglobin A1c Lab Results  Component Value Date   HGBA1C 5.6 01/22/2016   Last thyroid functions Lab Results  Component Value Date   TSH 0.94 04/28/2023      The 10-year ASCVD risk score (Arnett DK, et al., 2019) is: 28%    Assessment & Plan:   #1 hypertension.  Probable whitecoat syndrome.  Blood pressures consistently well-controlled at home.  Did improve today some after rest.  Continue lisinopril and metoprolol.  Continue low-sodium diet.  Continue close monitoring.  #2 dyslipidemia.  Recent addition of Zetia to his atorvastatin.  We have encouraged him to take atorvastatin as many days a week as possible.  Recheck fasting lipids at follow-up in October  #3 probable actinic keratosis mid forehead.  Discussed sun protection.  We discussed risk and benefits of trial of liquid nitrogen including risk of pain, blistering, low risk of infection.  Patient consented.  We treated with liquid nitrogen he tolerated well.  Reassess at 1 month follow-up for physical.  If not resolving at that time consider biopsy  Evelena Peat, MD

## 2023-06-15 ENCOUNTER — Other Ambulatory Visit: Payer: Self-pay

## 2023-06-15 MED ORDER — MELOXICAM 15 MG PO TABS: 15.0000 mg | ORAL_TABLET | Freq: Every day | ORAL | 0 refills | Status: AC

## 2023-06-21 ENCOUNTER — Other Ambulatory Visit: Payer: Self-pay | Admitting: Family Medicine

## 2023-06-21 DIAGNOSIS — I10 Essential (primary) hypertension: Secondary | ICD-10-CM

## 2023-07-05 ENCOUNTER — Other Ambulatory Visit: Payer: Self-pay | Admitting: Physician Assistant

## 2023-07-21 ENCOUNTER — Other Ambulatory Visit: Payer: Self-pay | Admitting: Family Medicine

## 2023-07-21 DIAGNOSIS — I10 Essential (primary) hypertension: Secondary | ICD-10-CM

## 2023-07-26 ENCOUNTER — Encounter: Payer: Self-pay | Admitting: Family Medicine

## 2023-07-26 ENCOUNTER — Ambulatory Visit (INDEPENDENT_AMBULATORY_CARE_PROVIDER_SITE_OTHER): Payer: Medicare Other | Admitting: Family Medicine

## 2023-07-26 VITALS — BP 130/70 | HR 82 | Temp 97.6°F | Ht 70.0 in | Wt 191.1 lb

## 2023-07-26 DIAGNOSIS — I10 Essential (primary) hypertension: Secondary | ICD-10-CM | POA: Diagnosis not present

## 2023-07-26 DIAGNOSIS — Z23 Encounter for immunization: Secondary | ICD-10-CM | POA: Diagnosis not present

## 2023-07-26 DIAGNOSIS — E785 Hyperlipidemia, unspecified: Secondary | ICD-10-CM | POA: Diagnosis not present

## 2023-07-26 DIAGNOSIS — Z0000 Encounter for general adult medical examination without abnormal findings: Secondary | ICD-10-CM

## 2023-07-26 LAB — COMPREHENSIVE METABOLIC PANEL
ALT: 22 U/L (ref 0–53)
AST: 19 U/L (ref 0–37)
Albumin: 4.5 g/dL (ref 3.5–5.2)
Alkaline Phosphatase: 71 U/L (ref 39–117)
BUN: 11 mg/dL (ref 6–23)
CO2: 29 meq/L (ref 19–32)
Calcium: 9 mg/dL (ref 8.4–10.5)
Chloride: 99 meq/L (ref 96–112)
Creatinine, Ser: 0.94 mg/dL (ref 0.40–1.50)
GFR: 82.24 mL/min (ref >60.00)
Glucose, Bld: 98 mg/dL (ref 70–99)
Potassium: 4.3 meq/L (ref 3.5–5.1)
Sodium: 137 meq/L (ref 135–145)
Total Bilirubin: 0.6 mg/dL (ref 0.2–1.2)
Total Protein: 7 g/dL (ref 6.0–8.3)

## 2023-07-26 LAB — LIPID PANEL
Cholesterol: 153 mg/dL (ref 0–200)
HDL: 33.9 mg/dL — ABNORMAL LOW (ref >39.00)
Total CHOL/HDL Ratio: 5
Triglycerides: 430 mg/dL — ABNORMAL HIGH (ref 0.0–149.0)

## 2023-07-26 LAB — LDL CHOLESTEROL, DIRECT: Direct LDL: 52 mg/dL

## 2023-07-26 MED ORDER — EZETIMIBE 10 MG PO TABS: 10.0000 mg | ORAL_TABLET | Freq: Every day | ORAL | 3 refills | Status: AC

## 2023-07-26 MED ORDER — LISINOPRIL 40 MG PO TABS
40.0000 mg | ORAL_TABLET | Freq: Every day | ORAL | 3 refills | Status: DC
Start: 2023-07-26 — End: 2024-04-03

## 2023-07-26 MED ORDER — ATORVASTATIN CALCIUM 20 MG PO TABS: 20.0000 mg | ORAL_TABLET | Freq: Every day | ORAL | 3 refills | Status: AC

## 2023-07-26 NOTE — Addendum Note (Signed)
Addended by: Christy Sartorius on: 07/26/2023 10:11 AM   Modules accepted: Orders

## 2023-07-26 NOTE — Progress Notes (Signed)
Established Patient Office Visit  Subjective   Patient ID: Adam Villanueva, male    DOB: [birth year]  Age: 72 y.o. MRN: [id number]  Chief Complaint  Patient presents with   Annual Exam    HPI   Adam Villanueva is seen for annual physical exam.  He has history of hypertension, hyperlipidemia, GERD, osteoarthritis involving multiple sites, chronic insomnia.  Generally doing well.  He has been retired for 8 years.  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  Has been battling some back difficulties and had injection recently.  Multiple labs done through sports medicine which were reviewed with patient  Health maintenance reviewed:  Health Maintenance  Topic Date Due   DTaP/Tdap/Td (2 - Td or Tdap) 02/24/2023   INFLUENZA VACCINE  04/29/2023   COVID-19 Vaccine (5 - 2023-24 season) 05/30/2023   Medicare Annual Wellness (AWV)  07/02/2023   Colonoscopy  03/25/2028   Pneumonia Vaccine 94+ Years old  Completed   Hepatitis C Screening  Completed   Zoster Vaccines- Shingrix  Completed   HPV VACCINES  Aged Out   -Needs flu vaccine and consents at this time  Family history-mother had history of hyperlipidemia, hypertension, rectal cancer, and rheumatoid arthritis.  His father had Alzheimer's disease late 44s.  Brother died of complications of "throat "cancer-age 57.  He was a smoker..   Social history-has been married for 7 years.  Stepson from first marriage.  He is retired.  Worked with a Contractor center for 43 years.  No history of smoking.  No regular alcohol.  Past Medical History:  Diagnosis Date   Allergy    Anxiety    Arthritis    Cataract    Chronic insomnia    GERD (gastroesophageal reflux disease)    Hyperlipidemia    Hypertension    Lower back pain    and mid back pain   Prostatitis    Sleep apnea    no cpap   Past Surgical History:  Procedure Laterality Date   COLONOSCOPY     POLYPECTOMY     SEPTOPLASTY     broken nose   WISDOM TOOTH  EXTRACTION      reports that he has never smoked. He has never used smokeless tobacco. He reports current alcohol use. He reports that he does not use drugs. family history includes Alzheimer's disease in his father; Arthritis in his father, maternal grandfather, maternal grandmother, mother, paternal grandfather, and paternal grandmother; Hyperlipidemia in his mother; Hypertension in his father and mother; Rectal cancer in his mother; Throat cancer in his brother. No Known Allergies    Review of Systems  Constitutional:  Negative for chills, fever, malaise/fatigue and weight loss.  HENT:  Negative for hearing loss.   Eyes:  Negative for blurred vision and double vision.  Respiratory:  Negative for cough and shortness of breath.   Cardiovascular:  Negative for chest pain, palpitations and leg swelling.  Gastrointestinal:  Negative for abdominal pain, blood in stool, constipation and diarrhea.  Genitourinary:  Negative for dysuria.  Musculoskeletal:  Positive for back pain and joint pain.  Skin:  Negative for rash.  Neurological:  Negative for dizziness, speech change, seizures, loss of consciousness and headaches.  Psychiatric/Behavioral:  Negative for depression.       Objective:     BP 130/70 (BP Location: Left Arm, Patient Position: Sitting, Cuff Size: Normal)   Pulse 82   Temp 97.6 F (36.4 C) (Oral)   Ht  5\' 10"  (1.778 m)   Wt 191 lb 1.6 oz (86.7 kg)   SpO2 99%   BMI 27.42 kg/m  BP Readings from Last 3 Encounters:  07/26/23 130/70  06/08/23 (!) 142/80  05/20/23 (!) 155/78   Wt Readings from Last 3 Encounters:  07/26/23 191 lb 1.6 oz (86.7 kg)  06/08/23 191 lb 3.2 oz (86.7 kg)  04/28/23 192 lb (87.1 kg)      Physical Exam Vitals reviewed.  Constitutional:      General: He is not in acute distress.    Appearance: He is well-developed. He is not ill-appearing.  HENT:     Head: Normocephalic and atraumatic.     Right Ear: External ear normal.     Left Ear:  External ear normal.  Eyes:     Conjunctiva/sclera: Conjunctivae normal.     Pupils: Pupils are equal, round, and reactive to light.  Neck:     Thyroid: No thyromegaly.  Cardiovascular:     Rate and Rhythm: Normal rate and regular rhythm.     Heart sounds: Normal heart sounds. No murmur heard. Pulmonary:     Effort: No respiratory distress.     Breath sounds: No wheezing or rales.  Abdominal:     General: Bowel sounds are normal. There is no distension.     Palpations: Abdomen is soft.     Tenderness: There is no abdominal tenderness. There is no guarding or rebound.     Hernia: A hernia is present.     Comments: Small umbilical hernia which is soft and nontender  Musculoskeletal:     Cervical back: Normal range of motion and neck supple.     Right lower leg: No edema.     Left lower leg: No edema.  Lymphadenopathy:     Cervical: No cervical adenopathy.  Skin:    Findings: No rash.  Neurological:     Mental Status: He is alert and oriented to person, place, and time.     Cranial Nerves: No cranial nerve deficit.      No results found for any visits on 07/26/23.  Last CBC Lab Results  Component Value Date   WBC 5.5 04/28/2023   HGB 17.2 (H) 04/28/2023   HCT 49.9 04/28/2023   MCV 91.8 04/28/2023   MCH 28.3 05/24/2015   RDW 13.8 04/28/2023   PLT 202.0 04/28/2023   Last metabolic panel Lab Results  Component Value Date   GLUCOSE 93 04/28/2023   NA 135 04/28/2023   K 4.1 04/28/2023   CL 101 04/28/2023   CO2 23 04/28/2023   BUN 14 04/28/2023   CREATININE 0.94 04/28/2023   GFR 82.38 04/28/2023   CALCIUM 8.9 04/28/2023   CALCIUM 8.9 04/28/2023   PROT 7.1 04/28/2023   ALBUMIN 4.4 04/28/2023   BILITOT 0.6 04/28/2023   ALKPHOS 72 04/28/2023   AST 18 04/28/2023   ALT 19 04/28/2023   Last lipids Lab Results  Component Value Date   CHOL 185 07/22/2022   HDL 33.10 (L) 07/22/2022   LDLCALC 114 (H) 03/04/2021   LDLDIRECT 94.0 07/22/2022   TRIG 372.0 (H)  07/22/2022   CHOLHDL 6 07/22/2022   Last thyroid functions Lab Results  Component Value Date   TSH 0.94 04/28/2023   Last vitamin D Lab Results  Component Value Date   VD25OH 31.76 04/28/2023   Last vitamin B12 and Folate Lab Results  Component Value Date   VITAMINB12 417 04/28/2023      The 10-year ASCVD  risk score (Arnett DK, et al., 2019) is: 24.5%    Assessment & Plan:   Physical exam.  He has chronic problems as above above which are stable.  Does need refills of his Zetia and atorvastatin today.  -Set up future labs for today with lipid and CMP -He had other recent labs including CBC per sports medicine which were reviewed -Recommend flu vaccine and patient consents -He has history of elevated PSA and has scheduled follow-up with urology for that -We did discuss RSV vaccine and he will consider. -Colonoscopy up-to-date   No follow-ups on file.    Evelena Peat, MD

## 2023-07-27 ENCOUNTER — Encounter: Payer: Self-pay | Admitting: Family Medicine

## 2023-08-05 ENCOUNTER — Telehealth: Payer: Self-pay

## 2023-08-05 NOTE — Patient Outreach (Signed)
  Care Management   Outreach Note  08/05/2023 Name: Adam Villanueva MRN: [id number] DOB: 1951-01-15  An unsuccessful telephone outreach was attempted today to contact the patient about Care Management needs.    Follow Up Plan:  A HIPAA compliant phone message was left for the patient providing contact information and requesting a return call.     Katina [facility]  [facility], [facility] RN Care Manager Direct Dial: [phone] Website: [email or web address]

## 2023-08-06 DIAGNOSIS — R350 Frequency of micturition: Secondary | ICD-10-CM | POA: Diagnosis not present

## 2023-08-27 ENCOUNTER — Telehealth: Payer: Self-pay

## 2023-08-27 NOTE — Patient Outreach (Signed)
  Care Management   Outreach Note  08/27/2023 Name: Adam Villanueva MRN: [id number] DOB: May 09, 1951  An unsuccessful telephone outreach was attempted today to contact the patient about Care Management needs.     Follow Up Plan:  A HIPAA compliant phone message was left for the patient providing contact information and requesting a return call.     Katina [facility]  [facility], [place], [facility] RN Care Manager Direct Dial: [phone] Website: [email or web address]

## 2023-09-01 ENCOUNTER — Other Ambulatory Visit: Payer: Self-pay | Admitting: Physician Assistant

## 2023-09-02 ENCOUNTER — Other Ambulatory Visit: Payer: Self-pay

## 2023-09-02 NOTE — Patient Outreach (Signed)
  Care Management    09/02/2023 Name: Adam Villanueva MRN: [id number] DOB: 1951-03-16  Subjective: Adam Villanueva is a 72 y.o. year old male who is a primary care patient of Burchette, Elberta Fortis, MD. The Care Management team was consulted for assistance.       Third unsuccessful telephone outreach was attempted today. [name] was referred to the care management team for assistance with care management and care coordination. The care management team will gladly outreach at any time in the future if he is interested in receiving assistance.  Follow Up Plan:  No further follow up required   Katina [facility]  [facility], [facility] RN Care Manager Direct Dial: [phone] Website: [email or web address]

## 2023-11-10 NOTE — Telephone Encounter (Signed)
Continue with 2, until things settle down.  He can revert back to 1 when appropriate. Last year he had similar issues that resolved spontaneously.  We even moved forward with colonoscopy. Thanks, Dr. Marina Goodell

## 2023-11-11 ENCOUNTER — Encounter: Payer: Self-pay | Admitting: Family Medicine

## 2023-11-12 ENCOUNTER — Other Ambulatory Visit: Payer: Self-pay

## 2023-11-12 DIAGNOSIS — M545 Low back pain, unspecified: Secondary | ICD-10-CM

## 2023-11-18 ENCOUNTER — Other Ambulatory Visit: Payer: Medicare Other

## 2023-11-22 NOTE — Discharge Instructions (Signed)

## 2023-11-23 ENCOUNTER — Ambulatory Visit
Admission: RE | Admit: 2023-11-23 | Discharge: 2023-11-23 | Disposition: A | Discharge status: Home / Self Care | Payer: Medicare Other | Source: Ambulatory Visit | Attending: Family Medicine | Admitting: Family Medicine

## 2023-11-23 ENCOUNTER — Other Ambulatory Visit: Payer: Self-pay | Admitting: Family Medicine

## 2023-11-23 DIAGNOSIS — M4727 Other spondylosis with radiculopathy, lumbosacral region: Secondary | ICD-10-CM | POA: Diagnosis not present

## 2023-11-23 DIAGNOSIS — M545 Low back pain, unspecified: Secondary | ICD-10-CM

## 2023-11-23 DIAGNOSIS — I10 Essential (primary) hypertension: Secondary | ICD-10-CM

## 2023-11-23 MED ORDER — IOPAMIDOL (ISOVUE-M 200) INJECTION 41%
1.0000 mL | Freq: Once | INTRAMUSCULAR | Status: AC
  Administered 2023-11-23: 1 mL via EPIDURAL

## 2023-11-23 MED ORDER — METHYLPREDNISOLONE ACETATE 40 MG/ML INJ SUSP (RADIOLOG
80.0000 mg | Freq: Once | INTRAMUSCULAR | Status: AC
  Administered 2023-11-23: 80 mg via EPIDURAL

## 2023-12-02 DIAGNOSIS — H52203 Unspecified astigmatism, bilateral: Secondary | ICD-10-CM | POA: Diagnosis not present

## 2023-12-02 DIAGNOSIS — H2513 Age-related nuclear cataract, bilateral: Secondary | ICD-10-CM | POA: Diagnosis not present

## 2023-12-02 DIAGNOSIS — H5213 Myopia, bilateral: Secondary | ICD-10-CM | POA: Diagnosis not present

## 2023-12-02 DIAGNOSIS — H25013 Cortical age-related cataract, bilateral: Secondary | ICD-10-CM | POA: Diagnosis not present

## 2023-12-02 DIAGNOSIS — H25043 Posterior subcapsular polar age-related cataract, bilateral: Secondary | ICD-10-CM | POA: Diagnosis not present

## 2023-12-31 ENCOUNTER — Other Ambulatory Visit: Payer: Self-pay | Admitting: Family Medicine

## 2023-12-31 DIAGNOSIS — I10 Essential (primary) hypertension: Secondary | ICD-10-CM

## 2024-02-01 DIAGNOSIS — H25811 Combined forms of age-related cataract, right eye: Secondary | ICD-10-CM | POA: Diagnosis not present

## 2024-02-01 DIAGNOSIS — H25011 Cortical age-related cataract, right eye: Secondary | ICD-10-CM | POA: Diagnosis not present

## 2024-02-01 DIAGNOSIS — H25041 Posterior subcapsular polar age-related cataract, right eye: Secondary | ICD-10-CM | POA: Diagnosis not present

## 2024-02-01 DIAGNOSIS — H2511 Age-related nuclear cataract, right eye: Secondary | ICD-10-CM | POA: Diagnosis not present

## 2024-02-03 ENCOUNTER — Ambulatory Visit (INDEPENDENT_AMBULATORY_CARE_PROVIDER_SITE_OTHER): Admitting: Family Medicine

## 2024-02-03 ENCOUNTER — Encounter: Payer: Self-pay | Admitting: Family Medicine

## 2024-02-03 VITALS — BP 128/78 | HR 72 | Temp 98.2°F | Wt 192.0 lb

## 2024-02-03 DIAGNOSIS — L989 Disorder of the skin and subcutaneous tissue, unspecified: Secondary | ICD-10-CM

## 2024-02-03 NOTE — Progress Notes (Signed)
   Subjective:    Patient ID: Adam Villanueva, male    DOB: 17-Dec-1950, 73 y.o.   MRN: [id number]  HPI Here to check a lesion on the top of his head. He first noticed this while taking a shower several days ago. It is somewhat tender.    Review of Systems  Constitutional: Negative.   Respiratory: Negative.    Cardiovascular: Negative.        Objective:   Physical Exam Constitutional:      Appearance: Normal appearance.  Cardiovascular:     Rate and Rhythm: Normal rate and regular rhythm.     Pulses: Normal pulses.     Heart sounds: Normal heart sounds.  Pulmonary:     Effort: Pulmonary effort is normal.     Breath sounds: Normal breath sounds.  Skin:    Comments: There is a 8 mm lesion on the vertex of the scalp that is inflamed, it has a scabbed area in the center, and there is a rim of erythema around the periphery  Neurological:     Mental Status: He is alert.           Assessment & Plan:  Skin lesion. This appears to be either an inflamed seborrheic keratosis or a squamous cell cancer. We will refer him to Dermatology to evaluate. Corita Diego, MD

## 2024-02-15 DIAGNOSIS — H25042 Posterior subcapsular polar age-related cataract, left eye: Secondary | ICD-10-CM | POA: Diagnosis not present

## 2024-02-15 DIAGNOSIS — H25012 Cortical age-related cataract, left eye: Secondary | ICD-10-CM | POA: Diagnosis not present

## 2024-02-15 DIAGNOSIS — H25812 Combined forms of age-related cataract, left eye: Secondary | ICD-10-CM | POA: Diagnosis not present

## 2024-02-15 DIAGNOSIS — H2512 Age-related nuclear cataract, left eye: Secondary | ICD-10-CM | POA: Diagnosis not present

## 2024-03-30 ENCOUNTER — Other Ambulatory Visit: Payer: Self-pay | Admitting: Family Medicine

## 2024-03-30 DIAGNOSIS — I10 Essential (primary) hypertension: Secondary | ICD-10-CM

## 2024-04-11 ENCOUNTER — Encounter: Payer: Self-pay | Admitting: Family Medicine

## 2024-04-17 NOTE — Progress Notes (Signed)
 Ben Baby Stairs D.[facility] [address] [place] [address] Phone: [phone]   Assessment and Plan:     1. Bilateral elbow joint pain 2. Medial epicondylitis of left elbow 3. 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  My interpretation: No acute fracture or dislocation - Start prednisone  Dosepak - Use Voltaren gel topically over area of pain - Use Tylenol  500 to 1000 mg tablets 2-3 times a day for day-to-day pain relief - Patient did find meloxicam  helpful in the past for musculoskeletal pain, however he believes it caused GI upset.  Will not restart prescription NSAID course at this time - Start HEP for bilateral medial epicondylitis  15 additional minutes spent for educating Therapeutic Home Exercise Program.  This included exercises focusing on stretching, strengthening, with focus on eccentric aspects.   Long term goals include an improvement in range of motion, strength, endurance as well as avoiding reinjury. Patient's frequency would include in 1-2 times a day, 3-5 times a week for a duration of 6-12 weeks. Proper technique shown and discussed handout in great detail with ATC.  All questions were discussed and answered.    Pertinent previous records reviewed include none  Follow Up: 3 to 4 weeks for reevaluation.  Could consider wrist bracing versus physical therapy versus needling versus ECSWT   Subjective:   I, Claretha Schimke am a scribe for Dr. Leonce.   Chief Complaint: elbow pain   HPI:   04/18/2024 Patient is a 73 year old male with elbow pain. Patient states it has been off and on for a while. When his elbows are really bothering him he feeling it in his hands. It affects his grip. Has had stiffness in his neck/ shoulder. Pain doesn't really bother him  when he goes to sleep.   Duration? 1 plus years Did you have an Injury to cause this pain?no Taking Medication for pain? Ice, left over medication from his back Numbness or Tingling? yes Does the pain Radiate? Yes to fingers Altered gait or use? no ROM/ impairment of movement? no   Relevant Historical Information: Tennis elbow(10 years ago), back issues, neck issues  Additional pertinent review of systems negative.   Current Outpatient Medications:    methylPREDNISolone  (MEDROL  DOSEPAK) 4 MG TBPK tablet, Follow instructions on package., Disp: 21 tablet, Rfl: 0   atorvastatin  (LIPITOR) 20 MG tablet, Take 1 tablet (20 mg total) by mouth daily., Disp: 90 tablet, Rfl: 3   cetirizine  (ZYRTEC ) 10 MG tablet, Take 5 mg by mouth daily., Disp: , Rfl:    cholecalciferol (VITAMIN D ) 1000 units tablet, Take 1,000 Units by mouth daily., Disp: , Rfl:    colestipol  (COLESTID ) 1 g tablet, TAKE 1 TABLET BY MOUTH DAILY, Disp: 100 tablet, Rfl: 3   esomeprazole (NEXIUM) 20 MG capsule, 20 mg. Uses only PRN for flare ups., Disp: , Rfl:    ezetimibe  (ZETIA ) 10 MG tablet, Take 1 tablet (10 mg total) by mouth daily., Disp: 90 tablet, Rfl: 3   famotidine  (PEPCID ) 40 MG tablet, TAKE 1 TABLET BY MOUTH TWICE DAILY, Disp: 60 tablet, Rfl: 3   fluticasone  (FLONASE ) 50 MCG/ACT nasal spray, Place 1 spray into both nostrils daily., Disp: 16 g, Rfl: 0   ibuprofen (ADVIL,MOTRIN) 200 MG tablet, Take 400 mg by mouth every 6 (six) hours as needed (pain)., Disp: , Rfl:  lisinopril  (ZESTRIL ) 40 MG tablet, TAKE 1 TABLET BY MOUTH DAILY, Disp: 100 tablet, Rfl: 1   meloxicam  (MOBIC ) 15 MG tablet, Take 1 tablet (15 mg total) by mouth daily., Disp: 30 tablet, Rfl: 0   metoprolol  succinate (TOPROL -XL) 50 MG 24 hr tablet, TAKE 1 TABLET BY MOUTH DAILY  WITH OR IMMEDIATELY FOLLOWING A  MEAL, Disp: 100 tablet, Rfl: 1   Multiple Vitamin (MULTIVITAMIN) tablet, Take 1 tablet by mouth daily., Disp: , Rfl:    Omega-3 Fatty Acids (OMEGA-3  FISH OIL PO), Take 2,400 mg by mouth in the morning and at bedtime., Disp: , Rfl:    pantoprazole  (PROTONIX ) 40 MG tablet, TAKE 1 TABLET BY MOUTH DAILY, Disp: 100 tablet, Rfl: 1   polycarbophil (FIBERCON) 625 MG tablet, Take 625 mg by mouth daily., Disp: , Rfl:    tamsulosin (FLOMAX) 0.4 MG CAPS capsule, Take 0.4 mg by mouth., Disp: , Rfl:    Turmeric 400 MG CAPS, Take by mouth as needed., Disp: , Rfl:    vitamin C (ASCORBIC ACID) 500 MG tablet, Take 500 mg by mouth daily., Disp: , Rfl:    Objective:     Vitals:   04/18/24 1322  BP: 118/80  Pulse: 83  SpO2: 97%  Weight: 195 lb 6.4 oz (88.6 kg)  Height: 5' 10 (1.778 m)      Body mass index is 28.04 kg/m.    Physical Exam:    General: Appears well, no acute distress, nontoxic and pleasant Neck: FROM, no pain Neuro: sensation is intact distally with no deficits, strenghth is 5/5 in elbow flexors/extenders/supinator/pronators and wrist flexors/extensors Psych: no evidence of anxiety or depression  Bilateral elbow: No deformity, swelling or muscle wasting Normal Carrying angle ROM:0-140, supination and pronation 90 TTP medial epicondyle NTTP over triceps, ticeps tendon, olecranon, lat epicondyle,  antecubital fossa, biceps tendon, supinator, pronator Negative tinnels over cubital tunnel No pain with resisted wrist and middle digit extension  pain with resisted wrist flexion No pain with resisted supination Mild pain with resisted pronation Negative valgus stress Negative varus stress Negative milking maneuver    Electronically signed by:  Odis Mace D.[facility] 1:49 PM 04/18/24

## 2024-04-18 ENCOUNTER — Ambulatory Visit (INDEPENDENT_AMBULATORY_CARE_PROVIDER_SITE_OTHER)

## 2024-04-18 ENCOUNTER — Ambulatory Visit: Admitting: Sports Medicine

## 2024-04-18 VITALS — BP 118/80 | HR 83 | Ht 70.0 in | Wt 195.4 lb

## 2024-04-18 DIAGNOSIS — M7702 Medial epicondylitis, left elbow: Secondary | ICD-10-CM | POA: Diagnosis not present

## 2024-04-18 DIAGNOSIS — M25522 Pain in left elbow: Secondary | ICD-10-CM

## 2024-04-18 DIAGNOSIS — M7701 Medial epicondylitis, right elbow: Secondary | ICD-10-CM | POA: Diagnosis not present

## 2024-04-18 DIAGNOSIS — M25521 Pain in right elbow: Secondary | ICD-10-CM | POA: Diagnosis not present

## 2024-04-18 MED ORDER — METHYLPREDNISOLONE 4 MG PO TBPK: ORAL_TABLET | ORAL | 0 refills | Status: DC

## 2024-04-18 NOTE — Patient Instructions (Addendum)
 Prednisone  dose pack To [facility]. HEP for Golfer's elbow. Recommend that you get tennis elbow straps for each elbow at [facility] or [facility]. Use topical Voltaren gel. Follow up in 4 weeks.

## 2024-04-24 ENCOUNTER — Encounter: Payer: Self-pay | Admitting: Sports Medicine

## 2024-04-24 ENCOUNTER — Ambulatory Visit: Payer: Self-pay | Admitting: Sports Medicine

## 2024-04-25 NOTE — Telephone Encounter (Signed)
 Appointment canceled.

## 2024-05-05 ENCOUNTER — Ambulatory Visit: Admitting: Family Medicine

## 2024-05-05 VITALS — BP 128/80 | HR 91 | Temp 98.0°F | Wt 192.5 lb

## 2024-05-05 DIAGNOSIS — K429 Umbilical hernia without obstruction or gangrene: Secondary | ICD-10-CM

## 2024-05-05 DIAGNOSIS — I10 Essential (primary) hypertension: Secondary | ICD-10-CM | POA: Diagnosis not present

## 2024-05-05 DIAGNOSIS — L570 Actinic keratosis: Secondary | ICD-10-CM

## 2024-05-05 NOTE — Progress Notes (Signed)
 Established Patient Office Visit  Subjective   Patient ID: Adam Villanueva, male    DOB: 1951/06/03  Age: 73 y.o. MRN: [id number]  Chief Complaint  Patient presents with   Hair/Scalp Problem    HPI   Adam Villanueva is seen today for the following concerns  He has a sore area on his scalp which he first noticed about a week ago.  Similar place in the past for which he saw dermatologist and eventually went away.  Does try to use hats for sun protection.  Does not recall any specific injury though he wonders if he may have bumped his head on a board with some of his woodworking.  No headaches.  Umbilical hernia which we discussed at physical.  He has queried about whether he should get anything done about this.  No associated pain.  Not increasing in size.  He stays very active.  Hypertension treated with metoprolol  and lisinopril .  Initial blood pressure up some today but improved some after rest.  Generally well-controlled.  No recent headaches or chest pains.  He has had some late day malaise  Past Medical History:  Diagnosis Date   Allergy    Anxiety    Arthritis    Cataract    Chronic insomnia    GERD (gastroesophageal reflux disease)    Hyperlipidemia    Hypertension    Lower back pain    and mid back pain   Prostatitis    Sleep apnea    no cpap   Past Surgical History:  Procedure Laterality Date   COLONOSCOPY     POLYPECTOMY     SEPTOPLASTY     broken nose   WISDOM TOOTH EXTRACTION      reports that he has never smoked. He has never used smokeless tobacco. He reports current alcohol use. He reports that he does not use drugs. family history includes Alzheimer's disease in his father; Arthritis in his father, maternal grandfather, maternal grandmother, mother, paternal grandfather, and paternal grandmother; Hyperlipidemia in his mother; Hypertension in his father and mother; Rectal cancer in his mother; Throat cancer in his brother. No Known Allergies  Review of Systems   Constitutional:  Negative for malaise/fatigue.  Eyes:  Negative for blurred vision.  Respiratory:  Negative for shortness of breath.   Cardiovascular:  Negative for chest pain.  Neurological:  Negative for dizziness, weakness and headaches.      Objective:     BP 128/80 (BP Location: Left Arm, Cuff Size: Normal)   Pulse 91   Temp 98 F (36.7 C) (Oral)   Wt 192 lb 8 oz (87.3 kg)   SpO2 96%   BMI 27.62 kg/m  BP Readings from Last 3 Encounters:  05/05/24 128/80  04/18/24 118/80  02/03/24 128/78   Wt Readings from Last 3 Encounters:  05/05/24 192 lb 8 oz (87.3 kg)  04/18/24 195 lb 6.4 oz (88.6 kg)  02/03/24 192 lb (87.1 kg)      Physical Exam Constitutional:      Appearance: He is well-developed.  HENT:     Right Ear: External ear normal.     Left Ear: External ear normal.  Eyes:     Pupils: Pupils are equal, round, and reactive to light.  Neck:     Thyroid : No thyromegaly.  Cardiovascular:     Rate and Rhythm: Normal rate and regular rhythm.  Pulmonary:     Effort: Pulmonary effort is normal. No respiratory distress.     Breath  sounds: Normal breath sounds. No wheezing or rales.  Abdominal:     Comments: Small umbilical hernia which is soft and easily reducible and nontender.  Musculoskeletal:     Cervical back: Neck supple.  Skin:    Comments: Left parietal scalp just left of midline small raised whitish hyperkeratotic area about 2 x 2 mm.  No ulceration.  Neurological:     Mental Status: He is alert and oriented to person, place, and time.      No results found for any visits on 05/05/24.    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  Relatively small area.  We discussed that natural history is that this could evolve into squamous cell.  We have recommended treatment.  Patient consents to treatment with liquid nitrogen after discussing of risk including  pain, blistering, low risk of infection.  Area was treated without difficulty.  Patient will be in touch if this is not going away within the next few weeks.  Discussed importance of sun protection  #2 small umbilical hernia.  Currently asymptomatic.  Patient on the fence regarding surgery.  Will observe for now follow-up promptly for any pain or increase in size.  Currently this does not debilitate him from doing any physical activity  #3 hypertension.  Initial blood pressure reading up but improved some after rest.  Continue current regimen of lisinopril  and Toprol .  Wolm Scarlet, MD

## 2024-05-06 ENCOUNTER — Other Ambulatory Visit: Payer: Self-pay | Admitting: Family Medicine

## 2024-05-06 DIAGNOSIS — I10 Essential (primary) hypertension: Secondary | ICD-10-CM

## 2024-05-16 ENCOUNTER — Ambulatory Visit: Admitting: Sports Medicine

## 2024-06-11 ENCOUNTER — Other Ambulatory Visit: Payer: Self-pay | Admitting: Family Medicine

## 2024-06-11 DIAGNOSIS — I10 Essential (primary) hypertension: Secondary | ICD-10-CM

## 2024-06-16 ENCOUNTER — Other Ambulatory Visit: Payer: Self-pay | Admitting: Physician Assistant

## 2024-07-11 ENCOUNTER — Ambulatory Visit: Admitting: Family Medicine

## 2024-07-11 ENCOUNTER — Encounter: Payer: Self-pay | Admitting: Family Medicine

## 2024-07-11 VITALS — BP 146/70 | HR 83 | Temp 98.0°F | Wt 189.3 lb

## 2024-07-11 DIAGNOSIS — N410 Acute prostatitis: Secondary | ICD-10-CM

## 2024-07-11 MED ORDER — CIPROFLOXACIN HCL 500 MG PO TABS: 500.0000 mg | ORAL_TABLET | Freq: Two times a day (BID) | ORAL | 0 refills | Status: AC

## 2024-07-11 NOTE — Progress Notes (Signed)
 Established Patient Office Visit  Subjective   Patient ID: Adam Villanueva, male    DOB: 1951/04/19  Age: 73 y.o. MRN: [id number]  Chief Complaint  Patient presents with   Prostatitis    HPI   Adam Villanueva is seen for concern for recurrent prostatitis.  He had multiple bouts in the past and states his symptoms are exactly same.  He has vague pain in his testicular and perianal region.  Denied any fever.  Symptoms were worse yesterday.  No dysuria.  Does have history of reported BPH and takes tamsulosin though not consistently.  Denies any nausea or vomiting.  He sees urologist annually and is due for that follow-up soon.  Last colonoscopy was 03-26-2023  Past Medical History:  Diagnosis Date   Allergy    Anxiety    Arthritis    Cataract    Chronic insomnia    GERD (gastroesophageal reflux disease)    Hyperlipidemia    Hypertension    Lower back pain    and mid back pain   Prostatitis    Sleep apnea    no cpap   Past Surgical History:  Procedure Laterality Date   COLONOSCOPY     POLYPECTOMY     SEPTOPLASTY     broken nose   WISDOM TOOTH EXTRACTION      reports that he has never smoked. He has never used smokeless tobacco. He reports current alcohol use. He reports that he does not use drugs. family history includes Alzheimer's disease in his father; Arthritis in his father, maternal grandfather, maternal grandmother, mother, paternal grandfather, and paternal grandmother; Hyperlipidemia in his mother; Hypertension in his father and mother; Rectal cancer in his mother; Throat cancer in his brother. No Known Allergies  Review of Systems  Constitutional:  Negative for chills and fever.  Gastrointestinal:  Negative for abdominal pain, nausea and vomiting.  Genitourinary:  Negative for flank pain and hematuria.      Objective:     BP (!) 146/70   Pulse 83   Temp 98 F (36.7 C) (Oral)   Wt 189 lb 4.8 oz (85.9 kg)   SpO2 98%   BMI 27.16 kg/m  BP Readings from Last 3  Encounters:  07/11/24 (!) 146/70  05/05/24 128/80  04/18/24 118/80   Wt Readings from Last 3 Encounters:  07/11/24 189 lb 4.8 oz (85.9 kg)  05/05/24 192 lb 8 oz (87.3 kg)  04/18/24 195 lb 6.4 oz (88.6 kg)      Physical Exam Vitals reviewed.  Constitutional:      General: He is not in acute distress.    Appearance: He is not ill-appearing.  Cardiovascular:     Rate and Rhythm: Normal rate and regular rhythm.  Pulmonary:     Effort: Pulmonary effort is normal.     Breath sounds: Normal breath sounds. No wheezing or rales.  Neurological:     Mental Status: He is alert.      No results found for any visits on 07/11/24.    The 10-year ASCVD risk score (Arnett DK, et al., 2019) is: 28.3%    Assessment & Plan:   Probable acute prostatitis.  We recommended rectal exam to further assess but patient declines.  He states his symptoms are exactly the same as with previous prostatitis.  We did agree to start Cipro  500 mg twice daily for 10 days.  Warm sitz bath's.  Needs prompt follow-up if not improving over the next few days  Shatoya Roets,  MD

## 2024-08-02 ENCOUNTER — Encounter: Admitting: Family Medicine

## 2024-08-04 ENCOUNTER — Encounter: Payer: Self-pay | Admitting: Family Medicine

## 2024-08-04 ENCOUNTER — Ambulatory Visit (INDEPENDENT_AMBULATORY_CARE_PROVIDER_SITE_OTHER): Admitting: Family Medicine

## 2024-08-04 VITALS — BP 140/70 | HR 78 | Temp 98.0°F | Ht 70.08 in | Wt 190.8 lb

## 2024-08-04 DIAGNOSIS — Z0000 Encounter for general adult medical examination without abnormal findings: Secondary | ICD-10-CM | POA: Diagnosis not present

## 2024-08-04 DIAGNOSIS — K429 Umbilical hernia without obstruction or gangrene: Secondary | ICD-10-CM

## 2024-08-04 DIAGNOSIS — E785 Hyperlipidemia, unspecified: Secondary | ICD-10-CM | POA: Diagnosis not present

## 2024-08-04 DIAGNOSIS — I10 Essential (primary) hypertension: Secondary | ICD-10-CM | POA: Diagnosis not present

## 2024-08-04 DIAGNOSIS — Z23 Encounter for immunization: Secondary | ICD-10-CM | POA: Diagnosis not present

## 2024-08-04 LAB — LIPID PANEL
Cholesterol: 172 mg/dL (ref 0–200)
HDL: 31.3 mg/dL — ABNORMAL LOW (ref >39.00)
LDL Cholesterol: 70 mg/dL (ref 0–99)
NonHDL: 140.22
Total CHOL/HDL Ratio: 5
Triglycerides: 353 mg/dL — ABNORMAL HIGH (ref 0.0–149.0)
VLDL: 70.6 mg/dL — ABNORMAL HIGH (ref 0.0–40.0)

## 2024-08-04 LAB — CBC WITH DIFFERENTIAL/PLATELET
Basophils Absolute: 0.1 K/uL (ref 0.0–0.1)
Basophils Relative: 1.3 % (ref 0.0–3.0)
Eosinophils Absolute: 0.2 K/uL (ref 0.0–0.7)
Eosinophils Relative: 5.2 % — ABNORMAL HIGH (ref 0.0–5.0)
HCT: 49.8 % (ref 39.0–52.0)
Hemoglobin: 17.2 g/dL — ABNORMAL HIGH (ref 13.0–17.0)
Lymphocytes Relative: 24.3 % (ref 12.0–46.0)
Lymphs Abs: 1 K/uL (ref 0.7–4.0)
MCHC: 34.5 g/dL (ref 30.0–36.0)
MCV: 91.2 fl (ref 78.0–100.0)
Monocytes Absolute: 0.4 K/uL (ref 0.1–1.0)
Monocytes Relative: 9.5 % (ref 3.0–12.0)
Neutro Abs: 2.5 K/uL (ref 1.4–7.7)
Neutrophils Relative %: 59.7 % (ref 43.0–77.0)
Platelets: 206 K/uL (ref 150.0–400.0)
RBC: 5.46 Mil/uL (ref 4.22–5.81)
RDW: 13.2 % (ref 11.5–15.5)
WBC: 4.2 K/uL (ref 4.0–10.5)

## 2024-08-04 LAB — COMPREHENSIVE METABOLIC PANEL WITH GFR
ALT: 20 U/L (ref 0–53)
AST: 18 U/L (ref 0–37)
Albumin: 4.6 g/dL (ref 3.5–5.2)
Alkaline Phosphatase: 81 U/L (ref 39–117)
BUN: 10 mg/dL (ref 6–23)
CO2: 27 meq/L (ref 19–32)
Calcium: 9.1 mg/dL (ref 8.4–10.5)
Chloride: 100 meq/L (ref 96–112)
Creatinine, Ser: 1.18 mg/dL (ref 0.40–1.50)
GFR: 62.15 mL/min (ref >60.00)
Glucose, Bld: 87 mg/dL (ref 70–99)
Potassium: 4.3 meq/L (ref 3.5–5.1)
Sodium: 138 meq/L (ref 135–145)
Total Bilirubin: 0.7 mg/dL (ref 0.2–1.2)
Total Protein: 7.1 g/dL (ref 6.0–8.3)

## 2024-08-04 NOTE — Progress Notes (Signed)
 Established Patient Office Visit  Subjective   Patient ID: Adam Villanueva, male    DOB: June 20, 1951  Age: 73 y.o. MRN: [id number]  Chief Complaint  Patient presents with   Annual Exam    HPI    Adam Villanueva is seen today for physical exam.  He had recent prostatitis and ended up going to his urologist and is currently on 30-day course of Septra DS.  Symptoms are relatively stable.  He has chronic problems including hypertension, GERD, degenerative arthritis involving multiple sites, chronic insomnia, hyperlipidemia  He has umbilical hernia and would like to consider surgical consultation.  Occasionally has a bit of soreness but no severe pain.  Health maintenance reviewed:  Health Maintenance  Topic Date Due   DTaP/Tdap/Td (2 - Td or Tdap) 02/24/2023   Medicare Annual Wellness (AWV)  07/02/2023   Influenza Vaccine  04/28/2024   COVID-19 Vaccine (5 - 2025-26 season) 05/29/2024   Colonoscopy  03/25/2028   Pneumococcal Vaccine: 50+ Years  Completed   Hepatitis C Screening  Completed   Zoster Vaccines- Shingrix  Completed   Meningococcal B Vaccine  Aged Out   - Will not get further colonoscopy secondary to age -Would like to go ahead with flu vaccine today -Tetanus is due and he will check with coverage at local pharmacy -Pneumonia and Shingrix vaccines complete  Social history-married for 8 years.  He has a stepson from first marriage.  He retired a few years ago.  Worked previously for social research officer, government.  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  Stays active with church.  Non-smoker.  No regular alcohol.  Family history-mother had history of hyperlipidemia, hypertension, rectal cancer, and rheumatoid arthritis.  His father had Alzheimer's disease late 60s.  Brother died of complications of throat cancer-age 34.  He was a smoker.   Past Medical History:  Diagnosis Date   Allergy    Anxiety    Arthritis    Cataract    Chronic insomnia    GERD  (gastroesophageal reflux disease)    Hyperlipidemia    Hypertension    Lower back pain    and mid back pain   Prostatitis    Sleep apnea    no cpap   Past Surgical History:  Procedure Laterality Date   COLONOSCOPY     POLYPECTOMY     SEPTOPLASTY     broken nose   WISDOM TOOTH EXTRACTION      reports that he has never smoked. He has never used smokeless tobacco. He reports current alcohol use. He reports that he does not use drugs. family history includes Alzheimer's disease in his father; Arthritis in his father, maternal grandfather, maternal grandmother, mother, paternal grandfather, and paternal grandmother; Hyperlipidemia in his mother; Hypertension in his father and mother; Rectal cancer in his mother; Throat cancer in his brother. No Known Allergies  Review of Systems  Constitutional:  Negative for chills, fever, malaise/fatigue and weight loss.  HENT:  Negative for hearing loss.   Eyes:  Negative for blurred vision and double vision.  Respiratory:  Negative for cough and shortness of breath.   Cardiovascular:  Negative for chest pain, palpitations and leg swelling.  Gastrointestinal:  Negative for blood in stool, constipation and diarrhea.  Genitourinary:  Negative for dysuria.  Skin:  Negative for rash.  Neurological:  Negative for dizziness, speech change, seizures, loss of consciousness and headaches.  Psychiatric/Behavioral:  Negative for depression.       Objective:  BP (!) 140/70   Pulse 78   Temp 98 F (36.7 C) (Oral)   Ht 5' 10.08 (1.78 m)   Wt 190 lb 12.8 oz (86.5 kg)   SpO2 98%   BMI 27.32 kg/m  BP Readings from Last 3 Encounters:  08/04/24 (!) 140/70  07/11/24 (!) 146/70  05/05/24 128/80   Wt Readings from Last 3 Encounters:  08/04/24 190 lb 12.8 oz (86.5 kg)  07/11/24 189 lb 4.8 oz (85.9 kg)  05/05/24 192 lb 8 oz (87.3 kg)      Physical Exam Vitals reviewed.  Constitutional:      General: He is not in acute distress.    Appearance:  He is well-developed. He is not ill-appearing.  HENT:     Head: Normocephalic and atraumatic.     Right Ear: External ear normal.     Left Ear: External ear normal.  Eyes:     Conjunctiva/sclera: Conjunctivae normal.     Pupils: Pupils are equal, round, and reactive to light.  Neck:     Thyroid : No thyromegaly.  Cardiovascular:     Rate and Rhythm: Normal rate and regular rhythm.     Heart sounds: Normal heart sounds. No murmur heard. Pulmonary:     Effort: No respiratory distress.     Breath sounds: No wheezing or rales.  Abdominal:     General: Bowel sounds are normal. There is no distension.     Palpations: Abdomen is soft.     Tenderness: There is no abdominal tenderness. There is no guarding or rebound.     Comments: Small umbilical hernia which is nontender to palpation.  Musculoskeletal:     Cervical back: Normal range of motion and neck supple.     Right lower leg: No edema.     Left lower leg: No edema.  Lymphadenopathy:     Cervical: No cervical adenopathy.  Skin:    Findings: No rash.  Neurological:     Mental Status: He is alert and oriented to person, place, and time.     Cranial Nerves: No cranial nerve deficit.      No results found for any visits on 08/04/24.    The 10-year ASCVD risk score (Arnett DK, et al., 2019) is: 26.5%    Assessment & Plan:   Problem List Items Addressed This Visit       Unprioritized   Umbilical hernia without obstruction and without gangrene   Relevant Orders   Ambulatory referral to General Surgery   Essential hypertension   Hyperlipidemia - Primary   Relevant Orders   Lipid panel   CMP   Other Visit Diagnoses       Physical exam       Relevant Orders   CBC with Differential/Platelet     Patient here today for physical exam.  He has hypertension hyperlipidemia which are treated with lisinopril  and atorvastatin .  Due for follow-up labs.  We discussed the following health maintenance items  -Follow-up labs  including CBC, CMP, lipid panel - Recommend flu vaccine and patient consents - Needs Tdap.  He will check on insurance coverage and consider at local pharmacy - General Surgery referral for his umbilical hernia.  He is considering repair. - We discussed RSV vaccine.  Recommend age 60 since he is relatively low risk  No follow-ups on file.    Wolm Scarlet, MD

## 2024-08-04 NOTE — Patient Instructions (Signed)
 Consider Tdap vaccine at some point this year and can get at local pharmacy.

## 2024-08-06 ENCOUNTER — Ambulatory Visit: Payer: Self-pay | Admitting: Family Medicine

## 2024-09-13 ENCOUNTER — Other Ambulatory Visit: Payer: Self-pay | Admitting: Family Medicine

## 2024-09-13 DIAGNOSIS — I10 Essential (primary) hypertension: Secondary | ICD-10-CM

## 2024-10-18 ENCOUNTER — Encounter: Admitting: Family Medicine

## 2024-10-18 ENCOUNTER — Ambulatory Visit

## 2024-11-03 ENCOUNTER — Encounter: Payer: Self-pay | Admitting: Family Medicine

## 2024-11-03 ENCOUNTER — Ambulatory Visit: Admitting: Family Medicine

## 2024-11-03 VITALS — BP 118/66 | HR 90 | Temp 98.6°F | Wt 194.5 lb

## 2024-11-03 DIAGNOSIS — E785 Hyperlipidemia, unspecified: Secondary | ICD-10-CM

## 2024-11-03 DIAGNOSIS — I10 Essential (primary) hypertension: Secondary | ICD-10-CM

## 2024-11-03 DIAGNOSIS — L570 Actinic keratosis: Secondary | ICD-10-CM

## 2024-11-03 DIAGNOSIS — R972 Elevated prostate specific antigen [PSA]: Secondary | ICD-10-CM | POA: Insufficient documentation

## 2024-11-03 NOTE — Progress Notes (Signed)
 "  Established Patient Office Visit  Subjective   Patient ID: Adam Villanueva, male    DOB: 31-Jan-1951  Age: 74 y.o. MRN: [id number]  Chief Complaint  Patient presents with   Skin Problem    HPI    Adam Villanueva has history of hypertension, elevated PSA, hyperlipidemia, chronic insomnia, GERD.  He is followed actively by urology and states that he needs referral because of his insurance to be seen by them.  He already has established care.  Last PSA reportedly 6.6.  They are contemplating possible biopsy.  He has history of actinic keratoses which have been treated with liquid nitrogen previously.  He noticed couple of new areas on his frontal scalp recently.  Would like to have these treated.  He is very conscious of sun protection with hats.  He has hypertension treated with metoprolol  XL and lisinopril  40 mg daily.  Blood pressure has been stable.  No recent dizziness.  Takes atorvastatin  and Zetia  for hyperlipidemia.  Had lipids in November with total cholesterol 172 and LDL of 70.  Tolerating medications well with no myalgias.  Past Medical History:  Diagnosis Date   Allergy    Anxiety    Arthritis    Cataract    Chronic insomnia    GERD (gastroesophageal reflux disease)    Hyperlipidemia    Hypertension    Lower back pain    and mid back pain   Prostatitis    Sleep apnea    no cpap   Past Surgical History:  Procedure Laterality Date   COLONOSCOPY     POLYPECTOMY     SEPTOPLASTY     broken nose   WISDOM TOOTH EXTRACTION      reports that he has never smoked. He has never used smokeless tobacco. He reports current alcohol use. He reports that he does not use drugs. family history includes Alzheimer's disease in his father; Arthritis in his father, maternal grandfather, maternal grandmother, mother, paternal grandfather, and paternal grandmother; Hyperlipidemia in his mother; Hypertension in his father and mother; Rectal cancer in his mother; Throat cancer in his  brother. Allergies[1]   Review of Systems  Constitutional:  Negative for malaise/fatigue.  Eyes:  Negative for blurred vision.  Respiratory:  Negative for shortness of breath.   Cardiovascular:  Negative for chest pain.  Neurological:  Negative for dizziness, weakness and headaches.      Objective:     BP 118/66   Pulse 90   Temp 98.6 F (37 C) (Oral)   Wt 194 lb 8 oz (88.2 kg)   SpO2 95%   BMI 27.84 kg/m  BP Readings from Last 3 Encounters:  11/03/24 118/66  08/04/24 (!) 140/70  07/11/24 (!) 146/70   Wt Readings from Last 3 Encounters:  11/03/24 194 lb 8 oz (88.2 kg)  08/04/24 190 lb 12.8 oz (86.5 kg)  07/11/24 189 lb 4.8 oz (85.9 kg)      Physical Exam Vitals reviewed.  Constitutional:      General: He is not in acute distress.    Appearance: He is well-developed.  HENT:     Right Ear: External ear normal.     Left Ear: External ear normal.  Eyes:     Pupils: Pupils are equal, round, and reactive to light.  Neck:     Thyroid : No thyromegaly.  Cardiovascular:     Rate and Rhythm: Normal rate and regular rhythm.  Pulmonary:     Effort: Pulmonary effort is normal. No respiratory  distress.     Breath sounds: Normal breath sounds. No wheezing or rales.  Musculoskeletal:     Cervical back: Neck supple.  Skin:    Comments: He has a couple of hyperkeratotic slightly raised scaly lesions on his frontal scalp anteriorly.  He has 1 near the parietal scalp that is very similar.  No ulceration.  All these lesions are about 2 x 3 mm in dimension.  Neurological:     Mental Status: He is alert and oriented to person, place, and time.      No results found for any visits on 11/03/24.  Last CBC Lab Results  Component Value Date   WBC 4.2 08/04/2024   HGB 17.2 (H) 08/04/2024   HCT 49.8 08/04/2024   MCV 91.2 08/04/2024   MCH 28.3 05/24/2015   RDW 13.2 08/04/2024   PLT 206.0 08/04/2024   Last metabolic panel Lab Results  Component Value Date   GLUCOSE 87  08/04/2024   NA 138 08/04/2024   K 4.3 08/04/2024   CL 100 08/04/2024   CO2 27 08/04/2024   BUN 10 08/04/2024   CREATININE 1.18 08/04/2024   GFR 62.15 08/04/2024   CALCIUM  9.1 08/04/2024   PROT 7.1 08/04/2024   ALBUMIN 4.6 08/04/2024   BILITOT 0.7 08/04/2024   ALKPHOS 81 08/04/2024   AST 18 08/04/2024   ALT 20 08/04/2024   Last lipids Lab Results  Component Value Date   CHOL 172 08/04/2024   HDL 31.30 (L) 08/04/2024   LDLCALC 70 08/04/2024   LDLDIRECT 52.0 07/26/2023   TRIG 353.0 (H) 08/04/2024   CHOLHDL 5 08/04/2024      The 89-bzjm ASCVD risk score (Arnett DK, et al., 2019) is: 22%    Assessment & Plan:   #1 actinic keratoses.  Treated successfully previously with liquid nitrogen.  Patient requesting the same today.  He is aware of risk of pain, blistering, low risk of infection and scarring.  Patient consented.  We treated a total of 3 lesions with liquid nitrogen and he tolerated well.  #2 history of elevated PSA.  Followed by urology.  Patient is needing referral to urology for insurance purposes.  He already has established care there.  #3 hypertension stable and at goal on metoprolol  and lisinopril .  Continue low-sodium diet.  #4 hyperlipidemia treated with Zetia  and atorvastatin .  Recent lipids reviewed and stable.  Continue low saturated fat diet  Wolm Scarlet, MD     [1] No Known Allergies  "

## 2024-11-03 NOTE — Patient Instructions (Signed)
 Salmon, mackeral, sardines, walnuts, pecans, pumpkin seeds, flaxseed, fish oil
